# Patient Record
Sex: Female | Born: 1950
Health system: Southern US, Community
[De-identification: ages and names within clinical notes are randomized; demographics above are authoritative.]

## PROBLEM LIST (undated history)

## (undated) DIAGNOSIS — F419 Anxiety disorder, unspecified: Secondary | ICD-10-CM

## (undated) DIAGNOSIS — IMO0002 Reserved for concepts with insufficient information to code with codable children: Secondary | ICD-10-CM

## (undated) DIAGNOSIS — K259 Gastric ulcer, unspecified as acute or chronic, without hemorrhage or perforation: Secondary | ICD-10-CM

## (undated) DIAGNOSIS — E785 Hyperlipidemia, unspecified: Secondary | ICD-10-CM

## (undated) DIAGNOSIS — M810 Age-related osteoporosis without current pathological fracture: Secondary | ICD-10-CM

## (undated) DIAGNOSIS — K219 Gastro-esophageal reflux disease without esophagitis: Secondary | ICD-10-CM

## (undated) DIAGNOSIS — T884XXA Failed or difficult intubation, initial encounter: Secondary | ICD-10-CM

## (undated) HISTORY — PX: ABDOMINAL HYSTERECTOMY: SHX81

## (undated) HISTORY — DX: Reserved for concepts with insufficient information to code with codable children: IMO0002

## (undated) HISTORY — DX: Age-related osteoporosis without current pathological fracture: M81.0

## (undated) HISTORY — PX: PLEURAL SCARIFICATION: SHX748

## (undated) HISTORY — PX: TONSILECTOMY/ADENOIDECTOMY WITH MYRINGOTOMY: SHX6125

## (undated) HISTORY — PX: OTHER SURGICAL HISTORY: SHX169

## (undated) HISTORY — DX: Anxiety disorder, unspecified: F41.9

## (undated) HISTORY — DX: Hyperlipidemia, unspecified: E78.5

## (undated) HISTORY — DX: Gastric ulcer, unspecified as acute or chronic, without hemorrhage or perforation: K25.9

## (undated) HISTORY — DX: Gastro-esophageal reflux disease without esophagitis: K21.9

---

## 1998-04-25 ENCOUNTER — Other Ambulatory Visit: Admission: RE | Admit: 1998-04-25 | Discharge: 1998-04-25 | Payer: Self-pay | Admitting: *Deleted

## 1998-06-11 ENCOUNTER — Other Ambulatory Visit: Admission: RE | Admit: 1998-06-11 | Discharge: 1998-06-11 | Payer: Self-pay | Admitting: *Deleted

## 1998-07-23 ENCOUNTER — Other Ambulatory Visit: Admission: RE | Admit: 1998-07-23 | Discharge: 1998-07-23 | Payer: Self-pay | Admitting: Obstetrics and Gynecology

## 1998-10-29 ENCOUNTER — Other Ambulatory Visit: Admission: RE | Admit: 1998-10-29 | Discharge: 1998-10-29 | Payer: Self-pay | Admitting: Obstetrics and Gynecology

## 1998-12-06 ENCOUNTER — Emergency Department (HOSPITAL_COMMUNITY): Admission: EM | Admit: 1998-12-06 | Discharge: 1998-12-06 | Payer: Self-pay

## 1999-02-05 ENCOUNTER — Other Ambulatory Visit: Admission: RE | Admit: 1999-02-05 | Discharge: 1999-02-05 | Payer: Self-pay | Admitting: *Deleted

## 1999-05-03 ENCOUNTER — Other Ambulatory Visit: Admission: RE | Admit: 1999-05-03 | Discharge: 1999-05-03 | Payer: Self-pay | Admitting: *Deleted

## 1999-11-04 ENCOUNTER — Other Ambulatory Visit: Admission: RE | Admit: 1999-11-04 | Discharge: 1999-11-04 | Payer: Self-pay | Admitting: *Deleted

## 2000-05-04 ENCOUNTER — Other Ambulatory Visit: Admission: RE | Admit: 2000-05-04 | Discharge: 2000-05-04 | Payer: Self-pay | Admitting: Gynecology

## 2001-06-14 ENCOUNTER — Other Ambulatory Visit: Admission: RE | Admit: 2001-06-14 | Discharge: 2001-06-14 | Payer: Self-pay | Admitting: Obstetrics and Gynecology

## 2002-06-29 ENCOUNTER — Other Ambulatory Visit: Admission: RE | Admit: 2002-06-29 | Discharge: 2002-06-29 | Payer: Self-pay | Admitting: Obstetrics and Gynecology

## 2003-07-05 ENCOUNTER — Other Ambulatory Visit: Admission: RE | Admit: 2003-07-05 | Discharge: 2003-07-05 | Payer: Self-pay | Admitting: Obstetrics and Gynecology

## 2003-09-23 DIAGNOSIS — K52831 Collagenous colitis: Secondary | ICD-10-CM | POA: Insufficient documentation

## 2004-07-05 ENCOUNTER — Other Ambulatory Visit: Admission: RE | Admit: 2004-07-05 | Discharge: 2004-07-05 | Payer: Self-pay | Admitting: Obstetrics and Gynecology

## 2004-07-16 ENCOUNTER — Encounter: Admission: RE | Admit: 2004-07-16 | Discharge: 2004-07-16 | Payer: Self-pay | Admitting: Internal Medicine

## 2004-09-22 HISTORY — PX: APPENDECTOMY: SHX54

## 2004-09-27 ENCOUNTER — Encounter: Admission: RE | Admit: 2004-09-27 | Discharge: 2004-09-27 | Payer: Self-pay | Admitting: Gastroenterology

## 2005-03-29 ENCOUNTER — Observation Stay (HOSPITAL_COMMUNITY): Admission: EM | Admit: 2005-03-29 | Discharge: 2005-03-30 | Payer: Self-pay | Admitting: Emergency Medicine

## 2005-03-29 ENCOUNTER — Encounter (INDEPENDENT_AMBULATORY_CARE_PROVIDER_SITE_OTHER): Payer: Self-pay | Admitting: Specialist

## 2005-03-29 ENCOUNTER — Ambulatory Visit (HOSPITAL_COMMUNITY): Admission: RE | Admit: 2005-03-29 | Discharge: 2005-03-29 | Payer: Self-pay | Admitting: Family Medicine

## 2005-07-10 ENCOUNTER — Other Ambulatory Visit: Admission: RE | Admit: 2005-07-10 | Discharge: 2005-07-10 | Payer: Self-pay | Admitting: Obstetrics and Gynecology

## 2007-08-30 ENCOUNTER — Ambulatory Visit (HOSPITAL_COMMUNITY): Admission: RE | Admit: 2007-08-30 | Discharge: 2007-08-31 | Payer: Self-pay | Admitting: Orthopaedic Surgery

## 2008-12-31 ENCOUNTER — Emergency Department (HOSPITAL_BASED_OUTPATIENT_CLINIC_OR_DEPARTMENT_OTHER): Admission: EM | Admit: 2008-12-31 | Discharge: 2008-12-31 | Payer: Self-pay | Admitting: Emergency Medicine

## 2009-05-16 ENCOUNTER — Encounter: Admission: RE | Admit: 2009-05-16 | Discharge: 2009-05-16 | Payer: Self-pay | Admitting: Gastroenterology

## 2010-10-14 ENCOUNTER — Encounter: Payer: Self-pay | Admitting: Gastroenterology

## 2010-11-29 ENCOUNTER — Other Ambulatory Visit: Payer: Self-pay | Admitting: Obstetrics and Gynecology

## 2011-02-04 NOTE — Op Note (Signed)
NAME:  [name], [name]                   ACCOUNT NO.:  [id number]   MEDICAL RECORD NO.:  [id number]          PATIENT TYPE:  AMB   LOCATION:  SDS                          FACILITY:  [facility]   PHYSICIAN:  Mark C. Ophelia Charter, M.D.    DATE OF BIRTH:  1951-07-31   DATE OF PROCEDURE:  08/30/2007  DATE OF DISCHARGE:                               OPERATIVE REPORT   PREOPERATIVE DIAGNOSES:  1. Cervical spondylosis, C5-6.  2. Left wrist ganglion.   POSTOPERATIVE DIAGNOSES:  1. Cervical spondylosis, C5-6.  2. Left wrist ganglion.   PROCEDURES:  1. C5-6 anterior cervical diskectomy and fusion.  2. Left iliac crest autograft.  3. Left wrist ganglion excision.   SURGEON:  Mark C. Ophelia Charter, MD   ASSISTANT:  Maud Deed, PA-C   ANESTHESIA:  GOT plus Marcaine local.   PROCEDURE:  After the induction of general anesthesia, a 2-pound sandbag  behind the neck, gel bag behind the iliac crest, arms tucked to the  side, the neck and iliac crest were prepped with DuraPrep.  Head halter  traction had been applied without weight.  The area was squared with  towels and a sterile skin marker was used prior to Betadine Vi-Drape  application.  Iliac crest was squared with towels and covered with Vi-  Drape, a sterile Mayo stand at the head, thyroid sheet and drape.  Incision was made starting at the midline after a time-out procedure.  The patient received preoperative Ancef prophylaxis.  Incision started  in the midline extending to the left in a skin fold.  She did not have a  really prominent skin fold.  Platysma was divided line with the fibers.  Blunt dissection down just below the omohyoid with a needle placed in  the expected C5-6 level, which turned out to be C6-7.  Looking above, we  did identify the omohyoid at that time, slipped above it, took another x-  ray and confirmed the needle was at C5-6, and then proceed with the  microdiskectomy using the operating microscope, drilling with a Cloward  drill,  first with a 10, then a 12-mm drill, back to posterior cortex.  Trimming removed the bone with a small shelf to prevent posterior  migration.  The operating microscope was used for visualization.  Microdissection techniques were used to take down the posterior  longitudinal ligament.  There were bone spurs in both gutters, some  overhang of C5 over C6 posteriorly, which was causing some displacement  of the dura but no extruded fragments.  Once spurs were removed and the  dura was completely decompressed, uncovertebral joints were stripped.  A  small incision was made over the inferior aspect of the left iliac crest  and fascia was split in line with the fibers and a Cobb was used to  clean this up over the periosteum end.  A 14-mm plug was harvested,  bullet-nosed slightly, based on depth gauge measurements and then  inserted.  A small portion superior on the right had some cortical bone  slightly prominent.  This was trimmed back with a Frankey Poot  rongeur.  The  plug was stable.  After irrigation with saline solution, a Hemovac was  placed through a separate stab incision, in-and-out technique in line  with the skin incision, 3-0 Vicryl in the subcutaneous tissue, followed  by 4-0 Vicryl subcuticular.  Tincture of Benzoin, Steri-Strips, 4x4 and  tape were applied.  Iliac crest closed with 0 Vicryl, 2-0 Vicryl and 4-0  Vicryl in the skin.  Tincture of Benzoin, Steri-Strips, Marcaine  infiltration of both areas.  Arm was then placed on an arm board and the  left proximal arm tourniquet was applied.  Blood pressure cuff was  switched to the opposite right arm in exchange for the tourniquet.  Arm  was prepped with DuraPrep, extremity and sheet drape were applied, time-  out procedure was repeated again, and there was no reason to repeat the  antibiotics since it had just been 2 hours.  Arm was wrapped with an  Esmarch, tourniquet inflated.  Incision was made transversely over the  primary  ganglion, which was in the snuffbox.  EPL tendon was identified,  and the ganglion extended down from the dorsal capsule.  Initial skin  was spread in the midline and then blunt retractors placed to avoid  damaging any of the dorsal radial sensory branches.  A small portion of  the ganglion came out of the base of the wrist capsule, which was  removed.  Wound was irrigated, tourniquet deflated.  One small little  vein was coagulated.  Radial artery was not visualized in the field.  Closure was with 4-0 subcuticular Vicryl.  Tincture of Benzoin, Steri-  Strips, 4x4s, Webril and Ace wrap postoperatively.  Instrument count and  needle count was correct.  The patient was transferred to the recovery  room in stable condition.      Mark C. Ophelia Charter, M.D.  Electronically Signed     MCY/MEDQ  D:  08/30/2007  T:  08/31/2007  Job:  [id number]

## 2011-02-07 NOTE — H&P (Signed)
[name], [name]                   ACCOUNT NO.:  [id number]   MEDICAL RECORD NO.:  [id number]          PATIENT TYPE:  OBV   LOCATION:  0101                         FACILITY:  [facility]   PHYSICIAN:  Angelia Mould. Derrell Lolling, M.D.DATE OF BIRTH:  [birth year]   DATE OF ADMISSION:  03/29/2005  DATE OF DISCHARGE:                                HISTORY & PHYSICAL   CHIEF COMPLAINT:  Abdominal pain.   HISTORY OF PRESENT ILLNESS:  This is a 60 year old white female sent to me  by the courtesy of Dr. Tracey Harries for evaluation of probable appendicitis.   The patient states that she developed abdominal pain 48 hours ago. This was  burning and cramping in nature and not particularly localized. She had  minimal nausea. Twenty-four hours ago the nausea had resolved but she was  more tender and noticed the pain more on the right side in the right lower  quadrant. She has not had any fever or chills. She denies diarrhea or  alteration in her bowel habits. She was able to eat a little bit of  breakfast this morning. She states that her pain is a little bit better this  morning but she still has significant pain. She saw Dr. Tracey Harries  yesterday. He started her on antibiotics yesterday. He did a pelvic exam but  did not find anything. A CT scan was done at [facility] today which shows a  thickened, inflamed appendix consistent with a non ruptured appendicitis. I  was asked to see her at that point.   Notably her past history is that she had a screening colonoscopy about 8 or  12 months ago with Dr. Sharrell Ku and was told that she had colitis.  She was asymptomatic at that point. Five months ago she had some abdominal  pain and was placed on Asacol for 2 weeks and her symptoms resolved. She has  not required any further intervention. She does not have any urologic  problems. Has not had any hepatic problems.   She is admitted for management of her presumed appendicitis.   PAST MEDICAL HISTORY:  1.   Laparoscopy in 1988 by Dr. Teodora Medici when she was told she had      endometriosis.  2.  Colonoscopy 8 to 12 months ago.  3.  Tonsillectomy at age 41.  4.  Right pneumothorax in 1994. At that time she was abusing crack cocaine.      She has been drug free since that time. She does not know, but thinks      that she might have fallen.  5.  She had surgery on her right thumb for a trigger finger.  6.  She has had some reflux problems but nothing major.   CURRENT MEDICATIONS:  Birth control pills. Multivitamins. Some type of  antibiotics but just for 24 hours.   ALLERGIES:  No known drug allergies.   SOCIAL HISTORY:  She lives in [place], is married. They have no children.  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 Denies the use of alcohol or tobacco. She  used crack cocaine but has been drug free for 11 years.   FAMILY HISTORY:  Mother living, has history of breast cancer, is doing well.  Father deceased, had myelodysplasia and probable leukemia. On brother living  with hypertension.   REVIEW OF SYSTEMS:  All systems reviewed. They are noncontributory except as  described above. It is notable that she had her last menstrual period last  week.   PHYSICAL EXAMINATION:  GENERAL:  A thin, pleasant middle-aged woman in no  distress. She is a little bit anxious but very cooperative and appropriate.  VITAL SIGNS:  Temperature 97.5, pulse 78, respirations 16, blood pressure  156/86.  EYES:  Sclerae clear. Extraocular movements are intact.  EARS/NOSE/MOUTH/THROAT:  Nose, lips, tongue and oropharynx are without  lesions.  NECK:  Supple, nontender. No mass.  LUNGS:  Clear to auscultation. No chest wall tenderness.  BREASTS:  Not examined.  HEART:  Regular rate and rhythm, no murmur. Radial, femoral and posterior  tibial pulses are palpable. No peripheral edema.  ABDOMEN:  Soft. Hypoactive bowel sounds. Not distended. Liver and  spleen not  enlarged. No palpable mass. Tender with some guarding in the right lower  quadrant but no rebound tenderness.  GENITOURINARY:  No inguinal adenopathy or hernia.  PELVIC EXAM:  Not repeated.  EXTREMITIES:  She moves all four extremities well without pain or deformity.  NEUROLOGIC:  No gross motor or sensory deficits.   ADMISSION DATA:  White blood cell count is 11,900, hemoglobin 13.8.  Urinalysis unremarkable. Complete metabolic panel unremarkable.   ASSESSMENT:  1.  Acute appendicitis.  2.  Remote history of crack cocaine abuse.  3.  Nonspecific colitis by history.   PLAN:  1.  The patient will need to go to the operating room for diagnostic      laparoscopy and appendectomy.  2.  I have discussed the indications and details of surgery with the patient      and her husband. I have told them that the differential diagnosis      includes appendicitis, malignancy of the appendix, Crohn's disease, or      diverticulitis in general. They seem to understand these issues well. I      told them that the risk and complications of surgery are bleeding,      infection, conversion to open laparotomy, injury to adjacent organs with      major reconstructive      surgery, wound problems such as infection or hernia, cardiac or      pulmonary  and thromboembolic problems. They seem to understand these      issues well. At this time all their questions were answered. They are      understanding and willing to go ahead with this procedure.       HMI/MEDQ  D:  03/29/2005  T:  03/29/2005  Job:  [id number]   cc:   Griffith Citron, M.D.  [facility] [place]  [place] [address]  Fax: [phone]   Leatha Gilding. Mezer, M.D.  [address]  [place]  [place] [address]  Fax: [phone]   Tracey Harries, M.D.  [address]  [place]  [place] [address]  Fax: [phone]

## 2011-02-07 NOTE — Op Note (Signed)
NAMEJONELLE, [name]                   ACCOUNT NO.:  [id number]   MEDICAL RECORD NO.:  [id number]          PATIENT TYPE:  OBV   LOCATION:  0101                         FACILITY:  [facility]   PHYSICIAN:  Angelia Mould. Derrell Lolling, M.D.DATE OF BIRTH:  [birth year]   DATE OF PROCEDURE:  DATE OF DISCHARGE:                                 OPERATIVE REPORT   PREOPERATIVE DIAGNOSIS:  Acute appendicitis.   POSTOPERATIVE DIAGNOSIS:  Acute appendicitis.   OPERATION PERFORMED:  Laparoscopic appendectomy.   SURGEON:  Angelia Mould. Derrell Lolling, M.D.   OPERATIVE INDICATIONS:  This is a 60 year old white female who has a 48-hour  history of abdominal pain which has become more localized to the right lower  quadrant. This has not been very progressive and she actually states she  feels a little bit better today then yesterday. There has been minimal  nausea and no vomiting and no change in her bowel habits. She was started on  antibiotics last night empirically by Dr. Tracey Harries. A CT scan was done  this morning at [facility] and showed a thickened inflamed appendix but no  evidence of rupture. She is brought to operating room following the CT scan.   FINDINGS:  The appendix appeared to be acutely inflamed in its midportion  and distal portion. It was a little bit bumpy in appearance on the distal  tip but this did not look like a tumor. It was fairly stuck to the cecum but  I was able to tease this off with blunt dissection and carefully using the  harmonic scalpel. The right colon looked normal. The last three or four feet  of the terminal ileum looked normal. The liver looked normal. The uterus and  both ovaries looked normal although there was some evidence of endometriosis  on the posterior wall of the bladder.  I took a picture of this, a few small  spots of endometriosis consistent with a past history.   OPERATIVE TECHNIQUE:  Following induction of general endotracheal anesthesia  the patient was identified.  The abdomen was prepped and draped in sterile  fashion following insertion of a Foley catheter. Intravenous antibiotics  were given, 0.5% Marcaine with epinephrine was used as local infiltration  anesthetic. An 11-mm OptiVu port was inserted in the left paramedian area  just above the level of the umbilicus. This insertion was atraumatic.  Pneumoperitoneum was created.  The video camera was inserted the entire  abdomen and pelvis was surveyed with findings as described above. There was  no evidence of injury from the trocar. A 5 mm trocar was placed in the left  mid abdomen and 12 mm trocar placed in the left suprapubic area.   With the patient positioned we were able to identify the base of the  appendix, the body of the appendix and the tip of the appendix curving  laterally. We had to use the harmonic scalpel to carefully divide the  lateral peritoneal attachments to mobilize this medially. The tip of the  appendix was elevated and we had to very carefully dissect this away from  the cecum with blunt spreading dissection but that went well and the  appendix remained intact. We took down the appendiceal artery and  appendiceal mesentery using the harmonic scalpel until we had the appendix  completely skeletonized. We then inserted an Endo-GIA stapler, placed this  transversely across the base of the appendix at the level the cecum, closed  the stapler, held in place for 45 seconds, and fired it and removed it. The  appendix was placed in the specimen bag and removed from the abdominal  cavity.  Staple line was inspected and looked quite good. The operative field was  then irrigated a little bit but there was no bleeding and no significant  purulence or exudate. All the irrigation fluid was removed.   The trocars were removed under direct vision. There was no bleeding from  trocar sites. The pneumoperitoneum was released. The fascia and the  suprapubic port was closed with zero Vicryl  sutures. Incisions were  irrigated with saline and skin closed with subcuticular sutures of 4-0  Monocryl and Steri-Strips. Clean bandages were placed and the patient taken  recovery room in stable condition. Estimated blood loss was about 10 mL.   COMPLICATIONS:  None.   Sponge and instruments counts were correct.       HMI/MEDQ  D:  03/29/2005  T:  03/29/2005  Job:  [id number]   cc:   Griffith Citron, M.D.  [facility] [place]  [place] [address]  Fax: [phone]   Leatha Gilding. Mezer, M.D.  [address]  [place]  [place] [address]  Fax: [phone]   Tracey Harries, M.D.  [address]  [place]  [place] [address]  Fax: [phone]

## 2011-06-30 LAB — COMPREHENSIVE METABOLIC PANEL
ALT: 19
AST: 23
CO2: 25
Chloride: 105
GFR calc Af Amer: >60
GFR calc non Af Amer: >60
Sodium: 138
Total Bilirubin: 0.5

## 2011-06-30 LAB — DIFFERENTIAL
Basophils Absolute: 0.1
Eosinophils Absolute: 0.2
Eosinophils Relative: 2
Lymphs Abs: 3
Monocytes Absolute: 0.8

## 2011-06-30 LAB — URINALYSIS, ROUTINE W REFLEX MICROSCOPIC
Glucose, UA: NEGATIVE
Hgb urine dipstick: NEGATIVE
Protein, ur: NEGATIVE
pH: 7

## 2011-06-30 LAB — CBC
RBC: 4.56
WBC: 9.7

## 2011-06-30 LAB — PROTIME-INR: Prothrombin Time: 13

## 2011-08-29 ENCOUNTER — Ambulatory Visit (INDEPENDENT_AMBULATORY_CARE_PROVIDER_SITE_OTHER): Payer: No Typology Code available for payment source

## 2011-08-29 DIAGNOSIS — E7800 Pure hypercholesterolemia, unspecified: Secondary | ICD-10-CM

## 2011-09-02 ENCOUNTER — Ambulatory Visit: Payer: Self-pay | Admitting: Emergency Medicine

## 2011-10-10 ENCOUNTER — Ambulatory Visit (INDEPENDENT_AMBULATORY_CARE_PROVIDER_SITE_OTHER): Payer: No Typology Code available for payment source

## 2011-10-10 DIAGNOSIS — R5383 Other fatigue: Secondary | ICD-10-CM

## 2011-10-10 DIAGNOSIS — R110 Nausea: Secondary | ICD-10-CM

## 2012-06-07 ENCOUNTER — Ambulatory Visit (INDEPENDENT_AMBULATORY_CARE_PROVIDER_SITE_OTHER): Payer: No Typology Code available for payment source | Admitting: Family Medicine

## 2012-06-07 VITALS — BP 110/60 | HR 83 | Temp 97.9°F | Resp 16 | Ht 64.0 in | Wt 122.0 lb

## 2012-06-07 DIAGNOSIS — L0390 Cellulitis, unspecified: Secondary | ICD-10-CM

## 2012-06-07 DIAGNOSIS — Z0000 Encounter for general adult medical examination without abnormal findings: Secondary | ICD-10-CM

## 2012-06-07 DIAGNOSIS — Z23 Encounter for immunization: Secondary | ICD-10-CM

## 2012-06-07 DIAGNOSIS — L0291 Cutaneous abscess, unspecified: Secondary | ICD-10-CM

## 2012-06-07 MED ORDER — DOXYCYCLINE HYCLATE 100 MG PO TABS: 100.0000 mg | ORAL_TABLET | Freq: Two times a day (BID) | ORAL | Status: DC

## 2012-06-07 NOTE — Progress Notes (Signed)
61 yo woman with 24 hours of itchy red rash right dorsal and ulnar hand.  She has been using cortaid all day today to control the itching.  She suffered a painful bee sting 8 days ago.  Cannot  Bend pinky finger easily  O:  Red and slightly swollen MCP joint area with FROM.  Assessment:  Cellulitis right lateral hand

## 2013-09-20 ENCOUNTER — Encounter: Payer: No Typology Code available for payment source | Admitting: Emergency Medicine

## 2013-09-27 ENCOUNTER — Ambulatory Visit (INDEPENDENT_AMBULATORY_CARE_PROVIDER_SITE_OTHER): Payer: PRIVATE HEALTH INSURANCE | Admitting: Emergency Medicine

## 2013-09-27 ENCOUNTER — Encounter: Payer: Self-pay | Admitting: Emergency Medicine

## 2013-09-27 VITALS — BP 126/70 | HR 60 | Temp 98.5°F | Resp 16 | Ht 63.5 in | Wt 118.2 lb

## 2013-09-27 DIAGNOSIS — Z23 Encounter for immunization: Secondary | ICD-10-CM

## 2013-09-27 DIAGNOSIS — R9431 Abnormal electrocardiogram [ECG] [EKG]: Secondary | ICD-10-CM | POA: Insufficient documentation

## 2013-09-27 DIAGNOSIS — E785 Hyperlipidemia, unspecified: Secondary | ICD-10-CM

## 2013-09-27 DIAGNOSIS — F17200 Nicotine dependence, unspecified, uncomplicated: Secondary | ICD-10-CM

## 2013-09-27 DIAGNOSIS — Z0000 Encounter for general adult medical examination without abnormal findings: Secondary | ICD-10-CM

## 2013-09-27 LAB — CBC WITH DIFFERENTIAL/PLATELET
BASOS ABS: 0 10*3/uL (ref 0.0–0.1)
BASOS PCT: 0 % (ref 0–1)
EOS ABS: 0.1 10*3/uL (ref 0.0–0.7)
EOS PCT: 2 % (ref 0–5)
HCT: 40.2 % (ref 36.0–46.0)
Hemoglobin: 13.5 g/dL (ref 12.0–15.0)
Lymphocytes Relative: 30 % (ref 12–46)
Lymphs Abs: 2.1 10*3/uL (ref 0.7–4.0)
MCH: 28 pg (ref 26.0–34.0)
MCHC: 33.6 g/dL (ref 30.0–36.0)
MCV: 83.2 fL (ref 78.0–100.0)
Monocytes Absolute: 0.7 10*3/uL (ref 0.1–1.0)
Monocytes Relative: 10 % (ref 3–12)
Neutro Abs: 4.2 10*3/uL (ref 1.7–7.7)
Neutrophils Relative %: 58 % (ref 43–77)
PLATELETS: 346 10*3/uL (ref 150–400)
RBC: 4.83 MIL/uL (ref 3.87–5.11)
RDW: 14.3 % (ref 11.5–15.5)
WBC: 7.2 10*3/uL (ref 4.0–10.5)

## 2013-09-27 LAB — POCT URINALYSIS DIPSTICK
Bilirubin, UA: NEGATIVE
Glucose, UA: NEGATIVE
Ketones, UA: NEGATIVE
Nitrite, UA: NEGATIVE
PROTEIN UA: NEGATIVE
RBC UA: NEGATIVE
SPEC GRAV UA: 1.01
UROBILINOGEN UA: 0.2
pH, UA: 7

## 2013-09-27 MED ORDER — ZOSTER VACCINE LIVE 19400 UNT/0.65ML ~~LOC~~ SOLR: 0.6500 mL | Freq: Once | SUBCUTANEOUS | Status: DC

## 2013-09-27 NOTE — Progress Notes (Signed)
   Subjective:    Patient ID: [name], female    DOB: 08-05-1951, 63 y.o.   MRN: [id number]  HPI    Review of Systems  Constitutional: Negative.   HENT: Negative.   Eyes: Negative.   Respiratory: Negative.   Cardiovascular: Negative.   Gastrointestinal: Negative.   Endocrine: Negative.   Genitourinary: Positive for frequency and dyspareunia.  Musculoskeletal: Negative.   Skin: Negative.   Allergic/Immunologic: Negative.   Neurological: Negative.   Hematological: Negative.   Psychiatric/Behavioral: Positive for sleep disturbance.       Objective:   Physical Exam HEENT exam is unremarkable. Her neck is supple. Her chest is clear to auscultation and percussion. Heart is a regular rate without murmurs rubs or gallops. The abdomen is soft liver and spleen are not enlarged no areas of tenderness no masses are felt. Skin is normal without lesions seen. Extremities without cyanosis clubbing or edema. Neurological intact. Breast exam reveals no masses Results for orders placed in visit on 09/27/13  POCT URINALYSIS DIPSTICK      Result Value Range   Color, UA yellow     Clarity, UA clear     Glucose, UA neg     Bilirubin, UA neg     Ketones, UA neg     Spec Grav, UA 1.010     Blood, UA neg     pH, UA 7.0     Protein, UA neg     Urobilinogen, UA 0.2     Nitrite, UA neg     Leukocytes, UA Trace           Assessment & Plan:  Routine labs were done. She is due to see her GYN specialist in March. She has regular mammograms done. Will order a carotid Doppler since her brother just had a stroke secondary to carotid stenosis . She has a family history of breast cancer.

## 2013-09-28 LAB — COMPLETE METABOLIC PANEL WITH GFR
ALT: 22 U/L (ref 0–35)
AST: 23 U/L (ref 0–37)
Albumin: 4.5 g/dL (ref 3.5–5.2)
Alkaline Phosphatase: 78 U/L (ref 39–117)
BILIRUBIN TOTAL: 0.5 mg/dL (ref 0.3–1.2)
BUN: 14 mg/dL (ref 6–23)
CO2: 29 meq/L (ref 19–32)
CREATININE: 0.61 mg/dL (ref 0.50–1.10)
Calcium: 9.7 mg/dL (ref 8.4–10.5)
Chloride: 100 mEq/L (ref 96–112)
GFR, Est Non African American: >89 mL/min
Glucose, Bld: 89 mg/dL (ref 70–99)
Potassium: 4.8 mEq/L (ref 3.5–5.3)
Sodium: 137 mEq/L (ref 135–145)
Total Protein: 7.5 g/dL (ref 6.0–8.3)

## 2013-09-28 LAB — LIPID PANEL
CHOLESTEROL: 220 mg/dL — AB (ref 0–200)
HDL: 71 mg/dL (ref >39)
LDL Cholesterol: 132 mg/dL — ABNORMAL HIGH (ref 0–99)
TRIGLYCERIDES: 83 mg/dL (ref <150)
Total CHOL/HDL Ratio: 3.1 Ratio
VLDL: 17 mg/dL (ref 0–40)

## 2013-09-28 LAB — TSH: TSH: 2.066 u[IU]/mL (ref 0.350–4.500)

## 2013-12-29 ENCOUNTER — Ambulatory Visit
Admission: RE | Admit: 2013-12-29 | Discharge: 2013-12-29 | Disposition: A | Discharge status: Home / Self Care | Payer: PRIVATE HEALTH INSURANCE | Source: Ambulatory Visit | Attending: Emergency Medicine | Admitting: Emergency Medicine

## 2013-12-29 DIAGNOSIS — F17200 Nicotine dependence, unspecified, uncomplicated: Secondary | ICD-10-CM

## 2014-01-04 ENCOUNTER — Other Ambulatory Visit: Payer: Self-pay

## 2014-07-10 ENCOUNTER — Ambulatory Visit (INDEPENDENT_AMBULATORY_CARE_PROVIDER_SITE_OTHER): Payer: PRIVATE HEALTH INSURANCE | Admitting: Physician Assistant

## 2014-07-10 VITALS — BP 126/74 | HR 69 | Temp 98.2°F | Resp 16 | Ht 63.5 in | Wt 121.0 lb

## 2014-07-10 DIAGNOSIS — H6123 Impacted cerumen, bilateral: Secondary | ICD-10-CM

## 2014-07-10 DIAGNOSIS — R0982 Postnasal drip: Secondary | ICD-10-CM

## 2014-07-10 DIAGNOSIS — R05 Cough: Secondary | ICD-10-CM

## 2014-07-10 DIAGNOSIS — R059 Cough, unspecified: Secondary | ICD-10-CM

## 2014-07-10 MED ORDER — IPRATROPIUM BROMIDE 0.03 % NA SOLN: 2.0000 | Freq: Two times a day (BID) | NASAL | Status: DC

## 2014-07-10 NOTE — Progress Notes (Signed)
Subjective:    Patient ID: [name], female    DOB: 1950/09/28, 63 y.o.   MRN: [id number]  Cough Pertinent negatives include no chills, ear pain, fever, sore throat, shortness of breath or wheezing. There is no history of environmental allergies.    This is a 63 year old woman presenting with a cough x 3 weeks. She reports three weeks ago she had a "normal cold". She was experiencing sore throat, nasal congestion and cough. Her sore throat and nasal congestion resolved after 3 days, however her cough has lingered. The cough is dry. The cough is worse in the evening and in the mornings. In the mornings she feels there is mucus stuck in her throat. The cough is not keeping her awake at night. She reports her voice feels tired. She tried mucinex one day but otherwise has not tried anything. She denies fever, chills, wheezing or shortness of breath. Tried mucinex one day. She does not have asthma. She is a former smoker - quit 18 years ago. She does not have environmental allergies. She reports the globus sensation and tired voice are symptoms she has had in the past with acid reflux induced cough. This is somewhat different though - before her symptoms were aggravated with laying down and now her symptoms are relieved with laying down.   She reports her ears frequently get "stopped up". She uses a warm washcloth to clean her ears, no q-tips. She feels she has a hard time hearing sometimes.  Review of Systems  Constitutional: Negative for fever and chills.  HENT: Positive for hearing loss. Negative for congestion, ear pain and sore throat.   Eyes: Negative for discharge and itching.  Respiratory: Positive for cough. Negative for shortness of breath and wheezing.   Skin: Negative.   Allergic/Immunologic: Negative for environmental allergies.   Current Outpatient Prescriptions on File Prior to Visit  Medication Sig Dispense Refill  . aspirin 81 MG tablet Take 81 mg by mouth daily.      .  Multiple Vitamins-Minerals (MULTIVITAMIN WITH MINERALS) tablet Take 1 tablet by mouth daily.      . Omega-3 Fatty Acids (FISH OIL) 1000 MG CAPS Take by mouth.      Marland Kitchen omeprazole (PRILOSEC) 20 MG capsule Take 20 mg by mouth daily.      Marland Kitchen zoster vaccine live, PF, (ZOSTAVAX) 56387 UNT/0.65ML injection Inject 19,400 Units into the skin once.  1 each  0   No current facility-administered medications on file prior to visit.   Patient Active Problem List   Diagnosis Date Noted  . Nonspecific abnormal electrocardiogram (ECG) (EKG) 09/27/2013      Objective:   Physical Exam  Constitutional: She is oriented to person, place, and time. She appears well-developed and well-nourished. No distress.  HENT:  Head: Normocephalic and atraumatic.  Right Ear: Hearing normal.  Left Ear: Hearing normal.  Nose: Nose normal.  Mouth/Throat: Uvula is midline and mucous membranes are normal. Posterior oropharyngeal erythema present. No oropharyngeal exudate.  Bilateral TMs blocked by cerumen. Ear lavage with colace performed. After lavage, canals and TMs clear.  Eyes: Conjunctivae and lids are normal. Right eye exhibits no discharge. Left eye exhibits no discharge. No scleral icterus.  Cardiovascular: Normal rate, regular rhythm and normal heart sounds.   Pulmonary/Chest: Effort normal and breath sounds normal. No respiratory distress. She has no wheezes. She has no rhonchi. She has no rales.  Lymphadenopathy:    She has no cervical adenopathy.  Neurological: She is  alert and oriented to person, place, and time.  Skin: Skin is warm and dry. She is not diaphoretic.       Assessment & Plan:  1. Postnasal drip 2. Cough  Patient likely has postnasal drip, as supported by a recent URI and sense of mucus in throat worse in the mornings. Cannot rule out laryngopharyngeal reflux, especially since she has had this before. Will treat with atrovent nasal spray and mucinex. If she is not improved in 2 weeks she will  return for further evaluation.  3. Cerumen impaction, bilateral Resolved with ear irrigation. Softened cerumen with colace. TMs clear.   Benjaman Pott Drenda Freeze, M[facility]  07/10/2014

## 2014-07-10 NOTE — Progress Notes (Signed)
I have discussed this patient with Ms. Drenda Freeze and agree.

## 2014-07-10 NOTE — Patient Instructions (Signed)
Use mucinex two times daily for 1 week. Use atrovent for next 2 weeks. If no improvement, return for further evaluation.

## 2014-08-14 ENCOUNTER — Ambulatory Visit (INDEPENDENT_AMBULATORY_CARE_PROVIDER_SITE_OTHER): Payer: PRIVATE HEALTH INSURANCE

## 2014-08-14 DIAGNOSIS — Z23 Encounter for immunization: Secondary | ICD-10-CM

## 2014-11-06 ENCOUNTER — Ambulatory Visit (INDEPENDENT_AMBULATORY_CARE_PROVIDER_SITE_OTHER): Payer: PRIVATE HEALTH INSURANCE | Admitting: Internal Medicine

## 2014-11-06 VITALS — BP 112/68 | HR 61 | Temp 97.6°F | Resp 16 | Ht 63.5 in | Wt 122.0 lb

## 2014-11-06 DIAGNOSIS — H44002 Unspecified purulent endophthalmitis, left eye: Secondary | ICD-10-CM

## 2014-11-06 MED ORDER — DOXYCYCLINE HYCLATE 100 MG PO CAPS: 100.0000 mg | ORAL_CAPSULE | Freq: Two times a day (BID) | ORAL | Status: AC

## 2014-11-06 MED ORDER — ERYTHROMYCIN 5 MG/GM OP OINT: 1.0000 "application " | TOPICAL_OINTMENT | Freq: Every day | OPHTHALMIC | Status: DC

## 2014-11-06 MED ORDER — ERYTHROMYCIN 5 MG/GM OP OINT: 1.0000 "application " | TOPICAL_OINTMENT | Freq: Three times a day (TID) | OPHTHALMIC | Status: DC

## 2014-11-06 NOTE — Patient Instructions (Addendum)
You can use this three times per day.  Gently massage the bottom eyelid.  Use warm compresses 4 times per day,  For about 15 minutes.    If this continues to grow, and does not resolve, please return or contact us.

## 2014-11-06 NOTE — Progress Notes (Signed)
[facility] [address], [place] [address] [phone]  Date:  11/06/2014   Name:  [name]   DOB:  03-31-1951   MRN:  [id number]  PCP:  Jenny Reichmann, MD    Chief Complaint: Eye Problem   History of Present Illness:  [name] is a 64 y.o. very pleasant female patient who presents with the following: Yesterday, she felt tenderness beneath her left eye.  She also noticed itching.  She denies any trouble with eye movement, vision change, no photophobia, no matting of the eye, and without the sensation of foreign body scratching the eye.  He noticed some swelling of the lower eye lid.  She denies any fever. She has tried lubricating drops without much relief.  Last eye exam was 2 weeks ago.  She is a makeup, mascara user, but has refrained today.  She is strictly an eye glass wearer.   Patient Active Problem List   Diagnosis Date Noted  . Nonspecific abnormal electrocardiogram (ECG) (EKG) 09/27/2013    Past Medical History  Diagnosis Date  . GERD (gastroesophageal reflux disease)     Past Surgical History  Procedure Laterality Date  . Appendectomy    . Disc ectomy    . Tonsilectomy/adenoidectomy with myringotomy    . Pleural scarification      History  Substance Use Topics  . Smoking status: Former Smoker    Quit date: 05/23/1996  . Smokeless tobacco: Not on file  . Alcohol Use: No    Family History  Problem Relation Age of Onset  . Cancer Father   . Hyperlipidemia Brother   . Hypertension Brother   . Mental illness Brother   . Cancer Maternal Grandmother   . Cancer Paternal Grandmother   . Cancer Paternal Grandfather     No Known Allergies  Medication list has been reviewed and updated.  Current Outpatient Prescriptions on File Prior to Visit  Medication Sig Dispense Refill  . aspirin 81 MG tablet Take 81 mg by mouth daily.    . Multiple Vitamins-Minerals (MULTIVITAMIN WITH MINERALS) tablet Take 1 tablet by mouth daily.     . Omega-3 Fatty Acids (FISH OIL) 1000 MG CAPS Take by mouth.    Marland Kitchen omeprazole (PRILOSEC) 20 MG capsule Take 20 mg by mouth daily.    Marland Kitchen ipratropium (ATROVENT) 0.03 % nasal spray Place 2 sprays into both nostrils 2 (two) times daily. (Patient not taking: Reported on 11/06/2014) 30 mL 0  . zoster vaccine live, PF, (ZOSTAVAX) 19147 UNT/0.65ML injection Inject 19,400 Units into the skin once. (Patient not taking: Reported on 11/06/2014) 1 each 0   No current facility-administered medications on file prior to visit.    Review of Systems: ROS otherwise unremarkable unless listed above  Physical Examination: Filed Vitals:   11/06/14 1059  BP: 112/68  Pulse: 61  Temp: 97.6 F (36.4 C)  Resp: 16   Filed Vitals:   11/06/14 1059  Height: 5' 3.5" (1.613 m)  Weight: 122 lb (55.339 kg)   Body mass index is 21.27 kg/(m^2). Ideal Body Weight: Weight in (lb) to have BMI = 25: 143.1  Physical Exam  Nursing note and vitals reviewed. Constitutional: Vital signs are normal. She appears well-developed and well-nourished.  HENT:  Head: Normocephalic and atraumatic.  Eyes: EOM are normal. Pupils are equal, round, and reactive to light. Right eye exhibits no discharge. No foreign body present in the right eye. Left eye exhibits no discharge. No  foreign body present in the left eye. Right conjunctiva is not injected. Right conjunctiva has no hemorrhage. Left conjunctiva is not injected. Left conjunctiva has no hemorrhage. Right eye exhibits normal extraocular motion. Left eye exhibits normal extraocular motion.  At the center of left inner eyelid, is erythema with pustule.  Purulence not expressed at this time.  Normal ocular movement.  No abnormalities detected on fundoscopic exam.       Assessment and Plan: 63 year old female with PMH listed above is here today for chief complaint of eye pain.    Infection of left eye - Plan: erythromycin [facility]) ophthalmic ointment, doxycycline (VIBRAMYCIN) 100 MG  capsule -Doxycycline 100 BID for 7 days -Erythromycin ophthalmic ointment apply TID until lesion resolves -She will return if pustule continues to grow.  Ivar Drape, PA-C [facility] 2/15/201611:37 AM

## 2014-12-17 ENCOUNTER — Ambulatory Visit (INDEPENDENT_AMBULATORY_CARE_PROVIDER_SITE_OTHER): Payer: PRIVATE HEALTH INSURANCE | Admitting: Family Medicine

## 2014-12-17 VITALS — BP 130/72 | HR 63 | Temp 97.6°F | Resp 16 | Ht 63.5 in | Wt 122.0 lb

## 2014-12-17 DIAGNOSIS — H00013 Hordeolum externum right eye, unspecified eyelid: Secondary | ICD-10-CM

## 2014-12-17 MED ORDER — TOBRAMYCIN 0.3 % OP SOLN: 1.0000 [drp] | OPHTHALMIC | Status: DC

## 2014-12-17 MED ORDER — DOXYCYCLINE HYCLATE 100 MG PO TABS: 100.0000 mg | ORAL_TABLET | Freq: Two times a day (BID) | ORAL | Status: DC

## 2014-12-17 NOTE — Patient Instructions (Signed)

## 2014-12-17 NOTE — Progress Notes (Signed)
° °  Subjective:    Patient ID: [name], female    DOB: [birth year], 64 y.o.   MRN: [id number] This chart was scribed for Robyn Haber, MD by Marti Sleigh, Medical Scribe. This patient was seen in Room 3 and the patient's care was started a 8:11 AM.  Chief Complaint  Patient presents with   Eye Pain    right eye started on tuesday with drainage and swelling.  has used some stye medication that she was given 1 month ago for the left eye    HPI HPI Comments: [name] is a 64 y.o. female who presents to [facility] complaining of right eye pain with associated swelling for the last five days. Pt states she had some stye medication left over, and put some in her eye yesterday and put a hot compress on her eye last night and when she woke up this morning her eye was more swollen. Pt reported to the [facility] one month ago due to an eye issue in the left eye and was diagnosed with a stye.    Review of Systems  Constitutional: Negative for fever and chills.  Eyes: Positive for pain and redness.       Swelling in eye.  Neurological: Negative for facial asymmetry and headaches.       Objective:   Physical Exam  Constitutional: She is oriented to person, place, and time. She appears well-developed and well-nourished. No distress.  HENT:  Head: Normocephalic and atraumatic.  Eyes: Pupils are equal, round, and reactive to light.  Right upper lid is mildly swollen and mildly erythematous with a palpable nodule in the medial canthus. Lid eversion shows no foreign body but there is crusting along the lid margin medially. There is mild injection of the scleral vessels.  Neck: Neck supple.  Cardiovascular: Normal rate.   Pulmonary/Chest: Effort normal. No respiratory distress.  Musculoskeletal: Normal range of motion.  Neurological: She is alert and oriented to person, place, and time. Coordination normal.  Skin: Skin is warm and dry. She is not diaphoretic.  Psychiatric: She has a normal mood and  affect. Her behavior is normal.  Nursing note and vitals reviewed.      Assessment & Plan:   This chart was scribed in my presence and reviewed by me personally.    ICD-9-CM ICD-10-CM   1. Hordeolum externum, right 373.11 H00.013 doxycycline (VIBRA-TABS) 100 MG tablet     tobramycin (TOBREX) 0.3 % ophthalmic solution   We discussed the possibility of reintroducing staph bacteria into the eyes since she had the same thing on the left side last month. She has changed all of her cosmetics so it's unlikely that's the cause but she does work in [employer] and so is exposed to quite a variety of pathogens.  I explained that it's impossible say why she got this is second time but she needs to be careful about handwashing and pillowcase  Signed, Robyn Haber, MD

## 2015-02-26 ENCOUNTER — Encounter: Payer: Self-pay | Admitting: *Deleted

## 2015-03-05 ENCOUNTER — Telehealth: Payer: Self-pay | Admitting: *Deleted

## 2015-03-05 NOTE — Telephone Encounter (Signed)
Patient phoned & left message in response to my mammo ltr, stating she had just had her mammo completed at her ob/gyn's office and had already reached out to them for them to send the results.  Will update health maintenance once received.

## 2015-03-19 ENCOUNTER — Ambulatory Visit (INDEPENDENT_AMBULATORY_CARE_PROVIDER_SITE_OTHER): Payer: PRIVATE HEALTH INSURANCE

## 2015-03-19 ENCOUNTER — Ambulatory Visit (INDEPENDENT_AMBULATORY_CARE_PROVIDER_SITE_OTHER): Payer: PRIVATE HEALTH INSURANCE | Admitting: Emergency Medicine

## 2015-03-19 VITALS — BP 128/76 | HR 102 | Temp 98.5°F | Resp 20 | Ht 63.75 in | Wt 122.0 lb

## 2015-03-19 DIAGNOSIS — R05 Cough: Secondary | ICD-10-CM

## 2015-03-19 DIAGNOSIS — J0100 Acute maxillary sinusitis, unspecified: Secondary | ICD-10-CM | POA: Diagnosis not present

## 2015-03-19 DIAGNOSIS — R059 Cough, unspecified: Secondary | ICD-10-CM

## 2015-03-19 MED ORDER — FLUTICASONE PROPIONATE 50 MCG/ACT NA SUSP: 2.0000 | Freq: Every day | NASAL | Status: DC

## 2015-03-19 MED ORDER — AMOXICILLIN 875 MG PO TABS: 875.0000 mg | ORAL_TABLET | Freq: Two times a day (BID) | ORAL | Status: DC

## 2015-03-19 NOTE — Progress Notes (Signed)
Subjective:   This chart was scribed for Nena Jordan, MD by Thea Alken, ED Scribe. This patient was seen in room 10 and the patient's care was started at 8:06 AM   Patient ID: [name], female    DOB: [birth year], 64 y.o.   MRN: [id number]  HPI   Chief Complaint  Patient presents with  . Sore Throat    x 1 week   . Nasal Congestion  . Cough   HPI Comments: [name] is a 64 y.o. female who [facility] complaining of sore throat that began 1 week ago. Pt reports symptoms started with a sore throat 1 week ago that progressed with nasal congestion consisting of yellow drainage and cough. She has been able to blow her nose with drainage from right nare but has had difficulty producing drainage from left nare. Pt has tried 2 mucinex gel caps, afrin, ibuprofen and tea without relief to symptoms. Pt has hx of tonsillectomy. Pt has smoked in 20 years.   Past Medical History  Diagnosis Date  . GERD (gastroesophageal reflux disease)    Past Surgical History  Procedure Laterality Date  . Appendectomy    . Disc ectomy    . Tonsilectomy/adenoidectomy with myringotomy    . Pleural scarification     Prior to Admission medications   Medication Sig Start Date End Date Taking? Authorizing Provider  aspirin 81 MG tablet Take 81 mg by mouth daily.    Historical Provider, MD  doxycycline (VIBRA-TABS) 100 MG tablet Take 1 tablet (100 mg total) by mouth 2 (two) times daily. 12/17/14   Robyn Haber, MD  ipratropium (ATROVENT) 0.03 % nasal spray Place 2 sprays into both nostrils 2 (two) times daily. 07/10/14   Ezekiel Slocumb, PA-C  Multiple Vitamins-Minerals (MULTIVITAMIN WITH MINERALS) tablet Take 1 tablet by mouth daily.    Historical Provider, MD  Omega-3 Fatty Acids (FISH OIL) 1000 MG CAPS Take by mouth.    Historical Provider, MD  omeprazole (PRILOSEC) 20 MG capsule Take 20 mg by mouth daily.    Historical Provider, MD  tobramycin (TOBREX) 0.3 % ophthalmic  solution Place 1 drop into the right eye every 4 (four) hours. 12/17/14   Robyn Haber, MD  zoster vaccine live, PF, (ZOSTAVAX) 23557 UNT/0.65ML injection Inject 19,400 Units into the skin once. 09/27/13   Darlyne Russian, MD   Review of Systems  HENT: Positive for congestion and sore throat.   Respiratory: Positive for cough.     Objective:   Physical Exam CONSTITUTIONAL: Well developed/well nourished HEAD: Normocephalic/atraumatic EYES: EOMI/PERRL ENMT: Mucous membranes moist. bilateral nasal congestion. Tenderness over left maxillary sinuses.  NECK: supple no meningeal signs SPINE/BACK:entire spine nontender CV: S1/S2 noted, no murmurs/rubs/gallops noted LUNGS: Lungs are clear to auscultation bilaterally, no apparent distress ABDOMEN: soft, nontender, no rebound or guarding, bowel sounds noted throughout abdomen GU:no cva tenderness NEURO: Pt is awake/alert/appropriate, moves all extremitiesx4.  No facial droop.   EXTREMITIES: pulses normal/equal, full ROM SKIN: warm, color normal PSYCH: no abnormalities of mood noted, alert and oriented to situation  Filed Vitals:   03/19/15 0814  BP: 128/76  Pulse: 102  Temp: 98.5 F (36.9 C)  TempSrc: Oral  Resp: 20  Height: 5' 3.75" (1.619 m)  Weight: 122 lb (55.339 kg)  SpO2: 98%   [facility] reading (PRIMARY) by Dr. Everlene Farrier. There is an air-fluid level in the left maxillary sinus   Assessment & Plan:   Patient  has an acute left maxillary sinusitis. Will treat with amoxicillin and Flonase. Recheck if symptoms persist.I personally performed the services described in this documentation, which was scribed in my presence. The recorded information has been reviewed and is accurate.  Nena Jordan, MD

## 2015-03-21 ENCOUNTER — Encounter: Payer: Self-pay | Admitting: *Deleted

## 2015-03-30 NOTE — Telephone Encounter (Signed)
Upon chart review, mammo report was faxed to medical records and scanned into system on 03/21/15 - mammo completed 01/09/15 - no mammographic evidence of malignancy.  Health maintenance was already updated.  Updated care team to include gyn.

## 2015-06-26 ENCOUNTER — Encounter: Payer: Self-pay | Admitting: Emergency Medicine

## 2015-10-09 ENCOUNTER — Ambulatory Visit (INDEPENDENT_AMBULATORY_CARE_PROVIDER_SITE_OTHER): Payer: PRIVATE HEALTH INSURANCE

## 2015-10-09 ENCOUNTER — Encounter: Payer: Self-pay | Admitting: Emergency Medicine

## 2015-10-09 ENCOUNTER — Ambulatory Visit (INDEPENDENT_AMBULATORY_CARE_PROVIDER_SITE_OTHER): Payer: PRIVATE HEALTH INSURANCE | Admitting: Emergency Medicine

## 2015-10-09 VITALS — BP 117/74 | HR 64 | Temp 97.5°F | Resp 16 | Ht 63.75 in | Wt 121.0 lb

## 2015-10-09 DIAGNOSIS — Z0000 Encounter for general adult medical examination without abnormal findings: Secondary | ICD-10-CM

## 2015-10-09 DIAGNOSIS — M79645 Pain in left finger(s): Secondary | ICD-10-CM

## 2015-10-09 DIAGNOSIS — Z13220 Encounter for screening for lipoid disorders: Secondary | ICD-10-CM

## 2015-10-09 DIAGNOSIS — Z114 Encounter for screening for human immunodeficiency virus [HIV]: Secondary | ICD-10-CM | POA: Diagnosis not present

## 2015-10-09 DIAGNOSIS — Z1159 Encounter for screening for other viral diseases: Secondary | ICD-10-CM

## 2015-10-09 DIAGNOSIS — Z23 Encounter for immunization: Secondary | ICD-10-CM

## 2015-10-09 DIAGNOSIS — Z780 Asymptomatic menopausal state: Secondary | ICD-10-CM | POA: Diagnosis not present

## 2015-10-09 DIAGNOSIS — M25572 Pain in left ankle and joints of left foot: Secondary | ICD-10-CM

## 2015-10-09 LAB — HIV ANTIBODY (ROUTINE TESTING W REFLEX): HIV: NONREACTIVE

## 2015-10-09 LAB — CBC WITH DIFFERENTIAL/PLATELET
BASOS ABS: 0 10*3/uL (ref 0.0–0.1)
Basophils Relative: 0 % (ref 0–1)
EOS PCT: 2 % (ref 0–5)
Eosinophils Absolute: 0.1 10*3/uL (ref 0.0–0.7)
HEMATOCRIT: 39.5 % (ref 36.0–46.0)
Hemoglobin: 13.8 g/dL (ref 12.0–15.0)
LYMPHS ABS: 2.1 10*3/uL (ref 0.7–4.0)
LYMPHS PCT: 30 % (ref 12–46)
MCH: 29 pg (ref 26.0–34.0)
MCHC: 34.9 g/dL (ref 30.0–36.0)
MCV: 83 fL (ref 78.0–100.0)
MONOS PCT: 9 % (ref 3–12)
MPV: 9 fL (ref 8.6–12.4)
Monocytes Absolute: 0.6 10*3/uL (ref 0.1–1.0)
Neutro Abs: 4.1 10*3/uL (ref 1.7–7.7)
Neutrophils Relative %: 59 % (ref 43–77)
Platelets: 319 10*3/uL (ref 150–400)
RBC: 4.76 MIL/uL (ref 3.87–5.11)
RDW: 14.2 % (ref 11.5–15.5)
WBC: 6.9 10*3/uL (ref 4.0–10.5)

## 2015-10-09 LAB — POC MICROSCOPIC URINALYSIS (UMFC): Mucus: ABSENT

## 2015-10-09 LAB — POCT URINALYSIS DIP (MANUAL ENTRY)
BILIRUBIN UA: NEGATIVE
Bilirubin, UA: NEGATIVE
Blood, UA: NEGATIVE
Glucose, UA: NEGATIVE
LEUKOCYTES UA: NEGATIVE
NITRITE UA: NEGATIVE
PH UA: 7
PROTEIN UA: NEGATIVE
Spec Grav, UA: 1.01
UROBILINOGEN UA: 0.2

## 2015-10-09 LAB — LIPID PANEL
CHOL/HDL RATIO: 2.9 ratio (ref <=5.0)
Cholesterol: 227 mg/dL — ABNORMAL HIGH (ref 125–200)
HDL: 78 mg/dL (ref >=46)
LDL CALC: 132 mg/dL — AB (ref <130)
Triglycerides: 84 mg/dL (ref <150)
VLDL: 17 mg/dL (ref <30)

## 2015-10-09 LAB — HEPATITIS C ANTIBODY: HCV Ab: NEGATIVE

## 2015-10-09 LAB — TSH: TSH: 1.769 u[IU]/mL (ref 0.350–4.500)

## 2015-10-09 LAB — URIC ACID: Uric Acid, Serum: 3.5 mg/dL (ref 2.4–7.0)

## 2015-10-09 NOTE — Progress Notes (Signed)
Patient ID: [name], female   DOB: 1951/05/01, 65 y.o.   MRN: [id number]     By signing my name below, I, [name], attest that this documentation has been prepared under the direction and in the presence of Arlyss Queen, MD.  Electronically Signed: Zola Rodriguez, Medical Scribe. 10/09/2015. 9:15 AM.   Chief Complaint:  Chief Complaint  Patient presents with  . Annual Exam    HPI: [name] is a 65 y.o. female who reports to [facility] today for a complete physical exam. Patient believes she had a tetanus shot at her OB/GYN office [facility]) within 5 years, but she is not sure. She has regular mammograms done at her OB/GYN. Patient stopped having periods 12 years ago. She does not take any estrogen. Her FMHx includes breast cancer and uterine cancer. She quit smoking in 1995. She does see a dermatologist, Dr. Ubaldo Glassing at [facility]. She denies history of skin cancers. She does not see a cardiologist.  Patient reports having some arthralgias in her left index finger and left great toe. She thinks the pain could be due to her shoes or due to arthritis. She would like a bone density scan. Her FMHx includes osteoporosis in her mother.  Patient takes an Aleve PM at night to help her sleep only when she has work.  Patient states her work has been good. She plans to move her mother, who currently stays at [facility], to a smaller place. This has caused her some stress, but she has her husband and a group of women she meets weekly for support. She and her husband cancelled their trip to [place] due to her mother's hospitalizations.  Past Medical History  Diagnosis Date  . GERD (gastroesophageal reflux disease)    Past Surgical History  Procedure Laterality Date  . Appendectomy    . Disc ectomy    . Tonsilectomy/adenoidectomy with myringotomy    . Pleural scarification    . Pheumothorax      Social History   Social History  . Marital Status: Married    Spouse Name:  N/A  . Number of Children: N/A  . Years of Education: N/A   Social History Main Topics  . Smoking status: Former Smoker    Quit date: 05/23/1996  . Smokeless tobacco: None  . Alcohol Use: No  . Drug Use: No  . Sexual Activity: Yes   Other Topics Concern  . None   Social History Narrative   Research scientist (medical)   exercises   Family History  Problem Relation Age of Onset  . Cancer Father     myo dysplasia  . Hyperlipidemia Brother   . Hypertension Brother   . Mental illness Brother   . Cancer Maternal Grandmother   . Cancer Paternal Grandmother   . Cancer Paternal Grandfather   . Heart disease Mother   . Cancer Mother     breast cancer   No Known Allergies Prior to Admission medications   Medication Sig Start Date End Date Taking? Authorizing Provider  aspirin 81 MG tablet Take 81 mg by mouth daily.   Yes Historical Provider, MD  BIOTIN PO Take by mouth.   Yes Historical Provider, MD  Calcium Carbonate-Vitamin D (CALTRATE 600+D PO) Take by mouth 2 (two) times daily.   Yes Historical Provider, MD  Multiple Vitamins-Minerals (MULTIVITAMIN WITH MINERALS) tablet Take 1 tablet by mouth daily.   Yes Historical Provider, MD  Omega-3 Fatty Acids ([employer]  OIL) 1000 MG CAPS Take by mouth.   Yes Historical Provider, MD  zoster vaccine live, PF, (ZOSTAVAX) 09811 UNT/0.65ML injection Inject 19,400 Units into the skin once. Patient not taking: Reported on 03/19/2015 09/27/13   Darlyne Russian, MD     ROS: Positive for: arthralgias, seasonal allergies, sleep disturbance.  All other systems have been reviewed per patient health survey and were otherwise negative with the exception of those mentioned in the HPI and as above.    PHYSICAL EXAM: Filed Vitals:   10/09/15 0859  BP: 117/74  Pulse: 64  Temp: 97.5 F (36.4 C)  Resp: 16   Body mass index is 20.94 kg/(m^2).   General: Alert, no acute distress HEENT:  Normocephalic, atraumatic, oropharynx patent. Eye:  [facility] Cardiovascular:  Regular rate and rhythm, no rubs murmurs or gallops.  No Carotid bruits, radial pulse intact. No pedal edema.  Respiratory: Clear to auscultation bilaterally.  No wheezes, rales, or rhonchi.  No cyanosis, no use of accessory musculature Abdominal: No organomegaly, abdomen is soft and non-tender, positive bowel sounds.  No masses. Musculoskeletal: Gait intact. No edema, tenderness Skin: No rashes. Neurologic: Facial musculature symmetric. Psychiatric: Patient acts appropriately throughout our interaction. Lymphatic: No cervical or submandibular lymphadenopathy Genitourinary: Breast exam deferred. Gyn exam deferred.  LABS:    EKG/XRAY:   Primary read interpreted by Dr. Everlene Farrier at [facility]. Finger x-ray shows 21 mm calcific densities 1 over the tip of the finger and one at the DIP joint. Toe x-ray shows degenerative changes at the MTP joint no erosions seen. ASSESSMENT/PLAN: 1. Annual physical exam  - POCT urinalysis dipstick - POCT Microscopic Urinalysis ([facility]) - CBC with Differential/Platelet - TSH  2. Pain in joint, ankle and foot, left Toe film shows arthritic changes only.  3. Pain of finger of left hand  There are 21 mm foreign bodies present over the index finger - Uric acid - DG Toe Great Left; Future  5. Screening cholesterol level Patient will decide if she wants to take a statin or not. Decision to be made after cholesterol returns. - Lipid panel  6. Screening for HIV (human immunodeficiency virus)  - HIV antibody  7. Need for hepatitis C screening test  - Hepatitis C antibody  8. Need for Tdap vaccination  - Tdap vaccine greater than or equal to 7yo IM  9. Menopause She has regular GYN checkups. She is up-to-date on her mammograms. - DG Bone Density; Future   I personally performed the services described in this documentation, which was scribed in my presence. The recorded information has been reviewed and is  accurate.  Arlyss Queen, MD  [facility]  10/09/2015 10:12 AM    Gross sideeffects, risk and benefits, and alternatives of medications d/w patient. Patient is aware that all medications have potential sideeffects and we are unable to predict every sideeffect or drug-drug interaction that may occur.  Arlyss Queen MD 10/09/2015 9:15 AM

## 2015-10-10 MED ORDER — ATORVASTATIN CALCIUM 20 MG PO TABS: 20.0000 mg | ORAL_TABLET | ORAL | Status: DC

## 2015-10-10 NOTE — Addendum Note (Signed)
Addended by: Constance Goltz on: 10/10/2015 04:47 PM   Modules accepted: Orders, SmartSet

## 2015-10-17 ENCOUNTER — Other Ambulatory Visit: Payer: Self-pay

## 2015-10-17 DIAGNOSIS — E2839 Other primary ovarian failure: Secondary | ICD-10-CM

## 2015-11-13 ENCOUNTER — Ambulatory Visit
Admission: RE | Admit: 2015-11-13 | Discharge: 2015-11-13 | Disposition: A | Discharge status: Home / Self Care | Payer: PRIVATE HEALTH INSURANCE | Source: Ambulatory Visit | Attending: Emergency Medicine | Admitting: Emergency Medicine

## 2015-11-13 DIAGNOSIS — E2839 Other primary ovarian failure: Secondary | ICD-10-CM

## 2015-11-14 ENCOUNTER — Other Ambulatory Visit: Payer: Self-pay | Admitting: Emergency Medicine

## 2015-11-14 DIAGNOSIS — M8580 Other specified disorders of bone density and structure, unspecified site: Secondary | ICD-10-CM

## 2015-12-13 ENCOUNTER — Ambulatory Visit (INDEPENDENT_AMBULATORY_CARE_PROVIDER_SITE_OTHER): Payer: PRIVATE HEALTH INSURANCE | Admitting: Internal Medicine

## 2015-12-13 ENCOUNTER — Encounter: Payer: Self-pay | Admitting: Internal Medicine

## 2015-12-13 VITALS — BP 122/60 | HR 72 | Temp 98.2°F | Resp 12 | Ht 63.5 in | Wt 125.0 lb

## 2015-12-13 DIAGNOSIS — M85851 Other specified disorders of bone density and structure, right thigh: Secondary | ICD-10-CM | POA: Insufficient documentation

## 2015-12-13 DIAGNOSIS — M810 Age-related osteoporosis without current pathological fracture: Secondary | ICD-10-CM | POA: Insufficient documentation

## 2015-12-13 DIAGNOSIS — M8580 Other specified disorders of bone density and structure, unspecified site: Secondary | ICD-10-CM | POA: Diagnosis not present

## 2015-12-13 NOTE — Patient Instructions (Signed)
Please stop at the lab.  Please decrease the Caltrate-D dose to 1x a day.  Please give me a call ~ 10/2016 to order the new DEXA scan.  Please return in 1 year.  How Can I Prevent Falls? Men and women with osteoporosis need to take care not to fall down. Falls can break bones. Some reasons people fall are: Poor vision  Poor balance  Certain diseases that affect how you walk  Some types of medicine, such as sleeping pills.  Some tips to help prevent falls outdoors are: Use a cane or walker  Wear rubber-soled shoes so you don't slip  Walk on grass when sidewalks are slippery  In winter, put salt or kitty litter on icy sidewalks.  Some ways to help prevent falls indoors are: Keep rooms free of clutter, especially on floors  Use plastic or carpet runners on slippery floors  Wear low-heeled shoes that provide good support  Do not walk in socks, stockings, or slippers  Be sure carpets and area rugs have skid-proof backs or are tacked to the floor  Be sure stairs are well lit and have rails on both sides  Put grab bars on bathroom walls near tub, shower, and toilet  Use a rubber bath mat in the shower or tub  Keep a flashlight next to your bed  Use a sturdy step stool with a handrail and wide steps  Add more lights in rooms (and night lights) Buy a cordless phone to keep with you so that you don't have to rush to the phone       when it rings and so that you can call for help if you fall.   (adapted from http://www.niams.[employer].at)  Dietary sources of calcium and vitamin D:  Calcium content (mg) - http://www.niams.https://www.gonzalez.org/  Fortified oatmeal, 1 packet 350  Sardines, canned in oil, with edible bones, 3 oz. 324  Cheddar cheese, 1 oz. shredded 306  Milk, nonfat, 1 cup 302  Milkshake, 1 cup 300  Yogurt, plain, low-fat, 1 cup 300  Soybeans, cooked, 1 cup 261  Tofu, firm, with calcium,  cup 204  Orange  juice, fortified with calcium, 6 oz. 200-260 (varies)  Salmon, canned, with edible bones, 3 oz. 181  Pudding, instant, made with 2% milk,  cup 153  Baked beans, 1 cup 142  Cottage cheese, 1% milk fat, 1 cup 138  Spaghetti, lasagna, 1 cup 125  Frozen yogurt, vanilla, soft-serve,  cup 103  Ready-to-eat cereal, fortified with calcium, 1 cup 100-1,000 (varies)  Cheese pizza, 1 slice 100  Fortified waffles, 2 100  Turnip greens, boiled,  cup 99  Broccoli, raw, 1 cup 90  Ice cream, vanilla,  cup 85  Soy or rice milk, fortified with calcium, 1 cup 80-500 (varies)   Vitamin D content (International Units, IU) - https://www.ars.usda.gov Cod liver oil, 1 tablespoon 1,360  Swordfish, cooked, 3 oz 566  Salmon (sockeye), cooked, 3 oz 447  Tuna fish, canned in water, drained, 3 oz 154  Orange juice fortified with vitamin D, 1 cup (check product labels, as amount of added vitamin D varies) 137  Milk, nonfat, reduced fat, and whole, vitamin D-fortified, 1 cup 115-124  Yogurt, fortified with 20% of the daily value for vitamin D, 6 oz 80  Margarine, fortified, 1 tablespoon 60  Sardines, canned in oil, drained, 2 sardines 46  Liver, beef, cooked, 3 oz 42  Egg, 1 large (vitamin D is found in yolk) 41  Ready-to-eat cereal, fortified with 10%  of the daily value for vitamin D, 0.75-1 cup  40  Cheese, Swiss, 1 oz 6   Exercise for Strong Bones (from [place]) There are two types of exercises that are important for building and maintaining bone density:  weight-bearing and muscle-strengthening exercises. Weight-bearing Exercises These exercises include activities that make you move against gravity while staying upright. Weight-bearing exercises can be high-impact or low-impact. High-impact weight-bearing exercises help build bones and keep them strong. If you have broken a bone due to osteoporosis or are at risk of breaking a bone, you may need to avoid high-impact exercises. If  you're not sure, you should check with your healthcare provider. Examples of high-impact weight-bearing exercises are: . Dancing . Doing high-impact aerobics . Hiking . Jogging/running . Jumping Rope . Stair climbing . Tennis Low-impact weight-bearing exercises can also help keep bones strong and are a safe alternative if you cannot do high-impact exercises. Examples of low-impact weight-bearing exercises are: . Using elliptical training machines . Doing low-impact aerobics . Using stair-step machines . Fast walking on a treadmill or outside Muscle-Strengthening Exercises These exercises include activities where you move your body, a weight or some other resistance against gravity. They are also known as resistance exercises and include: . Lifting weights . Using elastic exercise bands . Using weight machines . Lifting your own body weight . Functional movements, such as standing and rising up on your toes Yoga and Pilates can also improve strength, balance and flexibility. However, certain positions may not be safe for people with osteoporosis or those at increased risk of broken bones. For example, exercises that have you bend forward may increase the chance of breaking a bone in the spine. A physical therapist should be able to help you learn which exercises are safe and appropriate for you. Non-Impact Exercises Non-impact exercises can help you to improve balance, posture and how well you move in everyday activities. These exercises can also help to increase muscle strength and decrease the risk of falls and broken bones. Some of these exercises include: . Balance exercises that strengthen your legs and test your balance, such as Tai Chi, can decrease your risk of falls. . Posture exercises that improve your posture and reduce rounded or "sloping" shoulders can help you decrease the chance of breaking a bone, especially in the spine. . Functional exercises that improve how well you move  can help you with everyday activities and decrease your chance of falling and breaking a bone. For example, if you have trouble getting up from a chair or climbing stairs, you should do these activities as exercises. A physical therapist can teach you balance, posture and functional exercises. Starting a New Exercise Program If you haven't exercised regularly for a while, check with your healthcare provider before beginning a new exercise program-particularly if you have health problems such as heart disease, diabetes or high blood pressure. If you're at high risk of breaking a bone, you should work with a physical therapist to develop a safe exercise program. Once you have your healthcare provider's approval, start slowly. If you've already broken bones in the spine because of osteoporosis, be very careful to avoid activities that require reaching down, bending forward, rapid twisting motions, heavy lifting and those that increase your chance of a fall. As you get started, your muscles may feel sore for a day or two after you exercise. If soreness lasts longer, you may be working too hard and need to ease up. Exercises should be done  in a pain-free range of motion. How Much Exercise Do You Need? Weight-bearing exercises 30 minutes on most days of the week. Do a 30-minutesession or multiple sessions spread out throughout the day. The benefits to your bones are the same.   Muscle-strengthening exercises Two to three days per week. If you don't have much time for strengthening/resistance training, do small amounts at a time. You can do just one body part each day. For example do arms one day, legs the next and trunk the next. You can also spread these exercises out during your normal day.  Balance, posture and functional exercises Every day or as often as needed. You may want to focus on one area more than the others. If you have fallen or lose your balance, spend time doing balance exercises. If you are  getting rounded shoulders, work more on posture exercises. If you have trouble climbing stairs or getting up from the couch, do more functional exercises. You can also perform these exercises at one time or spread them during your day. Work with a phyiscal therapist to learn the right exercises for you.

## 2015-12-13 NOTE — Progress Notes (Signed)
Patient ID: [name], female   DOB: 23-Apr-1951, 65 y.o.   MRN: [id number]   HPI  [name] is a 65 y.o.-year-old female, referred by her PCP, Dr. Everlene Farrier, for management of osteopenia (Op).  Pt was dx with Op in 2007.  I reviewed pt's DEXA scans: Date L1-L4 T score FN T score FRAX  11/13/2015 -1.1 RFN: -2.4 LFN: -2.4 MOF: 10.8% Hip fx risk: 2.1   She denies fractures or falls.  No dizziness/orthostasis/poor vision. She has vertigo related to allergies.  She has been on the following OP treatments: - started Fosamax in 2007 >> Fosamax x 6 mo >> stomach burning >> stopped   Started to have pain in R hip 2 years ago - after exerting herself. Pain better after she increased the amount of exercise.  She exercises 3x a week: walking, upper body exercises, stationary bike.  No h/o vitamin D deficiency. On MVI Centrum Silver & Caltrate + D 600-800 2x a day. She also eats dairy and green, leafy, vegetables.   + weight bearing exercises:   She does not take high vitamin A doses. She does take Biotin.  Menopause was at 65 y/o. She had severe hot flushes, now better.   Pt does have a FH of osteoporosis in her mother - has a h/o BrCa.  No h/o hyper/hypocalcemia or hyperparathyroidism. + h/o kidney stones. Lab Results  Component Value Date   CALCIUM 9.7 09/27/2013   CALCIUM 9.3 08/25/2007   No h/o thyrotoxicosis. Reviewed TSH recent levels:  Lab Results  Component Value Date   TSH 1.769 10/09/2015   TSH 2.066 09/27/2013   No h/o CKD. Last BUN/Cr: Lab Results  Component Value Date   BUN 14 09/27/2013   CREATININE 0.61 09/27/2013   She also has a history of hyperlipidemia and hypertension.   ROS: Constitutional: no weight gain/loss, + fatigue, no subjective hyperthermia/hypothermia, + poor sleep Eyes: no blurry vision, no xerophthalmia ENT: no sore throat, no nodules palpated in throat, + dysphagia/no odynophagia, no hoarseness, + decreased hearing Cardiovascular: no  CP/SOB/palpitations/leg swelling Respiratory: no cough/SOB Gastrointestinal: no N/V/D/C/+ Acid reflux Musculoskeletal: no muscle/+ joint aches Skin: no rashes Neurological: no tremors/numbness/tingling/dizziness Psychiatric: no depression/anxiety + Low libido  Past Medical History  Diagnosis Date  . GERD (gastroesophageal reflux disease)    Past Surgical History  Procedure Laterality Date  . Appendectomy    . Disc ectomy    . Tonsilectomy/adenoidectomy with myringotomy    . Pleural scarification    . Pheumothorax      Social History   Social History  . Marital Status: Married    Spouse Name: N/A  . Number of Children: 0   Occupational History  . Administrative assistant    Social History Main Topics  . Smoking status: Former Smoker    Quit date: 05/23/1996  . Smokeless tobacco: Not on file  . Alcohol Use: No  . Drug Use: No   Social History Narrative   College Degree   Current Outpatient Prescriptions on File Prior to Visit  Medication Sig Dispense Refill  . aspirin 81 MG tablet Take 81 mg by mouth daily.    Marland Kitchen BIOTIN PO Take by mouth.    . Calcium Carbonate-Vitamin D (CALTRATE 600+D PO) Take by mouth 2 (two) times daily.    . Multiple Vitamins-Minerals (MULTIVITAMIN WITH MINERALS) tablet Take 1 tablet by mouth daily.    . Omega-3 Fatty Acids (FISH OIL) 1000 MG CAPS Take by mouth.    Marland Kitchen  zoster vaccine live, PF, (ZOSTAVAX) 47627 UNT/0.65ML injection Inject 19,400 Units into the skin once. 1 each 0   No current facility-administered medications on file prior to visit.   No Known Allergies Family History  Problem Relation Age of Onset  . Cancer Father     myo dysplasia  . Hyperlipidemia Brother   . Hypertension Brother   . Mental illness Brother   . Cancer Maternal Grandmother   . Cancer Paternal Grandmother   . Cancer Paternal Grandfather   . Heart disease Mother   . Cancer Mother     breast cancer   PE: BP 122/60 mmHg  Pulse 72  Temp(Src) 98.2 F  (36.8 C) (Oral)  Resp 12  Ht 5' 3.5" (1.613 m)  Wt 125 lb (56.7 kg)  BMI 21.79 kg/m2  SpO2 96% Wt Readings from Last 3 Encounters:  12/13/15 125 lb (56.7 kg)  10/09/15 121 lb (54.885 kg)  03/19/15 122 lb (55.339 kg)   Constitutional: normal weight, in NAD.  Eyes: PERRLA, EOMI, no exophthalmos ENT: moist mucous membranes, no thyromegaly, no cervical lymphadenopathy Cardiovascular: RRR, No MRG Respiratory: CTA B Gastrointestinal: abdomen soft, NT, ND, BS+ Musculoskeletal: no deformities, strength intact in all 4. No kyphosis. No tenderness to percussion of the spine Skin: moist, warm, no rashes Neurological: no tremor with outstretched hands, DTR normal in all 4  Assessment: 1. Osteopenia  Plan: 1. Osteopenia - likely postmenopausal, she has FH of OP - Discussed about increased risk of fracture, depending on the T score, greatly increased when the T score is lower than -2.5, but it is actually a continuum and -2.5 should not be regarded as an absolute threshold. We reviewed her latest DEXA scan report together, and I explained that based on the T scores, she has an increased risk for fractures compare with somebody who has normal T-scores, but lower risk of fracture compared with somebody with osteoporosis. We also reviewed her FRAX scores together, which does not indicate a particularly increased risk for fracture. - we reviewed her dietary and supplemental calcium and vitamin D intake. I recommended to make sure she gets 1000-1200 mg of calcium daily and I will check vit D today to see if she needs increased supplementation - given her specific instructions about food sources for Calcium and Vitamin D - see pt instructions. She has a history of kidney stones, and she is taking more than 2000 mg of calcium from supplements >> advised her to reduce the dose of Caltrate to just 1 tablet a day. Continue the multivitamins. - discussed fall precautions   - given handout from [facility]  [facility]steopor[employer] Re: weight bearing exercises - advised to do this every day or at least 5/7 days - we discussed about maintaining a good amount of protein in her diet. The recommended daily protein intake is ~0.8 g per kilogram per day. I advised her to try to aim for this amount, since a diet low in proteins can exacerbate osteoporosis. Also, avoid smoking or >2 drinks of alcohol a day. - I suggested that we optimize her calcium and vitamin D intake, and also her dietary proteins and her daily exercise, but otherwise hold off starting treatment for now. She is active, she exercises consistently, and does not have history of fractures. In this case, I would like to repeat her DEXA scan in 1-2 years, depending on insurance preference, and decide for or against treatment at that time, depending on the T scores. I explained that lack of  fractures are the most desired outcome, while stability or even increasing T-scores on the DEXA scans are secondary. - We discussed about the different medication classes, benefits and side effects (including atypical fractures and ONJ). We may need to start either Reclast are probably a if the BMD decreases at next check. - Today we'll check the following tests to investigate for kidney disease, vitamin D deficiency, hyperparathyroidism, as possible causes for her osteopenia: Orders Placed This Encounter  Procedures  . COMPLETE METABOLIC PANEL WITH GFR  . VITAMIN D 25 Hydroxy (Vit-D Deficiency, Fractures)  . Parathyroid hormone, intact (no Ca)  - will see pt back in a year >> she will call me before this visit so that we can order her next DEXA scan to discuss at next visit  - time spent with the patient: 1 hour, of which >50% was spent in obtaining information about her symptoms, reviewing her previous labs, evaluations, and treatments, counseling her about her condition (please see the discussed topics above), and developing a plan to further investigate it; she  had a number of questions which I addressed.  Office Visit on 12/13/2015  Component Date Value Ref Range Status  . Sodium 12/13/2015 139  135 - 146 mmol/L Final  . Potassium 12/13/2015 4.7  3.5 - 5.3 mmol/L Final  . Chloride 12/13/2015 102  98 - 110 mmol/L Final  . CO2 12/13/2015 28  20 - 31 mmol/L Final  . Glucose, Bld 12/13/2015 81  65 - 99 mg/dL Final  . BUN 12/13/2015 12  7 - 25 mg/dL Final  . Creat 12/13/2015 0.67  0.50 - 0.99 mg/dL Final  . Total Bilirubin 12/13/2015 0.4  0.2 - 1.2 mg/dL Final  . Alkaline Phosphatase 12/13/2015 56  33 - 130 U/L Final  . AST 12/13/2015 19  10 - 35 U/L Final  . ALT 12/13/2015 16  6 - 29 U/L Final  . Total Protein 12/13/2015 6.8  6.1 - 8.1 g/dL Final  . Albumin 12/13/2015 4.1  3.6 - 5.1 g/dL Final  . Calcium 12/13/2015 9.3  8.6 - 10.4 mg/dL Final  . GFR, Est African American 12/13/2015 >89  >=60 mL/min Final  . GFR, Est Non African American 12/13/2015 >89  >=60 mL/min Final   Comment:   The estimated GFR is a calculation valid for adults (>=25 years old) that uses the CKD-EPI algorithm to adjust for age and sex. It is   not to be used for children, pregnant women, hospitalized patients,    patients on dialysis, or with rapidly changing kidney function. According to the NKDEP, eGFR >89 is normal, 60-89 shows mild impairment, 30-59 shows moderate impairment, 15-29 shows severe impairment and <15 is ESRD.     Marland Kitchen VITD 12/13/2015 65.11  30.00 - 100.00 ng/mL Final  . PTH 12/13/2015 22  15 - 65 pg/mL Final   Excellent labs.  Msg sent: Dear Ms. Ruble, Labs are excellent! You can decrease the dose of Caltrate just 1 tablet a day, but you do not need to add more vitamin D to your regimen, since your vitamin D level is great! Sincerely, Philemon Kingdom MD

## 2015-12-14 LAB — COMPLETE METABOLIC PANEL WITH GFR
ALT: 16 U/L (ref 6–29)
AST: 19 U/L (ref 10–35)
Albumin: 4.1 g/dL (ref 3.6–5.1)
Alkaline Phosphatase: 56 U/L (ref 33–130)
BILIRUBIN TOTAL: 0.4 mg/dL (ref 0.2–1.2)
BUN: 12 mg/dL (ref 7–25)
CALCIUM: 9.3 mg/dL (ref 8.6–10.4)
CHLORIDE: 102 mmol/L (ref 98–110)
CO2: 28 mmol/L (ref 20–31)
CREATININE: 0.67 mg/dL (ref 0.50–0.99)
Glucose, Bld: 81 mg/dL (ref 65–99)
Potassium: 4.7 mmol/L (ref 3.5–5.3)
Sodium: 139 mmol/L (ref 135–146)
TOTAL PROTEIN: 6.8 g/dL (ref 6.1–8.1)

## 2015-12-14 LAB — VITAMIN D 25 HYDROXY (VIT D DEFICIENCY, FRACTURES): VITD: 65.11 ng/mL (ref 30.00–100.00)

## 2015-12-14 LAB — PARATHYROID HORMONE, INTACT (NO CA): PTH: 22 pg/mL (ref 15–65)

## 2016-01-29 ENCOUNTER — Other Ambulatory Visit: Payer: Self-pay | Admitting: Obstetrics and Gynecology

## 2016-01-31 ENCOUNTER — Other Ambulatory Visit: Payer: Self-pay | Admitting: Obstetrics and Gynecology

## 2016-01-31 DIAGNOSIS — R928 Other abnormal and inconclusive findings on diagnostic imaging of breast: Secondary | ICD-10-CM

## 2016-01-31 LAB — CYTOLOGY - PAP

## 2016-02-01 ENCOUNTER — Ambulatory Visit
Admission: RE | Admit: 2016-02-01 | Discharge: 2016-02-01 | Disposition: A | Discharge status: Home / Self Care | Payer: PRIVATE HEALTH INSURANCE | Source: Ambulatory Visit | Attending: Obstetrics and Gynecology | Admitting: Obstetrics and Gynecology

## 2016-02-01 DIAGNOSIS — R928 Other abnormal and inconclusive findings on diagnostic imaging of breast: Secondary | ICD-10-CM

## 2016-02-05 DIAGNOSIS — R928 Other abnormal and inconclusive findings on diagnostic imaging of breast: Secondary | ICD-10-CM | POA: Insufficient documentation

## 2016-09-09 ENCOUNTER — Ambulatory Visit (HOSPITAL_COMMUNITY)
Admission: RE | Admit: 2016-09-09 | Discharge: 2016-09-09 | Disposition: A | Discharge status: Home / Self Care | Payer: PRIVATE HEALTH INSURANCE | Source: Ambulatory Visit | Attending: Physician Assistant | Admitting: Physician Assistant

## 2016-09-09 ENCOUNTER — Ambulatory Visit (INDEPENDENT_AMBULATORY_CARE_PROVIDER_SITE_OTHER): Payer: PRIVATE HEALTH INSURANCE | Admitting: Physician Assistant

## 2016-09-09 VITALS — BP 102/68 | HR 77 | Temp 98.8°F | Resp 18 | Ht 63.5 in | Wt 125.0 lb

## 2016-09-09 DIAGNOSIS — R1030 Lower abdominal pain, unspecified: Secondary | ICD-10-CM | POA: Insufficient documentation

## 2016-09-09 DIAGNOSIS — N858 Other specified noninflammatory disorders of uterus: Secondary | ICD-10-CM | POA: Diagnosis not present

## 2016-09-09 DIAGNOSIS — N939 Abnormal uterine and vaginal bleeding, unspecified: Secondary | ICD-10-CM

## 2016-09-09 DIAGNOSIS — R1900 Intra-abdominal and pelvic swelling, mass and lump, unspecified site: Secondary | ICD-10-CM | POA: Diagnosis not present

## 2016-09-09 LAB — POCT URINALYSIS DIP (MANUAL ENTRY)
Bilirubin, UA: NEGATIVE
Blood, UA: NEGATIVE
Glucose, UA: NEGATIVE
Ketones, POC UA: NEGATIVE
LEUKOCYTES UA: NEGATIVE
NITRITE UA: NEGATIVE
PH UA: 7.5
PROTEIN UA: NEGATIVE
Spec Grav, UA: 1.015
Urobilinogen, UA: 0.2

## 2016-09-09 LAB — POCT WET + KOH PREP
TRICH BY WET PREP: ABSENT
Yeast by KOH: ABSENT
Yeast by wet prep: ABSENT

## 2016-09-09 LAB — POCT CBC
Granulocyte percent: 59.7 %G (ref 37–80)
HCT, POC: 40.5 % (ref 37.7–47.9)
HEMOGLOBIN: 13.8 g/dL (ref 12.2–16.2)
LYMPH, POC: 2.8 (ref 0.6–3.4)
MCH, POC: 28.5 pg (ref 27–31.2)
MCHC: 34.2 g/dL (ref 31.8–35.4)
MCV: 83.3 fL (ref 80–97)
MID (cbc): 0.8 (ref 0–0.9)
MPV: 6.6 fL (ref 0–99.8)
POC Granulocyte: 5.4 (ref 2–6.9)
POC LYMPH PERCENT: 31.1 %L (ref 10–50)
POC MID %: 9.2 % (ref 0–12)
Platelet Count, POC: 359 10*3/uL (ref 142–424)
RBC: 4.87 M/uL (ref 4.04–5.48)
RDW, POC: 13.8 %
WBC: 9 10*3/uL (ref 4.6–10.2)

## 2016-09-09 LAB — POC MICROSCOPIC URINALYSIS (UMFC): Mucus: ABSENT

## 2016-09-09 NOTE — Progress Notes (Signed)
[name]  MRN: [id number] DOB: Nov 22, 1950  Subjective:  [name] is a 65 y.o. female seen in office today for a chief complaint of right sided abdominal cramping x 1 week. It has progressed to the right low back. Has no other associated symptoms. Her last bowel movement was today and notes it was normal consisentency. However, she does note she has had to strain more for the past week. She denies any change in her diet except for trying to drink more water. She has not taken anything for her abdominal cramping.   Pt is postmenopausal. Her last menstural cycle was when she was 65yo.  She is not sexaully active. She has never had children, does note that she was infertile and tried infertility drugs in the past but these did not work for her.   PSH of appendectomy in 2006. Last colonoscopy was normal in 2014. Las pap smear was 01/2016 and was normal. Dr. Rogue Bussing is her gynecologist.   FH of uterine cancer and cervical cancer in MGM and PGM. No family history of autoimmune disorder.    Review of Systems  Constitutional: Negative for activity change, appetite change, chills, diaphoresis, fatigue, fever and unexpected weight change.  Gastrointestinal: Positive for abdominal distention ( feels bloated). Negative for blood in stool, nausea, rectal pain and vomiting.  Genitourinary: Positive for frequency. Negative for difficulty urinating, dysuria, hematuria, urgency, vaginal bleeding and vaginal discharge.  Musculoskeletal: Negative for arthralgias and myalgias.  Skin: Negative for rash.    Patient Active Problem List   Diagnosis Date Noted  . Osteopenia 12/13/2015  . Nonspecific abnormal electrocardiogram (ECG) (EKG) 09/27/2013    Current Outpatient Prescriptions on File Prior to Visit  Medication Sig Dispense Refill  . aspirin 81 MG tablet Take 81 mg by mouth daily.    Marland Kitchen BIOTIN PO Take by mouth.    . Calcium Carbonate-Vitamin D (CALTRATE 600+D PO) Take by mouth daily.     .  Multiple Vitamins-Minerals (MULTIVITAMIN WITH MINERALS) tablet Take 1 tablet by mouth daily.    . Omega-3 Fatty Acids (FISH OIL) 1000 MG CAPS Take by mouth.    . zoster vaccine live, PF, (ZOSTAVAX) 35009 UNT/0.65ML injection Inject 19,400 Units into the skin once. 1 each 0   No current facility-administered medications on file prior to visit.     No Known Allergies    Social History   Social History  . Marital status: Married    Spouse name: N/A  . Number of children: N/A  . Years of education: N/A   Occupational History  . Not on file.   Social History Main Topics  . Smoking status: Former Smoker    Quit date: 05/23/1996  . Smokeless tobacco: Never Used  . Alcohol use No  . Drug use: No  . Sexual activity: Yes   Other Topics Concern  . Not on file   Social History Narrative   Research scientist (medical)   exercises    Objective:  BP 102/68 (BP Location: Right Arm, Patient Position: Sitting, Cuff Size: Small)   Pulse 77   Temp 98.8 F (37.1 C) (Oral)   Resp 18   Ht 5' 3.5" (1.613 m)   Wt 125 lb (56.7 kg)   SpO2 97%   BMI 21.80 kg/m   Physical Exam  Constitutional: She is oriented to person, place, and time and well-developed, well-nourished, and in no distress.  HENT:  Head: Normocephalic and atraumatic.  Eyes: Conjunctivae are  normal.  Neck: Normal range of motion.  Cardiovascular: Normal rate, regular rhythm and normal heart sounds.   Pulmonary/Chest: Effort normal and breath sounds normal.  Abdominal: She exhibits distension (mild distension in lower abdomen). She exhibits no ascites. Bowel sounds are hyperactive. There is tenderness. There is no rigidity and no guarding.    Genitourinary: Cervix normal and vulva normal. Enlarged:  palpated up to umbilicus. Right adnexum displays tenderness. Left adnexum displays no tenderness. Vaginal discharge ( scant bloody discharge noted) found.  Genitourinary Comments: Palpable mass felt during  bimanual exam up to umbilicus. Undetermined location.   Tenderness with pelvic exam.   Neurological: She is alert and oriented to person, place, and time. Gait normal.  Skin: Skin is warm and dry.  Psychiatric: Affect normal.  Vitals reviewed.  Results for orders placed or performed in visit on 09/09/16 (from the past 24 hour(s))  POCT CBC     Status: None   Collection Time: 09/09/16  1:31 PM  Result Value Ref Range   WBC 9.0 4.6 - 10.2 K/uL   Lymph, poc 2.8 0.6 - 3.4   POC LYMPH PERCENT 31.1 10 - 50 %L   MID (cbc) 0.8 0 - 0.9   POC MID % 9.2 0 - 12 %M   POC Granulocyte 5.4 2 - 6.9   Granulocyte percent 59.7 37 - 80 %G   RBC 4.87 4.04 - 5.48 M/uL   Hemoglobin 13.8 12.2 - 16.2 g/dL   HCT, POC 63.8 93.7 - 47.9 %   MCV 83.3 80 - 97 fL   MCH, POC 28.5 27 - 31.2 pg   MCHC 34.2 31.8 - 35.4 g/dL   RDW, POC 34.2 %   Platelet Count, POC 359 142 - 424 K/uL   MPV 6.6 0 - 99.8 fL  POCT urinalysis dipstick     Status: None   Collection Time: 09/09/16  1:32 PM  Result Value Ref Range   Color, UA yellow yellow   Clarity, UA clear clear   Glucose, UA negative negative   Bilirubin, UA negative negative   Ketones, POC UA negative negative   Spec Grav, UA 1.015    Blood, UA negative negative   pH, UA 7.5    Protein Ur, POC negative negative   Urobilinogen, UA 0.2    Nitrite, UA Negative Negative   Leukocytes, UA Negative Negative  POCT Microscopic Urinalysis ([facility])     Status: Abnormal   Collection Time: 09/09/16  1:33 PM  Result Value Ref Range   WBC,UR,HPF,POC None None WBC/hpf   RBC,UR,HPF,POC None None RBC/hpf   Bacteria None None, Too numerous to count   Mucus Absent Absent   Epithelial Cells, UR Per Microscopy Few (A) None, Too numerous to count cells/hpf  POCT Wet + KOH Prep     Status: Abnormal   Collection Time: 09/09/16  1:36 PM  Result Value Ref Range   Yeast by KOH Absent Absent   Yeast by wet prep Absent Absent   WBC by wet prep None (A) Few   Clue Cells Wet Prep  HPF POC None None   Trich by wet prep Absent Absent   Bacteria Wet Prep HPF POC None (A) Few   Epithelial Cells By [employer] Pref ([facility]) Few None, Few, Too numerous to count   RBC,UR,HPF,POC None None RBC/hpf   US Transvaginal Non-ob  Result Date: 09/09/2016 CLINICAL DATA:  Pelvic pain. EXAM: TRANSABDOMINAL AND TRANSVAGINAL ULTRASOUND OF PELVIS TECHNIQUE: Both transabdominal and transvaginal ultrasound examinations of  the pelvis were performed. Transabdominal technique was performed for global imaging of the pelvis including uterus, ovaries, adnexal regions, and pelvic cul-de-sac. It was necessary to proceed with endovaginal exam following the transabdominal exam to visualize the uterus and ovaries. COMPARISON:  CT 05/16/2009. FINDINGS: Uterus Measurements: 4.6 x 1.6 x 3.27. No fibroids or other mass visualized. Endometrium Thickness: 2.6 mm.  No focal abnormality visualized. Right ovary Measurements: 1.5 x 0.6 x 0.8 cm. Normal appearance/no adnexal mass. Left ovary Measurements: 0.7 x 0.4 x 0.7 cm. Normal appearance/no adnexal mass. Other findings A 14.7 x 7.5 x 10.9 cm complex cystic mass is noted above the uterus. This could represent a complex cystic tumor of a gynecologic origin. Complex cystic tumor or other lesion of non gynecologic origin could also present this fashion. Trace free pelvic fluid noted . Gynecologic consultation suggested for further evaluation. IMPRESSION: 14.7 x 7.5 x 10.9 cm complex cystic mass is noted above the uterus. This could represent a complex cystic tumor of gynecologic origin. Other etiologies complex cystic masses cannot be excluded. Trace free pelvic fluid . Gynecologic evaluation is suggested. Electronically Signed   By: Marcello Moores  Register   On: 09/09/2016 15:46   US Pelvis Complete  Result Date: 09/09/2016 CLINICAL DATA:  Abdominal pain. EXAM: TRANSABDOMINAL AND TRANSVAGINAL ULTRASOUND OF PELVIS TECHNIQUE: Both transabdominal and transvaginal ultrasound examinations of  the pelvis were performed. Transabdominal technique was performed for global imaging of the pelvis including uterus, ovaries, adnexal regions, and pelvic cul-de-sac. It was necessary to proceed with endovaginal exam following the transabdominal exam to visualize the uterus and ovaries. COMPARISON:  CT 03/16/2009 . FINDINGS: Uterus Measurements: 4.6 x 1.6 x 3.2 cm. No fibroids or other mass visualized. Endometrium Thickness: 2.6 mm.  No focal abnormality visualized. Right ovary Measurements: 1.5 x 0.6 x 0.8 cm. Normal appearance/no adnexal mass. Left ovary Measurements: 0.7 x 0.4 x 0.7 cm. Normal appearance/no adnexal mass. Other findings Trace free pelvic fluid. A 14.7 x 7.5 x 10.9 cm complex cystic mass is noted superior to the uterus. This could be a tumor of gynecologic origin. This could also be a cystic tumor of other origin. Other etiologies cystic lesions cannot be excluded. Gynecologic evaluation suggested . IMPRESSION: A complex septated cystic mass measuring 14.7 x 7.5 x 10.9 cm is noted superior to the uterus. This could be a cystic tumor of gynecologic origin. Other etiologies of cystic tumor and/or cystic lesions cannot be excluded. Associated trace ascites. Gynecologic evaluation suggested. Electronically Signed   By: Marcello Moores  Register   On: 09/09/2016 15:42    Assessment and Plan :  This case was precepted with Dr. Carlota Raspberry.   1. Lower abdominal pain Labs pending  - POCT CBC - POCT urinalysis dipstick - POCT Microscopic Urinalysis ([facility]) - POCT Wet + KOH Prep - CMP14+EGFR - US Pelvis Complete; Future - US Transvaginal Non-OB; Future  2. Vaginal bleeding - Ambulatory referral to Gynecology  3. Pelvic mass -Pt instructed to go to [facility] for STAT pelvic US. Notified with results and urgent referral placed for gynecology. Pt has appointment scheduled for tomorrow, 09/10/16, at [place] with [facility] for further evaluation of US findings.  - US Pelvis Complete; Future - US  Transvaginal Non-OB; Future - Ambulatory referral to Gynecology -Instructed to seek care at ER tonight if she develops any worsening of pain or new alarming symptoms.   Kristi Delaine PA-C  [facility] 09/09/2016 4:53 PM

## 2016-09-09 NOTE — Patient Instructions (Addendum)
GO TO [place] TODAY AT 3PM FULL BLADDER [address]   I will contact you with the results.   IF you received an x-ray today, you will receive an invoice from [facility]. Please contact [facility] at [phone] with questions or concerns regarding your invoice.   IF you received labwork today, you will receive an invoice from [facility]. Please contact [facility] at [phone] with questions or concerns regarding your invoice.   Our billing staff will not be able to assist you with questions regarding bills from these companies.  You will be contacted with the lab results as soon as they are available. The fastest way to get your results is to activate your My Chart account. Instructions are located on the last page of this paperwork. If you have not heard from [place] regarding the results in 2 weeks, please contact this office.

## 2016-09-10 ENCOUNTER — Telehealth: Payer: Self-pay | Admitting: Emergency Medicine

## 2016-09-10 LAB — CMP14+EGFR
ALBUMIN: 4.7 g/dL (ref 3.6–4.8)
ALK PHOS: 77 IU/L (ref 39–117)
ALT: 20 IU/L (ref 0–32)
AST: 22 IU/L (ref 0–40)
Albumin/Globulin Ratio: 1.6 (ref 1.2–2.2)
BUN / CREAT RATIO: 16 (ref 12–28)
BUN: 13 mg/dL (ref 8–27)
Bilirubin Total: 0.2 mg/dL (ref 0.0–1.2)
CALCIUM: 9.7 mg/dL (ref 8.7–10.3)
CO2: 27 mmol/L (ref 18–29)
CREATININE: 0.79 mg/dL (ref 0.57–1.00)
Chloride: 99 mmol/L (ref 96–106)
GFR calc Af Amer: 92 mL/min/{1.73_m2} (ref >59)
GFR, EST NON AFRICAN AMERICAN: 80 mL/min/{1.73_m2} (ref >59)
GLOBULIN, TOTAL: 2.9 g/dL (ref 1.5–4.5)
GLUCOSE: 90 mg/dL (ref 65–99)
Potassium: 4.2 mmol/L (ref 3.5–5.2)
Sodium: 142 mmol/L (ref 134–144)
Total Protein: 7.6 g/dL (ref 6.0–8.5)

## 2016-09-10 NOTE — Telephone Encounter (Signed)
Called patient and left VM to give [place] a call back to schedule appt. Thanks.

## 2016-09-10 NOTE — Telephone Encounter (Signed)
yes

## 2016-09-10 NOTE — Telephone Encounter (Signed)
Pt called and wants to know if you will be willing to except her as a new patient? Please advise thanks.

## 2016-09-11 ENCOUNTER — Other Ambulatory Visit: Payer: Self-pay | Admitting: Obstetrics and Gynecology

## 2016-09-11 DIAGNOSIS — R1900 Intra-abdominal and pelvic swelling, mass and lump, unspecified site: Secondary | ICD-10-CM

## 2016-09-12 ENCOUNTER — Ambulatory Visit
Admission: RE | Admit: 2016-09-12 | Discharge: 2016-09-12 | Disposition: A | Discharge status: Home / Self Care | Payer: PRIVATE HEALTH INSURANCE | Source: Ambulatory Visit | Attending: Obstetrics and Gynecology | Admitting: Obstetrics and Gynecology

## 2016-09-12 DIAGNOSIS — R1900 Intra-abdominal and pelvic swelling, mass and lump, unspecified site: Secondary | ICD-10-CM

## 2016-09-12 MED ORDER — GADOBENATE DIMEGLUMINE 529 MG/ML IV SOLN
11.0000 mL | Freq: Once | INTRAVENOUS | Status: AC | PRN
  Administered 2016-09-12: 11 mL via INTRAVENOUS

## 2016-09-16 ENCOUNTER — Telehealth: Payer: Self-pay

## 2016-09-16 NOTE — Telephone Encounter (Signed)
New pt referral from Dr Sigmund Hazel, spoke with pt to verify appointment date and to arrive 77min piror to appt. 10/03/16 at 10:15am

## 2016-09-17 ENCOUNTER — Telehealth: Payer: Self-pay

## 2016-09-17 NOTE — Telephone Encounter (Signed)
Called to offer pt an appointment on 09/26/16 verses 10/03/16. Pt stated that she has made an appointment with Dr Orson Eva in [place], [place]. She will contact us if she wishes to cancel.

## 2016-09-21 ENCOUNTER — Other Ambulatory Visit: Payer: PRIVATE HEALTH INSURANCE

## 2016-10-01 ENCOUNTER — Ambulatory Visit (INDEPENDENT_AMBULATORY_CARE_PROVIDER_SITE_OTHER): Payer: PRIVATE HEALTH INSURANCE | Admitting: Internal Medicine

## 2016-10-01 ENCOUNTER — Encounter: Payer: Self-pay | Admitting: Internal Medicine

## 2016-10-01 VITALS — BP 108/70 | HR 66 | Temp 98.0°F | Resp 16 | Ht 63.5 in | Wt 126.8 lb

## 2016-10-01 DIAGNOSIS — R1903 Right lower quadrant abdominal swelling, mass and lump: Secondary | ICD-10-CM | POA: Diagnosis not present

## 2016-10-01 NOTE — Progress Notes (Signed)
Pre visit review using our clinic review tool, if applicable. No additional management support is needed unless otherwise documented below in the visit note. 

## 2016-10-01 NOTE — Progress Notes (Signed)
Subjective:  Patient ID: [name], female    DOB: Feb 22, 1951  Age: 66 y.o. MRN: [id number]  CC: Abdominal Pain  NEW TO - NEEDS A NEW PCP  HPI [name] presents for establishing a relationship with a new primary care physician. She is having surgery later this week at [facility] for a 12 cm cystic lesion in her right pelvis that they believe is attached to her ovary. For about 2 months now she has had back, abdominal, and pelvic pain. Her symptoms are not currently problematic.  History [name] has a past medical history of GERD (gastroesophageal reflux disease).   She has a past surgical history that includes disc ectomy; Tonsilectomy/adenoidectomy with myringotomy; Pleural scarification; pheumothorax ; and Appendectomy (2006).   Her family history includes Cancer in her father, maternal grandmother, mother, paternal grandfather, and paternal grandmother; Heart disease in her mother; Hyperlipidemia in her brother; Hypertension in her brother; Mental illness in her brother.She reports that she quit smoking about 20 years ago. She has never used smokeless tobacco. She reports that she does not drink alcohol or use drugs.  Outpatient Medications Prior to Visit  Medication Sig Dispense Refill  . aspirin 81 MG tablet Take 81 mg by mouth daily.    Marland Kitchen BIOTIN PO Take by mouth.    . Calcium Carbonate-Vitamin D (CALTRATE 600+D PO) Take by mouth daily.     . Multiple Vitamins-Minerals (MULTIVITAMIN WITH MINERALS) tablet Take 1 tablet by mouth daily.    . Omega-3 Fatty Acids (FISH OIL) 1000 MG CAPS Take by mouth.    . zoster vaccine live, PF, (ZOSTAVAX) 16109 UNT/0.65ML injection Inject 19,400 Units into the skin once. 1 each 0   No facility-administered medications prior to visit.     ROS Review of Systems  Constitutional: Negative for activity change, appetite change, diaphoresis, fatigue and unexpected weight change.  HENT: Negative.   Respiratory: Negative for cough, chest tightness,  shortness of breath and wheezing.   Cardiovascular: Negative for chest pain, palpitations and leg swelling.  Gastrointestinal: Positive for abdominal pain. Negative for constipation, diarrhea, nausea and vomiting.  Endocrine: Negative.   Genitourinary: Negative.  Negative for difficulty urinating.  Musculoskeletal: Positive for back pain. Negative for arthralgias, joint swelling and myalgias.  Skin: Negative.   Allergic/Immunologic: Negative.   Neurological: Negative.   Hematological: Negative for adenopathy. Does not bruise/bleed easily.  Psychiatric/Behavioral: Negative.     Objective:  BP 108/70 (BP Location: Left Arm, Patient Position: Sitting, Cuff Size: Normal)   Pulse 66   Temp 98 F (36.7 C) (Oral)   Resp 16   Ht 5' 3.5" (1.613 m)   Wt 126 lb 12 oz (57.5 kg)   SpO2 97%   BMI 22.10 kg/m   Physical Exam  Constitutional: She is oriented to person, place, and time. No distress.  HENT:  Mouth/Throat: Oropharynx is clear and moist. No oropharyngeal exudate.  Eyes: Conjunctivae are normal. Right eye exhibits no discharge. Left eye exhibits no discharge. No scleral icterus.  Neck: Normal range of motion. Neck supple. No JVD present. No tracheal deviation present. No thyromegaly present.  Cardiovascular: Normal rate, regular rhythm, normal heart sounds and intact distal pulses.  Exam reveals no gallop and no friction rub.   No murmur heard. Pulmonary/Chest: Effort normal and breath sounds normal. No stridor. No respiratory distress. She has no wheezes. She has no rales. She exhibits no tenderness.  Abdominal: Soft. Bowel sounds are normal. She exhibits mass (RLQ). She exhibits  no distension. There is no hepatosplenomegaly, splenomegaly or hepatomegaly. There is tenderness in the right lower quadrant and suprapubic area. There is no rebound, no guarding and no CVA tenderness.  Musculoskeletal: Normal range of motion. She exhibits no edema or tenderness.  Lymphadenopathy:    She has  no cervical adenopathy.  Neurological: She is oriented to person, place, and time.  Skin: Skin is warm and dry. No rash noted. She is not diaphoretic. No erythema. No pallor.  Psychiatric: She has a normal mood and affect. Her behavior is normal. Judgment and thought content normal.  Vitals reviewed.   Lab Results  Component Value Date   WBC 9.0 09/09/2016   HGB 13.8 09/09/2016   HCT 40.5 09/09/2016   PLT 319 10/09/2015   GLUCOSE 90 09/09/2016   CHOL 227 (H) 10/09/2015   TRIG 84 10/09/2015   HDL 78 10/09/2015   LDLCALC 132 (H) 10/09/2015   ALT 20 09/09/2016   AST 22 09/09/2016   NA 142 09/09/2016   K 4.2 09/09/2016   CL 99 09/09/2016   CREATININE 0.79 09/09/2016   BUN 13 09/09/2016   CO2 27 09/09/2016   TSH 1.769 10/09/2015   INR 1.0 08/25/2007    Assessment & Plan:   [name] was seen today for abdominal pain.  Diagnoses and all orders for this visit:  Abdominal mass, RLQ (right lower quadrant)- she has sx later this week at [facility], will be available to her when she returns to [place]   I am having [name] maintain her multivitamin with minerals, Fish Oil, aspirin, zoster vaccine live (PF), Calcium Carbonate-Vitamin D (CALTRATE 600+D PO), and BIOTIN PO.  No orders of the defined types were placed in this encounter.    Follow-up: No Follow-up on file.  Kristi Calico, MD

## 2016-10-03 ENCOUNTER — Ambulatory Visit: Payer: PRIVATE HEALTH INSURANCE | Admitting: Gynecologic Oncology

## 2016-10-03 DIAGNOSIS — R1903 Right lower quadrant abdominal swelling, mass and lump: Secondary | ICD-10-CM | POA: Insufficient documentation

## 2016-10-03 DIAGNOSIS — Z90710 Acquired absence of both cervix and uterus: Secondary | ICD-10-CM | POA: Insufficient documentation

## 2016-10-03 NOTE — Patient Instructions (Signed)

## 2016-10-19 ENCOUNTER — Other Ambulatory Visit: Payer: Self-pay | Admitting: Internal Medicine

## 2016-11-03 DIAGNOSIS — R1900 Intra-abdominal and pelvic swelling, mass and lump, unspecified site: Secondary | ICD-10-CM | POA: Insufficient documentation

## 2017-02-02 ENCOUNTER — Ambulatory Visit: Payer: PRIVATE HEALTH INSURANCE | Admitting: Family

## 2017-02-02 LAB — HM MAMMOGRAPHY

## 2017-02-05 ENCOUNTER — Ambulatory Visit (INDEPENDENT_AMBULATORY_CARE_PROVIDER_SITE_OTHER): Payer: PRIVATE HEALTH INSURANCE | Admitting: Family

## 2017-02-05 ENCOUNTER — Ambulatory Visit (INDEPENDENT_AMBULATORY_CARE_PROVIDER_SITE_OTHER)
Admission: RE | Admit: 2017-02-05 | Discharge: 2017-02-05 | Disposition: A | Discharge status: Home / Self Care | Payer: PRIVATE HEALTH INSURANCE | Source: Ambulatory Visit | Attending: Family | Admitting: Family

## 2017-02-05 ENCOUNTER — Encounter: Payer: Self-pay | Admitting: Family

## 2017-02-05 DIAGNOSIS — R6889 Other general symptoms and signs: Secondary | ICD-10-CM | POA: Diagnosis not present

## 2017-02-05 NOTE — Patient Instructions (Signed)
Thank you for choosing [facility].  SUMMARY AND INSTRUCTIONS:  It appears your symptoms are benign and may nerve related or possibly related to the cyst.  It is reassuring your MRI was normal.  We will obtain an x-ray today.  Follow up for worsening or if becomes painful.   Medication:  Your prescription(s) have been submitted to your pharmacy or been printed and provided for you. Please take as directed and contact our office if you believe you are having problem(s) with the medication(s) or have any questions.  Imaging / Radiology:  Please stop by radiology on the basement level of the building for your x-rays. Your results will be released to [facility] (or called to you) after review, usually within 72 hours after test completion. If any treatments or changes are necessary, you will be notified at that same time.  Follow up:  If your symptoms worsen or fail to improve, please contact our office for further instruction, or in case of emergency go directly to the emergency room at the closest medical facility.

## 2017-02-05 NOTE — Assessment & Plan Note (Signed)
Abnormal sensation located in the right upper quadrant below the ribs with no significant findings on exam. Previous MRI with no significant findings. Obtain x-ray. Question nerve related pathology or hypersensitivity or relation to recent pelvic mass given size. Recommend continue to monitor and follow up if symptoms worsen or become painful.

## 2017-02-05 NOTE — Progress Notes (Signed)
Subjective:    Patient ID: [name], female    DOB: March 19, 1951, 66 y.o.   MRN: [id number]  Chief Complaint  Patient presents with  . Other    feels like there is something under her rib cage on right side, does not hurt, sensitive feeling, wants chest xray    HPI:  [name] is a 66 y.o. female who  has a past medical history of GERD (gastroesophageal reflux disease). and presents today for an office visit.   This is a new problem. Associated symptom of feeling like something is located under her ribs on the right side just above the right upper quadrant. This has been going on for several months. Did recently have a surgery for a pelvic mass removed. There is no pain. Endorses some hypersensitivity to touch. Denies any modifying factors or attempted treatments. Denies nausea, vomiting, diarrhea, constipation or blood in stool.   No Known Allergies    Outpatient Medications Prior to Visit  Medication Sig Dispense Refill  . aspirin 81 MG tablet Take 81 mg by mouth daily.    Marland Kitchen BIOTIN PO Take by mouth.    . Calcium Carbonate-Vitamin D (CALTRATE 600+D PO) Take by mouth daily.     . Multiple Vitamins-Minerals (MULTIVITAMIN WITH MINERALS) tablet Take 1 tablet by mouth daily.    . Omega-3 Fatty Acids (FISH OIL) 1000 MG CAPS Take by mouth.    . zoster vaccine live, PF, (ZOSTAVAX) 62952 UNT/0.65ML injection Inject 19,400 Units into the skin once. (Patient not taking: Reported on 02/05/2017) 1 each 0   No facility-administered medications prior to visit.      Review of Systems  Constitutional: Negative for chills and fever.  Respiratory: Negative for chest tightness and shortness of breath.   Cardiovascular: Negative for chest pain, palpitations and leg swelling.  Gastrointestinal: Negative for abdominal distention, abdominal pain, anal bleeding, blood in stool, constipation, diarrhea, nausea, rectal pain and vomiting.      Objective:    BP 136/74 (BP Location: Left Arm, Patient  Position: Sitting, Cuff Size: Normal)   Pulse 66   Temp 98.1 F (36.7 C) (Oral)   Resp 16   Ht 5' 3.5" (1.613 m)   Wt 124 lb (56.2 kg)   SpO2 97%   BMI 21.62 kg/m  Nursing note and vital signs reviewed.  Physical Exam  Constitutional: She is oriented to person, place, and time. She appears well-developed and well-nourished. No distress.  Cardiovascular: Normal rate, regular rhythm, normal heart sounds and intact distal pulses.   Pulmonary/Chest: Effort normal and breath sounds normal. No respiratory distress. She has no wheezes. She has no rales. She exhibits no tenderness.  Abdominal: Soft. Bowel sounds are normal. She exhibits no distension and no mass. There is no tenderness. There is no rebound and no guarding.  Neurological: She is alert and oriented to person, place, and time.  Skin: Skin is warm and dry.  Psychiatric: She has a normal mood and affect. Her behavior is normal. Judgment and thought content normal.       Assessment & Plan:   Problem List Items Addressed This Visit      Other   Sensation of pressure    Abnormal sensation located in the right upper quadrant below the ribs with no significant findings on exam. Previous MRI with no significant findings. Obtain x-ray. Question nerve related pathology or hypersensitivity or relation to recent pelvic mass given size. Recommend continue to monitor and follow up if  symptoms worsen or become painful.       Relevant Orders   DG Chest 2 View (Completed)       I am having [name] maintain her multivitamin with minerals, Fish Oil, aspirin, zoster vaccine live (PF), Calcium Carbonate-Vitamin D (CALTRATE 600+D PO), and BIOTIN PO.   Follow-up: Return if symptoms worsen or fail to improve.  Mauricio Po, FNP

## 2017-02-11 ENCOUNTER — Encounter: Payer: Self-pay | Admitting: Internal Medicine

## 2017-03-16 IMAGING — US US PELVIS COMPLETE
1 series · 13 of 25 positions shown · non-contrast
Comparison: CT 03/16/2009 .

CLINICAL DATA: Abdominal pain.

EXAM:
TRANSABDOMINAL AND TRANSVAGINAL ULTRASOUND OF PELVIS
TECHNIQUE: Both transabdominal and transvaginal ultrasound examinations of the
pelvis were performed. Transabdominal technique was performed for
global imaging of the pelvis including uterus, ovaries, adnexal
regions, and pelvic cul-de-sac. It was necessary to proceed with
endovaginal exam following the transabdominal exam to visualize the
uterus and ovaries.

[Series 1: us pelvis complete · 0.23mm/px · 114 acquisitions, 13 frames shown]
[im 1/114]
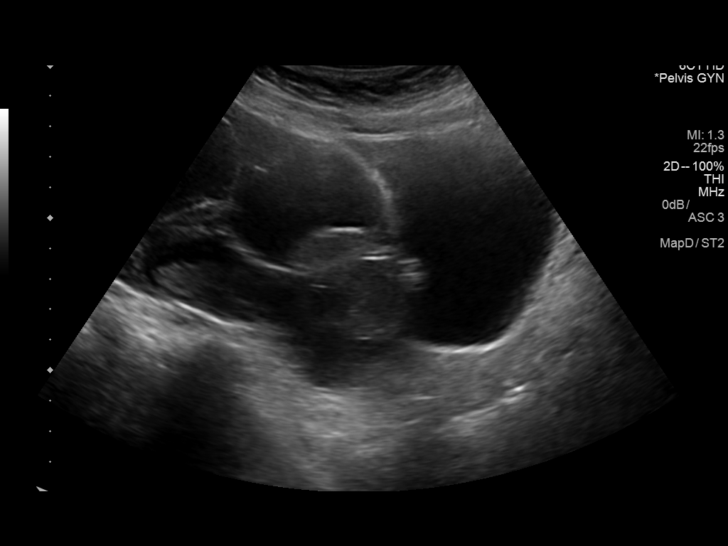
[im 10/114]
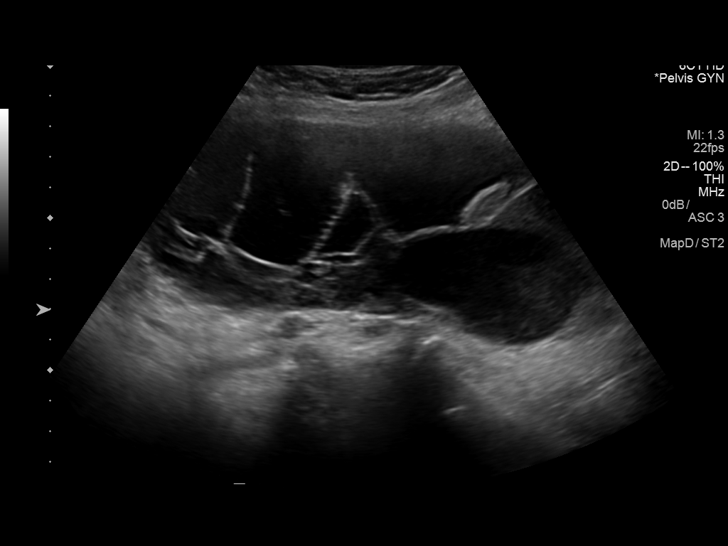
[im 19/114]
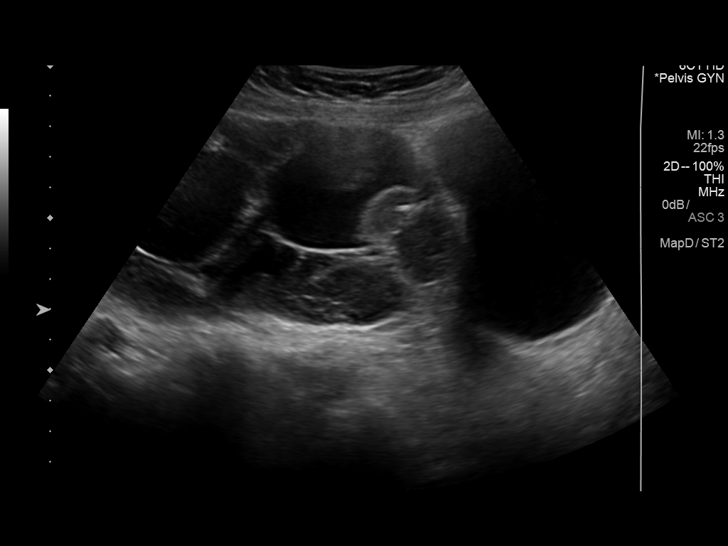
[im 29/114]
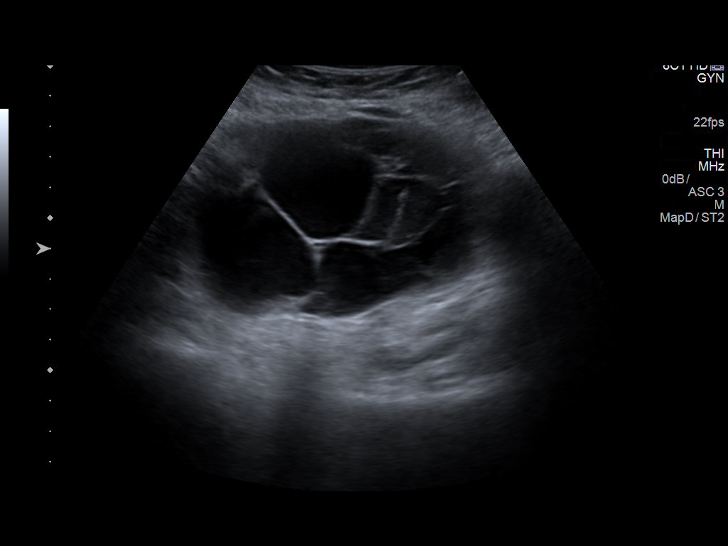
[im 38/114]
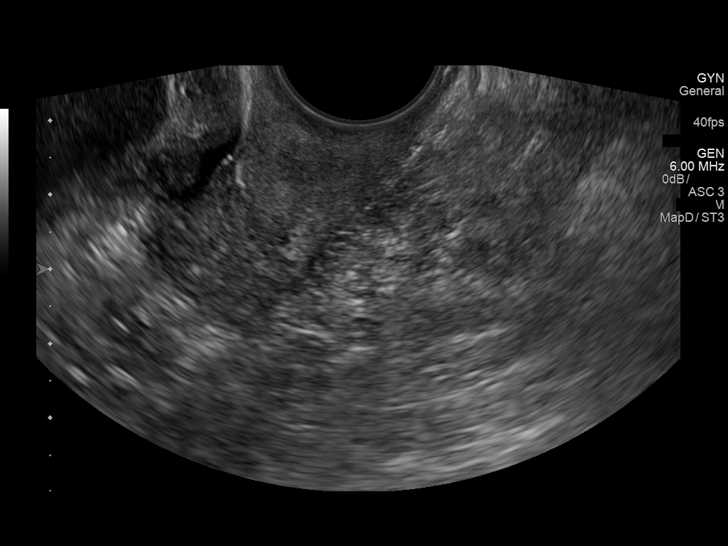
[im 48/114]
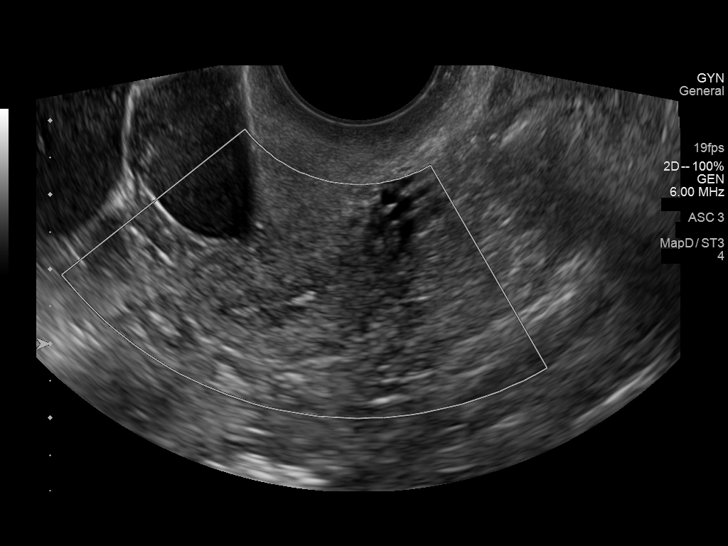
[im 57/114]
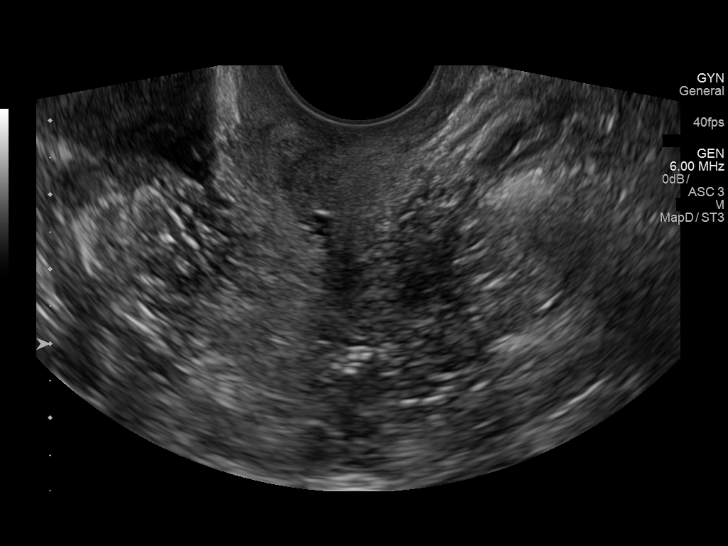
[im 66/114]
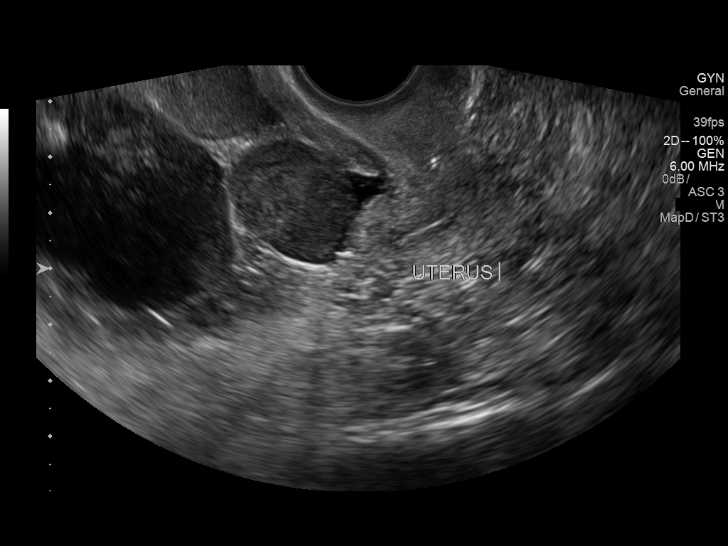
[im 76/114]
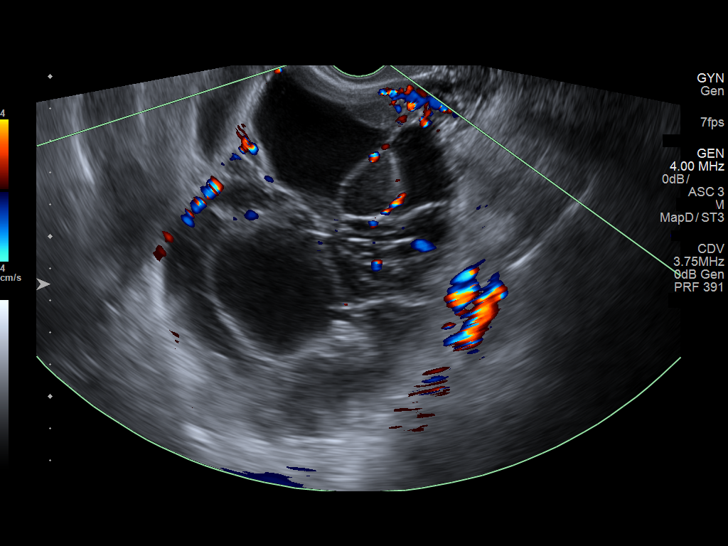
[im 85/114]
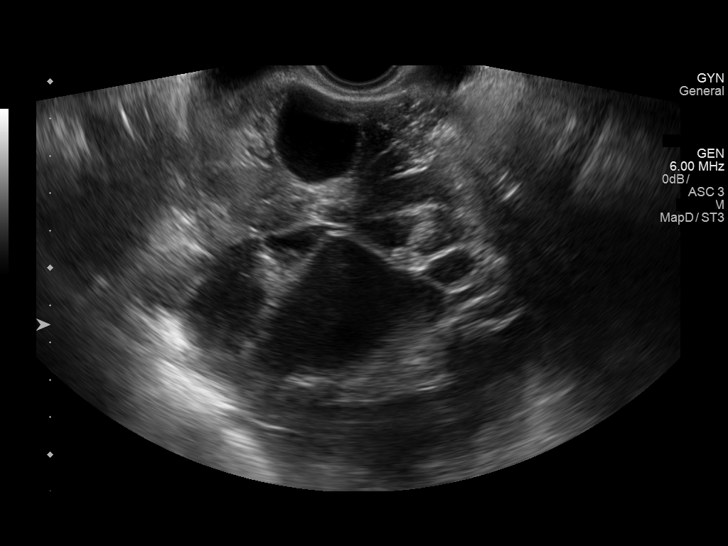
[im 95/114]
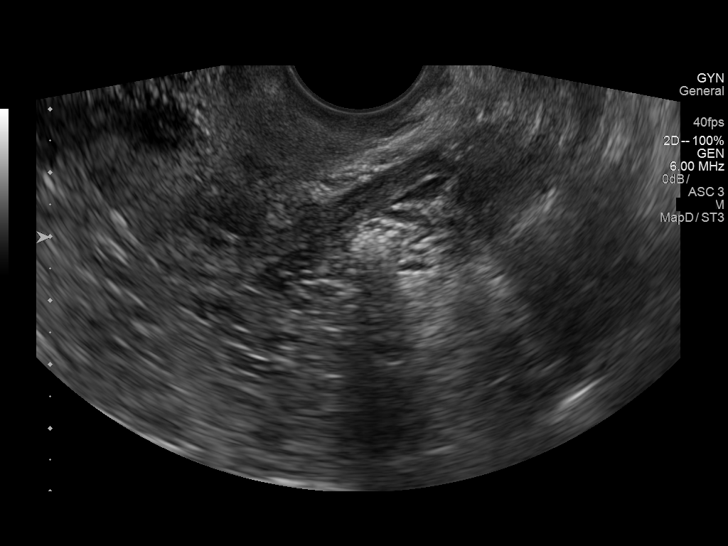
[im 104/114]
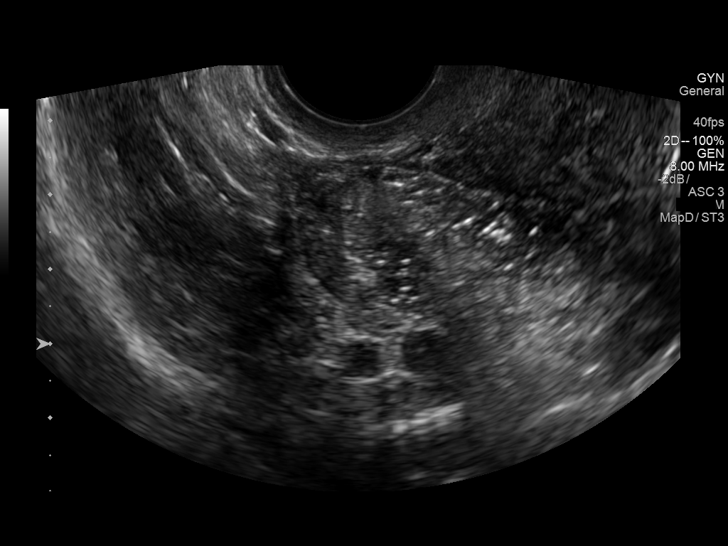
[im 114/114]
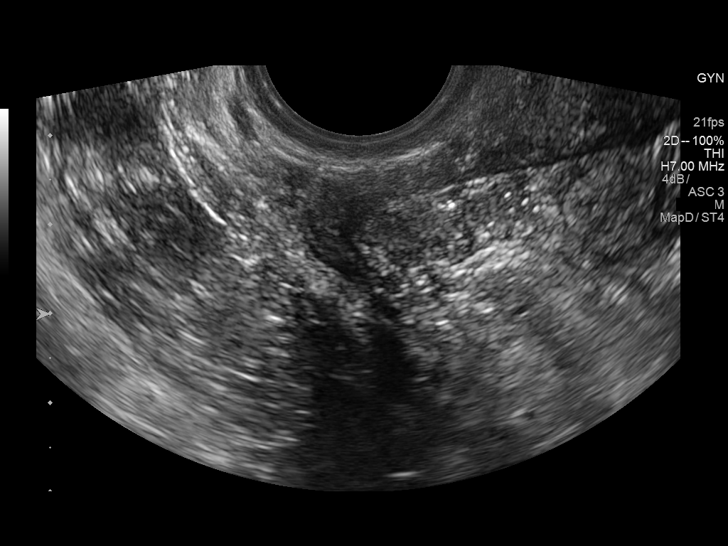

[13 of 25 positions shown; findings below may reference images not displayed]

FINDINGS: Uterus

Measurements: 4.6 x 1.6 x 3.2 cm. No fibroids or other mass
visualized.

Endometrium

Thickness: 2.6 mm.  No focal abnormality visualized.

Right ovary

Measurements: 1.5 x 0.6 x 0.8 cm. Normal appearance/no adnexal mass.

Left ovary

Measurements: 0.7 x 0.4 x 0.7 cm. Normal appearance/no adnexal mass.

Other findings

Trace free pelvic fluid.

A 14.7 x 7.5 x 10.9 cm complex cystic mass is noted superior to the
uterus. This could be a tumor of gynecologic origin. This could also
be a cystic tumor of other origin. Other etiologies cystic lesions
cannot be excluded. Gynecologic evaluation suggested .
IMPRESSION: A complex septated cystic mass measuring 14.7 x 7.5 x 10.9 cm is
noted superior to the uterus. This could be a cystic tumor of
gynecologic origin. Other etiologies of cystic tumor and/or cystic
lesions cannot be excluded. Associated trace ascites. Gynecologic
evaluation suggested.

## 2017-06-18 ENCOUNTER — Ambulatory Visit (INDEPENDENT_AMBULATORY_CARE_PROVIDER_SITE_OTHER)
Admission: RE | Admit: 2017-06-18 | Discharge: 2017-06-18 | Disposition: A | Discharge status: Home / Self Care | Payer: PRIVATE HEALTH INSURANCE | Source: Ambulatory Visit | Attending: Internal Medicine | Admitting: Internal Medicine

## 2017-06-18 ENCOUNTER — Encounter: Payer: Self-pay | Admitting: Internal Medicine

## 2017-06-18 ENCOUNTER — Other Ambulatory Visit (INDEPENDENT_AMBULATORY_CARE_PROVIDER_SITE_OTHER): Payer: PRIVATE HEALTH INSURANCE

## 2017-06-18 ENCOUNTER — Ambulatory Visit (INDEPENDENT_AMBULATORY_CARE_PROVIDER_SITE_OTHER): Payer: PRIVATE HEALTH INSURANCE | Admitting: Internal Medicine

## 2017-06-18 VITALS — BP 110/70 | HR 70 | Temp 97.7°F | Resp 16 | Ht 63.5 in | Wt 125.0 lb

## 2017-06-18 DIAGNOSIS — R1011 Right upper quadrant pain: Secondary | ICD-10-CM

## 2017-06-18 DIAGNOSIS — Z0000 Encounter for general adult medical examination without abnormal findings: Secondary | ICD-10-CM

## 2017-06-18 DIAGNOSIS — Z23 Encounter for immunization: Secondary | ICD-10-CM | POA: Diagnosis not present

## 2017-06-18 LAB — COMPREHENSIVE METABOLIC PANEL
ALK PHOS: 70 U/L (ref 39–117)
ALT: 16 U/L (ref 0–35)
AST: 19 U/L (ref 0–37)
Albumin: 4.5 g/dL (ref 3.5–5.2)
BUN: 9 mg/dL (ref 6–23)
CO2: 30 meq/L (ref 19–32)
Calcium: 9.9 mg/dL (ref 8.4–10.5)
Chloride: 101 mEq/L (ref 96–112)
Creatinine, Ser: 0.62 mg/dL (ref 0.40–1.20)
GFR: 102.92 mL/min (ref >60.00)
GLUCOSE: 99 mg/dL (ref 70–99)
POTASSIUM: 3.6 meq/L (ref 3.5–5.1)
SODIUM: 139 meq/L (ref 135–145)
TOTAL PROTEIN: 7.8 g/dL (ref 6.0–8.3)
Total Bilirubin: 0.5 mg/dL (ref 0.2–1.2)

## 2017-06-18 LAB — URINALYSIS, ROUTINE W REFLEX MICROSCOPIC
Bilirubin Urine: NEGATIVE
HGB URINE DIPSTICK: NEGATIVE
Ketones, ur: NEGATIVE
NITRITE: NEGATIVE
Total Protein, Urine: NEGATIVE
URINE GLUCOSE: NEGATIVE
Urobilinogen, UA: 0.2 (ref 0.0–1.0)
pH: 7 (ref 5.0–8.0)

## 2017-06-18 LAB — CBC WITH DIFFERENTIAL/PLATELET
BASOS PCT: 0.5 % (ref 0.0–3.0)
Basophils Absolute: 0 10*3/uL (ref 0.0–0.1)
EOS ABS: 0.1 10*3/uL (ref 0.0–0.7)
Eosinophils Relative: 1.8 % (ref 0.0–5.0)
HCT: 41 % (ref 36.0–46.0)
HEMOGLOBIN: 13.6 g/dL (ref 12.0–15.0)
LYMPHS ABS: 2.8 10*3/uL (ref 0.7–4.0)
Lymphocytes Relative: 36.4 % (ref 12.0–46.0)
MCHC: 33.2 g/dL (ref 30.0–36.0)
MCV: 84.2 fl (ref 78.0–100.0)
MONO ABS: 0.7 10*3/uL (ref 0.1–1.0)
Monocytes Relative: 9 % (ref 3.0–12.0)
NEUTROS PCT: 52.3 % (ref 43.0–77.0)
Neutro Abs: 4 10*3/uL (ref 1.4–7.7)
PLATELETS: 361 10*3/uL (ref 150.0–400.0)
RBC: 4.87 Mil/uL (ref 3.87–5.11)
RDW: 14.4 % (ref 11.5–15.5)
WBC: 7.6 10*3/uL (ref 4.0–10.5)

## 2017-06-18 LAB — LIPID PANEL
CHOL/HDL RATIO: 3
Cholesterol: 260 mg/dL — ABNORMAL HIGH (ref 0–200)
HDL: 76.2 mg/dL (ref >39.00)
LDL Cholesterol: 170 mg/dL — ABNORMAL HIGH (ref 0–99)
NONHDL: 184.15
Triglycerides: 71 mg/dL (ref 0.0–149.0)
VLDL: 14.2 mg/dL (ref 0.0–40.0)

## 2017-06-18 LAB — TSH: TSH: 1.63 u[IU]/mL (ref 0.35–4.50)

## 2017-06-18 LAB — LIPASE: Lipase: 18 U/L (ref 11.0–59.0)

## 2017-06-18 LAB — AMYLASE: Amylase: 25 U/L — ABNORMAL LOW (ref 27–131)

## 2017-06-18 NOTE — Patient Instructions (Signed)

## 2017-06-18 NOTE — Progress Notes (Signed)
Subjective:  Patient ID: [name], female    DOB: July 16, 1951  Age: 66 y.o. MRN: [id number]  CC: Annual Exam and Abdominal Pain   HPI [name] presents for a CPX.   She complains of an uncomfortable sensation in her right upper quadrant under her rib cage for several months. She describes it as a dull ache that increases with meals and when she leans forward. The skin also feels sensitive. The pain can also wrap around into the right flank. She denies rash, loss of appetite, nausea, vomiting, diarrhea, constipation, melena, blood in her stool, or hematuria. She had a benign ovarian tumor removed about 9 months ago. The MRI prior to that showed that she had some benign cystic lesions in her liver but the gallbladder appeared normal.  Outpatient Medications Prior to Visit  Medication Sig Dispense Refill  . aspirin 81 MG tablet Take 81 mg by mouth daily.    Marland Kitchen BIOTIN PO Take by mouth.    . Calcium Carbonate-Vitamin D (CALTRATE 600+D PO) Take by mouth daily.     . Omega-3 Fatty Acids (FISH OIL) 1000 MG CAPS Take by mouth.    . Multiple Vitamins-Minerals (MULTIVITAMIN WITH MINERALS) tablet Take 1 tablet by mouth daily.    Marland Kitchen zoster vaccine live, PF, (ZOSTAVAX) 54008 UNT/0.65ML injection Inject 19,400 Units into the skin once. (Patient not taking: Reported on 02/05/2017) 1 each 0   No facility-administered medications prior to visit.     ROS Review of Systems  Constitutional: Negative.  Negative for chills, diaphoresis, fatigue and fever.  HENT: Negative.  Negative for trouble swallowing.   Eyes: Negative.   Respiratory: Negative.  Negative for cough, chest tightness, shortness of breath and wheezing.   Cardiovascular: Negative for chest pain, palpitations and leg swelling.  Gastrointestinal: Positive for abdominal pain. Negative for abdominal distention, blood in stool, constipation, diarrhea, nausea and vomiting.  Endocrine: Negative.   Genitourinary: Positive for flank pain.  Negative for decreased urine volume, difficulty urinating, dysuria, frequency, hematuria, urgency, vaginal bleeding, vaginal discharge and vaginal pain.  Musculoskeletal: Negative for back pain and myalgias.  Skin: Negative.  Negative for color change and rash.  Allergic/Immunologic: Negative.   Neurological: Negative.  Negative for dizziness and weakness.  Hematological: Negative for adenopathy. Does not bruise/bleed easily.  Psychiatric/Behavioral: Negative.     Objective:  BP 110/70 (BP Location: Left Arm, Patient Position: Sitting, Cuff Size: Normal)   Pulse 70   Temp 97.7 F (36.5 C) (Oral)   Resp 16   Ht 5' 3.5" (1.613 m)   Wt 125 lb (56.7 kg)   SpO2 98%   BMI 21.80 kg/m   BP Readings from Last 3 Encounters:  06/18/17 110/70  02/05/17 136/74  10/01/16 108/70    Wt Readings from Last 3 Encounters:  06/18/17 125 lb (56.7 kg)  02/05/17 124 lb (56.2 kg)  10/01/16 126 lb 12 oz (57.5 kg)    Physical Exam  Constitutional: She is oriented to person, place, and time. No distress.  HENT:  Mouth/Throat: Oropharynx is clear and moist. No oropharyngeal exudate.  Eyes: Conjunctivae are normal. Right eye exhibits no discharge. Left eye exhibits no discharge. No scleral icterus.  Neck: Normal range of motion. Neck supple. No JVD present. No thyromegaly present.  Cardiovascular: Normal rate, regular rhythm and intact distal pulses.  Exam reveals no gallop.   No murmur heard. Pulmonary/Chest: Effort normal and breath sounds normal. No respiratory distress. She has no wheezes. She has no rales.  She exhibits no tenderness.  Abdominal: Soft. Bowel sounds are normal. She exhibits no distension and no mass. There is no hepatosplenomegaly or hepatomegaly. There is tenderness in the right upper quadrant. There is no rigidity, no rebound, no guarding, no CVA tenderness, no tenderness at McBurney's point and negative Murphy's sign. No hernia. Hernia confirmed negative in the ventral area,  confirmed negative in the right inguinal area and confirmed negative in the left inguinal area.  Musculoskeletal: Normal range of motion. She exhibits no edema, tenderness or deformity.  Lymphadenopathy:    She has no cervical adenopathy.  Neurological: She is alert and oriented to person, place, and time.  Skin: Skin is warm and dry. No rash noted. She is not diaphoretic. No erythema. No pallor.  Psychiatric: She has a normal mood and affect. Her behavior is normal. Judgment and thought content normal.  Vitals reviewed.   Lab Results  Component Value Date   WBC 7.6 06/18/2017   HGB 13.6 06/18/2017   HCT 41.0 06/18/2017   PLT 361.0 06/18/2017   GLUCOSE 99 06/18/2017   CHOL 260 (H) 06/18/2017   TRIG 71.0 06/18/2017   HDL 76.20 06/18/2017   LDLCALC 170 (H) 06/18/2017   ALT 16 06/18/2017   AST 19 06/18/2017   NA 139 06/18/2017   K 3.6 06/18/2017   CL 101 06/18/2017   CREATININE 0.62 06/18/2017   BUN 9 06/18/2017   CO2 30 06/18/2017   TSH 1.63 06/18/2017   INR 1.0 08/25/2007    Dg Chest 2 View  Result Date: 02/05/2017 CLINICAL DATA:  Former smoker, pressures sensation of the right ribcage. EXAM: CHEST  2 VIEW COMPARISON:  None in PACs FINDINGS: The lungs are mildly hyperinflated with hemidiaphragm flattening. The heart and pulmonary vascularity are normal. The mediastinum is normal in width. There is faint calcification in the wall of the aortic arch. There is no pleural effusion. There is gentle curvature convex toward the right centered at approximately T6. IMPRESSION: Mild chronic bronchitic-smoking related changes. No pneumonia, CHF, nor other acute cardiopulmonary abnormality. Thoracic aortic atherosclerosis. Electronically Signed   By: David  Martinique M.D.   On: 02/05/2017 11:19   Dg Abd Acute W/chest  Result Date: 06/18/2017 CLINICAL DATA:  Upper quadrant heaviness for the past 8 months. Former smoker. EXAM: DG ABDOMEN ACUTE W/ 1V CHEST COMPARISON:  Chest x-ray of Feb 05, 2017  FINDINGS: The lungs remain hyperinflated. There is no focal infiltrate. There is no pleural effusion. The heart and pulmonary vascularity are normal. The mediastinum is normal in width. Within the abdomen the bowel gas pattern is normal. There are no abnormal soft tissue calcifications. There is gentle S shaped thoracolumbar scoliosis which is stable. IMPRESSION: There is no active cardiopulmonary disease. Stable chronic bronchitic -smoking related changes. Electronically Signed   By: David  Martinique M.D.   On: 06/18/2017 11:31    Assessment & Plan:   [name] was seen today for annual exam and abdominal pain.  Diagnoses and all orders for this visit:  Routine general medical examination at a health care facility- exam completed, labs reviewed, vaccines reviewed and updated, colon cancer screening/Pap smear/mammogram are all up-to-date, patient education material was given. -     CBC with Differential/Platelet; Future -     Comprehensive metabolic panel; Future -     Lipid panel; Future -     TSH; Future  Abdominal pain, RUQ- her exam is not consistent with an acute abdominal process. Her labs are unremarkable as well as plain  films. I've ordered a CT of the abdomen and pelvis with contrast to see if there is a mass, renal lesion, recurrence of the ovarian cyst/metastatic disease, liver mass, etc. -     Lipase; Future -     Amylase; Future -     Urinalysis, Routine w reflex microscopic; Future -     DG Abd Acute W/Chest; Future  Need for influenza vaccination -     Flu Vaccine QUAD 36+ mos IM   I have discontinued Ms. Klugh's multivitamin with minerals and zoster vaccine live (PF). I am also having her maintain her Fish Oil, aspirin, Calcium Carbonate-Vitamin D (CALTRATE 600+D PO), BIOTIN PO, and YUVAFEM.  Meds ordered this encounter  Medications  . YUVAFEM 10 MCG TABS vaginal tablet    Sig: INSERT ONE TAB PER VAGINA NIGHTLY FOR 2 WEEKS,, THEN REDUCE TO 1-2X PER WEEK    Refill:  6      Follow-up: Return in about 4 weeks (around 07/16/2017).  Scarlette Calico, MD

## 2017-07-02 ENCOUNTER — Ambulatory Visit (INDEPENDENT_AMBULATORY_CARE_PROVIDER_SITE_OTHER)
Admission: RE | Admit: 2017-07-02 | Discharge: 2017-07-02 | Disposition: A | Discharge status: Home / Self Care | Payer: PRIVATE HEALTH INSURANCE | Source: Ambulatory Visit | Attending: Internal Medicine | Admitting: Internal Medicine

## 2017-07-02 DIAGNOSIS — R1011 Right upper quadrant pain: Secondary | ICD-10-CM | POA: Diagnosis not present

## 2017-07-02 MED ORDER — IOPAMIDOL (ISOVUE-300) INJECTION 61%: 100.0000 mL | Freq: Once | INTRAVENOUS | Status: DC | PRN

## 2017-07-03 ENCOUNTER — Encounter: Payer: Self-pay | Admitting: Internal Medicine

## 2017-07-20 ENCOUNTER — Encounter: Payer: Self-pay | Admitting: Internal Medicine

## 2017-07-20 ENCOUNTER — Ambulatory Visit (INDEPENDENT_AMBULATORY_CARE_PROVIDER_SITE_OTHER): Payer: PRIVATE HEALTH INSURANCE | Admitting: Internal Medicine

## 2017-07-20 DIAGNOSIS — R6884 Jaw pain: Secondary | ICD-10-CM | POA: Diagnosis not present

## 2017-07-20 DIAGNOSIS — M26629 Arthralgia of temporomandibular joint, unspecified side: Secondary | ICD-10-CM | POA: Insufficient documentation

## 2017-07-20 DIAGNOSIS — H9201 Otalgia, right ear: Secondary | ICD-10-CM | POA: Diagnosis not present

## 2017-07-20 NOTE — Patient Instructions (Signed)
Temporomandibular Joint Syndrome Temporomandibular joint (TMJ) syndrome is a condition that affects the joints between your jaw and your skull. The TMJs are located near your ears and allow your jaw to open and close. These joints and the nearby muscles are involved in all movements of the jaw. People with TMJ syndrome have pain in the area of these joints and muscles. Chewing, biting, or other movements of the jaw can be difficult or painful. TMJ syndrome can be caused by various things. In many cases, the condition is mild and goes away within a few weeks. For some people, the condition can become a long-term problem. What are the causes? Possible causes of TMJ syndrome include:  Grinding your teeth or clenching your jaw. Some people do this when they are under stress.  Arthritis.  Injury to the jaw.  Head or neck injury.  Teeth or dentures that are not aligned well.  In some cases, the cause of TMJ syndrome may not be known. What are the signs or symptoms? The most common symptom is an aching pain on the side of the head in the area of the TMJ. Other symptoms may include:  Pain when moving your jaw, such as when chewing or biting.  Being unable to open your jaw all the way.  Making a clicking sound when you open your mouth.  Headache.  Earache.  Neck or shoulder pain.  How is this diagnosed? Diagnosis can usually be made based on your symptoms, your medical history, and a physical exam. Your health care provider may check the range of motion of your jaw. Imaging tests, such as X-rays or an MRI, are sometimes done. You may need to see your dentist to determine if your teeth and jaw are lined up correctly. How is this treated? TMJ syndrome often goes away on its own. If treatment is needed, the options may include:  Eating soft foods and applying ice or heat.  Medicines to relieve pain or inflammation.  Medicines to relax the muscles.  A splint, bite plate, or mouthpiece  to prevent teeth grinding or jaw clenching.  Relaxation techniques or counseling to help reduce stress.  Transcutaneous electrical nerve stimulation (TENS). This helps to relieve pain by applying an electrical current through the skin.  Acupuncture. This is sometimes helpful to relieve pain.  Jaw surgery. This is rarely needed.  Follow these instructions at home:  Take medicines only as directed by your health care provider.  Eat a soft diet if you are having trouble chewing.  Apply ice to the painful area. ? Put ice in a plastic bag. ? Place a towel between your skin and the bag. ? Leave the ice on for 20 minutes, 2-3 times a day.  Apply a warm compress to the painful area as directed.  Massage your jaw area and perform any jaw stretching exercises as recommended by your health care provider.  If you were given a mouthpiece or bite plate, wear it as directed.  Avoid foods that require a lot of chewing. Do not chew gum.  Keep all follow-up visits as directed by your health care provider. This is important. Contact a health care provider if:  You are having trouble eating.  You have new or worsening symptoms. Get help right away if:  Your jaw locks open or closed. This information is not intended to replace advice given to you by your health care provider. Make sure you discuss any questions you have with your health care provider. Document   Released: 06/03/2001 Document Revised: 05/08/2016 Document Reviewed: 04/13/2014 Elsevier Interactive Patient Education  2018 [employer]  

## 2017-07-20 NOTE — Assessment & Plan Note (Addendum)
Advised to use ibuprofen or tylenol for pain. Ice as needed. Talked about prevention of recurrent symptoms. She will go to dentist for bite guard to use. If persistent follow up with dentist to check for problems.

## 2017-07-20 NOTE — Progress Notes (Addendum)
   Subjective:    Patient ID: [name], female    DOB: 02-Sep-1951, 66 y.o.   MRN: [id number]  HPI The patient is a 66 YO female coming in for right jaw pain. Started about 2 days ago. Denies fevers or chills. No cough or nose drainage. Some ear congestion on the right. She does have jaw grinding and sometimes gets pain. Has tried ibuprofen and tylenol in past flares which did not help. She has not used anything this time. Overall mildly improving. Denies dental problem or broken tooth. Just saw dentist in the last week or two and no problems.   Review of Systems  Constitutional: Negative for activity change, appetite change, fatigue, fever and unexpected weight change.  HENT: Positive for ear pain. Negative for congestion, dental problem, ear discharge, facial swelling, nosebleeds, postnasal drip, sneezing, sore throat and trouble swallowing.   Respiratory: Negative.   Cardiovascular: Negative.   Gastrointestinal: Negative.   Musculoskeletal: Negative.   Skin: Negative.   Neurological: Negative.       Objective:   Physical Exam  Constitutional: She is oriented to person, place, and time. She appears well-developed and well-nourished.  HENT:  Head: Normocephalic and atraumatic.  Right Ear: External ear normal.  Left Ear: External ear normal.  Right and left TM normal, some wax in the right canal not blocking the canal. Some pain over the right TMJ joint, no CAD  Eyes: Pupils are equal, round, and reactive to light. EOM are normal.  Neck: No JVD present. No thyromegaly present.  Cardiovascular: Normal rate and regular rhythm.   Pulmonary/Chest: Effort normal.  Abdominal: Soft. Bowel sounds are normal.  Lymphadenopathy:    She has no cervical adenopathy.  Neurological: She is alert and oriented to person, place, and time.  Skin: Skin is warm and dry.   Vitals:   07/20/17 1619  BP: 128/62  Pulse: 68  Temp: 97.8 F (36.6 C)  TempSrc: Oral  SpO2: 99%  Weight: 123 lb (55.8 kg)    Height: 5' 3.5" (1.613 m)      Assessment & Plan:

## 2017-08-03 LAB — HM COLONOSCOPY

## 2017-10-09 ENCOUNTER — Telehealth: Payer: Self-pay

## 2017-10-09 NOTE — Telephone Encounter (Signed)
Pt contacted today due to a letter sent by [facility] requesting CT scan results. Contacted pt and informed of same. Pt will pick up a copy of the letter and the CT results to submit.

## 2018-02-10 LAB — HM MAMMOGRAPHY

## 2018-02-19 ENCOUNTER — Encounter: Payer: Self-pay | Admitting: Internal Medicine

## 2018-03-15 ENCOUNTER — Ambulatory Visit (INDEPENDENT_AMBULATORY_CARE_PROVIDER_SITE_OTHER): Payer: Self-pay

## 2018-03-15 ENCOUNTER — Encounter (INDEPENDENT_AMBULATORY_CARE_PROVIDER_SITE_OTHER): Payer: Self-pay | Admitting: Orthopaedic Surgery

## 2018-03-15 ENCOUNTER — Ambulatory Visit (INDEPENDENT_AMBULATORY_CARE_PROVIDER_SITE_OTHER): Payer: PPO | Admitting: Orthopaedic Surgery

## 2018-03-15 VITALS — BP 136/86 | HR 77 | Ht 63.0 in | Wt 120.0 lb

## 2018-03-15 DIAGNOSIS — M542 Cervicalgia: Secondary | ICD-10-CM

## 2018-03-15 DIAGNOSIS — S92301A Fracture of unspecified metatarsal bone(s), right foot, initial encounter for closed fracture: Secondary | ICD-10-CM | POA: Insufficient documentation
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
## 2018-05-06 DIAGNOSIS — M779 Enthesopathy, unspecified: Secondary | ICD-10-CM | POA: Diagnosis not present

## 2018-05-06 DIAGNOSIS — M21962 Unspecified acquired deformity of left lower leg: Secondary | ICD-10-CM | POA: Diagnosis not present

## 2018-05-06 DIAGNOSIS — M79672 Pain in left foot: Secondary | ICD-10-CM | POA: Diagnosis not present

## 2018-05-17 DIAGNOSIS — S338XXA Sprain of other parts of lumbar spine and pelvis, initial encounter: Secondary | ICD-10-CM | POA: Diagnosis not present

## 2018-05-17 DIAGNOSIS — M9902 Segmental and somatic dysfunction of thoracic region: Secondary | ICD-10-CM | POA: Diagnosis not present

## 2018-05-17 DIAGNOSIS — M5137 Other intervertebral disc degeneration, lumbosacral region: Secondary | ICD-10-CM | POA: Diagnosis not present

## 2018-05-17 DIAGNOSIS — S29012A Strain of muscle and tendon of back wall of thorax, initial encounter: Secondary | ICD-10-CM | POA: Diagnosis not present

## 2018-05-17 DIAGNOSIS — M9904 Segmental and somatic dysfunction of sacral region: Secondary | ICD-10-CM | POA: Diagnosis not present

## 2018-05-17 DIAGNOSIS — M9905 Segmental and somatic dysfunction of pelvic region: Secondary | ICD-10-CM | POA: Diagnosis not present

## 2018-05-17 DIAGNOSIS — M9903 Segmental and somatic dysfunction of lumbar region: Secondary | ICD-10-CM | POA: Diagnosis not present

## 2018-05-17 DIAGNOSIS — S39012A Strain of muscle, fascia and tendon of lower back, initial encounter: Secondary | ICD-10-CM | POA: Diagnosis not present

## 2018-05-20 DIAGNOSIS — M9905 Segmental and somatic dysfunction of pelvic region: Secondary | ICD-10-CM | POA: Diagnosis not present

## 2018-05-20 DIAGNOSIS — M9904 Segmental and somatic dysfunction of sacral region: Secondary | ICD-10-CM | POA: Diagnosis not present

## 2018-05-20 DIAGNOSIS — M9902 Segmental and somatic dysfunction of thoracic region: Secondary | ICD-10-CM | POA: Diagnosis not present

## 2018-05-20 DIAGNOSIS — S39012A Strain of muscle, fascia and tendon of lower back, initial encounter: Secondary | ICD-10-CM | POA: Diagnosis not present

## 2018-05-20 DIAGNOSIS — M9903 Segmental and somatic dysfunction of lumbar region: Secondary | ICD-10-CM | POA: Diagnosis not present

## 2018-05-20 DIAGNOSIS — S29012A Strain of muscle and tendon of back wall of thorax, initial encounter: Secondary | ICD-10-CM | POA: Diagnosis not present

## 2018-05-20 DIAGNOSIS — S338XXA Sprain of other parts of lumbar spine and pelvis, initial encounter: Secondary | ICD-10-CM | POA: Diagnosis not present

## 2018-05-20 DIAGNOSIS — M5137 Other intervertebral disc degeneration, lumbosacral region: Secondary | ICD-10-CM | POA: Diagnosis not present

## 2018-05-25 DIAGNOSIS — M9905 Segmental and somatic dysfunction of pelvic region: Secondary | ICD-10-CM | POA: Diagnosis not present

## 2018-05-25 DIAGNOSIS — M9903 Segmental and somatic dysfunction of lumbar region: Secondary | ICD-10-CM | POA: Diagnosis not present

## 2018-05-25 DIAGNOSIS — M9904 Segmental and somatic dysfunction of sacral region: Secondary | ICD-10-CM | POA: Diagnosis not present

## 2018-05-25 DIAGNOSIS — S39012A Strain of muscle, fascia and tendon of lower back, initial encounter: Secondary | ICD-10-CM | POA: Diagnosis not present

## 2018-05-25 DIAGNOSIS — S29012A Strain of muscle and tendon of back wall of thorax, initial encounter: Secondary | ICD-10-CM | POA: Diagnosis not present

## 2018-05-25 DIAGNOSIS — M5137 Other intervertebral disc degeneration, lumbosacral region: Secondary | ICD-10-CM | POA: Diagnosis not present

## 2018-05-25 DIAGNOSIS — S338XXA Sprain of other parts of lumbar spine and pelvis, initial encounter: Secondary | ICD-10-CM | POA: Diagnosis not present

## 2018-05-25 DIAGNOSIS — M9902 Segmental and somatic dysfunction of thoracic region: Secondary | ICD-10-CM | POA: Diagnosis not present

## 2018-05-27 DIAGNOSIS — M5137 Other intervertebral disc degeneration, lumbosacral region: Secondary | ICD-10-CM | POA: Diagnosis not present

## 2018-05-27 DIAGNOSIS — S29012A Strain of muscle and tendon of back wall of thorax, initial encounter: Secondary | ICD-10-CM | POA: Diagnosis not present

## 2018-05-27 DIAGNOSIS — M9903 Segmental and somatic dysfunction of lumbar region: Secondary | ICD-10-CM | POA: Diagnosis not present

## 2018-05-27 DIAGNOSIS — M779 Enthesopathy, unspecified: Secondary | ICD-10-CM | POA: Diagnosis not present

## 2018-05-27 DIAGNOSIS — M9905 Segmental and somatic dysfunction of pelvic region: Secondary | ICD-10-CM | POA: Diagnosis not present

## 2018-05-27 DIAGNOSIS — M9902 Segmental and somatic dysfunction of thoracic region: Secondary | ICD-10-CM | POA: Diagnosis not present

## 2018-05-27 DIAGNOSIS — S39012A Strain of muscle, fascia and tendon of lower back, initial encounter: Secondary | ICD-10-CM | POA: Diagnosis not present

## 2018-05-27 DIAGNOSIS — M21962 Unspecified acquired deformity of left lower leg: Secondary | ICD-10-CM | POA: Diagnosis not present

## 2018-05-27 DIAGNOSIS — M9904 Segmental and somatic dysfunction of sacral region: Secondary | ICD-10-CM | POA: Diagnosis not present

## 2018-05-27 DIAGNOSIS — S338XXA Sprain of other parts of lumbar spine and pelvis, initial encounter: Secondary | ICD-10-CM | POA: Diagnosis not present

## 2018-06-07 ENCOUNTER — Telehealth: Payer: Self-pay | Admitting: Internal Medicine

## 2018-06-07 NOTE — Telephone Encounter (Signed)
Copied from [place] [id number]. Topic: General - Other >> Jun 07, 2018  4:10 PM Ivar Drape wrote: Reason for CRM:   Patient would like to change provider from Dr. Scarlette Calico to Caesar Chestnut because she will feel more comfortable with a female provider.

## 2018-06-07 NOTE — Telephone Encounter (Signed)
Okay with me 

## 2018-06-07 NOTE — Telephone Encounter (Signed)
Please advise 

## 2018-06-07 NOTE — Telephone Encounter (Signed)
Yes, ok with me 

## 2018-06-08 NOTE — Telephone Encounter (Signed)
Patient scheduled.

## 2018-06-10 DIAGNOSIS — M9905 Segmental and somatic dysfunction of pelvic region: Secondary | ICD-10-CM | POA: Diagnosis not present

## 2018-06-10 DIAGNOSIS — M9902 Segmental and somatic dysfunction of thoracic region: Secondary | ICD-10-CM | POA: Diagnosis not present

## 2018-06-10 DIAGNOSIS — M5137 Other intervertebral disc degeneration, lumbosacral region: Secondary | ICD-10-CM | POA: Diagnosis not present

## 2018-06-10 DIAGNOSIS — S338XXA Sprain of other parts of lumbar spine and pelvis, initial encounter: Secondary | ICD-10-CM | POA: Diagnosis not present

## 2018-06-10 DIAGNOSIS — M9903 Segmental and somatic dysfunction of lumbar region: Secondary | ICD-10-CM | POA: Diagnosis not present

## 2018-06-10 DIAGNOSIS — M9904 Segmental and somatic dysfunction of sacral region: Secondary | ICD-10-CM | POA: Diagnosis not present

## 2018-06-10 DIAGNOSIS — S29012A Strain of muscle and tendon of back wall of thorax, initial encounter: Secondary | ICD-10-CM | POA: Diagnosis not present

## 2018-06-10 DIAGNOSIS — S39012A Strain of muscle, fascia and tendon of lower back, initial encounter: Secondary | ICD-10-CM | POA: Diagnosis not present

## 2018-06-28 DIAGNOSIS — S338XXA Sprain of other parts of lumbar spine and pelvis, initial encounter: Secondary | ICD-10-CM | POA: Diagnosis not present

## 2018-06-28 DIAGNOSIS — M9904 Segmental and somatic dysfunction of sacral region: Secondary | ICD-10-CM | POA: Diagnosis not present

## 2018-06-28 DIAGNOSIS — M9902 Segmental and somatic dysfunction of thoracic region: Secondary | ICD-10-CM | POA: Diagnosis not present

## 2018-06-28 DIAGNOSIS — M5137 Other intervertebral disc degeneration, lumbosacral region: Secondary | ICD-10-CM | POA: Diagnosis not present

## 2018-06-28 DIAGNOSIS — S29012A Strain of muscle and tendon of back wall of thorax, initial encounter: Secondary | ICD-10-CM | POA: Diagnosis not present

## 2018-06-28 DIAGNOSIS — S39012A Strain of muscle, fascia and tendon of lower back, initial encounter: Secondary | ICD-10-CM | POA: Diagnosis not present

## 2018-06-28 DIAGNOSIS — M9903 Segmental and somatic dysfunction of lumbar region: Secondary | ICD-10-CM | POA: Diagnosis not present

## 2018-06-28 DIAGNOSIS — M9905 Segmental and somatic dysfunction of pelvic region: Secondary | ICD-10-CM | POA: Diagnosis not present

## 2018-06-30 ENCOUNTER — Encounter: Payer: Self-pay | Admitting: Nurse Practitioner

## 2018-06-30 ENCOUNTER — Ambulatory Visit (INDEPENDENT_AMBULATORY_CARE_PROVIDER_SITE_OTHER): Payer: PPO | Admitting: Nurse Practitioner

## 2018-06-30 VITALS — BP 110/66 | HR 75 | Ht 63.0 in | Wt 124.0 lb

## 2018-06-30 DIAGNOSIS — N393 Stress incontinence (female) (male): Secondary | ICD-10-CM | POA: Diagnosis not present

## 2018-06-30 DIAGNOSIS — M8580 Other specified disorders of bone density and structure, unspecified site: Secondary | ICD-10-CM | POA: Diagnosis not present

## 2018-06-30 DIAGNOSIS — Z0000 Encounter for general adult medical examination without abnormal findings: Secondary | ICD-10-CM | POA: Diagnosis not present

## 2018-06-30 DIAGNOSIS — Z13220 Encounter for screening for lipoid disorders: Secondary | ICD-10-CM | POA: Diagnosis not present

## 2018-06-30 DIAGNOSIS — Z23 Encounter for immunization: Secondary | ICD-10-CM

## 2018-06-30 NOTE — Assessment & Plan Note (Signed)
Vitamin D today, Plan to update DEXA as well - VITAMIN D 25 Hydroxy (Vit-D Deficiency, Fractures); Future

## 2018-06-30 NOTE — Progress Notes (Signed)
[name] is a 67 y.o. female with the following history as recorded in EpicCare:  Patient Active Problem List   Diagnosis Date Noted  . Closed fracture of metatarsal shaft, right, initial encounter 03/15/2018  . TMJ pain dysfunction syndrome 07/20/2017  . Routine general medical examination at a health care facility 06/18/2017  . Abdominal pain, RUQ 06/18/2017  . Osteopenia 12/13/2015  . Nonspecific abnormal electrocardiogram (ECG) (EKG) 09/27/2013    Current Outpatient Medications  Medication Sig Dispense Refill  . aspirin 81 MG tablet Take 81 mg by mouth daily.    Marland Kitchen BIOTIN PO Take by mouth.    . Calcium Carbonate-Vitamin D (CALTRATE 600+D PO) Take by mouth daily.     . Estradiol (YUVAFEM) 10 MCG TABS vaginal tablet Take 1 tablet by mouth once a week.    . Misc Natural Products (GLUCOSAMINE CHOND COMPLEX/MSM) TABS Take 1 each by mouth 2 (two) times daily.    . Multiple Vitamin (MULTIVITAMIN) capsule Take 1 capsule by mouth daily.    . Omega-3 Fatty Acids (FISH OIL) 1000 MG CAPS Take 2 each by mouth daily.      No current facility-administered medications for this visit.     Allergies: Patient has no known allergies.  Past Medical History:  Diagnosis Date  . GERD (gastroesophageal reflux disease)     Past Surgical History:  Procedure Laterality Date  . ABDOMINAL HYSTERECTOMY    . APPENDECTOMY  2006  . disc ectomy    . pheumothorax     . PLEURAL SCARIFICATION    . TONSILECTOMY/ADENOIDECTOMY WITH MYRINGOTOMY      Family History  Problem Relation Age of Onset  . Cancer Father        myo dysplasia  . Hyperlipidemia Brother   . Hypertension Brother   . Mental illness Brother   . Cancer Maternal Grandmother   . Cancer Paternal Grandmother   . Cancer Paternal Grandfather   . Heart disease Mother   . Cancer Mother        breast cancer    Social History   Tobacco Use  . Smoking status: Former Smoker    Last attempt to quit: 05/23/1996    Years since quitting:  22.1  . Smokeless tobacco: Never Used  Substance Use Topics  . Alcohol use: No    Alcohol/week: 0.0 standard drinks     Subjective:  [name] is here today to establish care as a transfer from another provider in our practice due to wanting a female provider. Aside from primary care, she is routinely followed by GYN for womens care, s/p hysterectomy, maintained on yuvafem which is her only daily medication.  No LMP recorded. Patient is postmenopausal. Last dental exam: biannual dental exam Last vision exam: annual eye exam PAP: s/p hysterectomy, followed by GYN lung ca screening: n/a, quit smoking 24 years ago Colonoscopy: up to date Mammogram: up to date, by GYN Lipids: lipid panel today Vaccinations: flu vacc today, pna vacc today Diet and exercise: working on increasing exercise, cycling, walking; does eat a healthy diet   Review of Systems  Constitutional: Negative for chills, fever and weight loss.  HENT: Negative for hearing loss and sore throat.   Eyes: Negative for blurred vision and double vision.  Respiratory: Negative for cough and shortness of breath.   Cardiovascular: Negative for chest pain.  Gastrointestinal: Negative for constipation, diarrhea and heartburn.  Genitourinary: Negative for dysuria and hematuria.  Musculoskeletal: Negative for falls.  Skin: Negative for rash.  Neurological: Negative for speech change and headaches.  Endo/Heme/Allergies: Does not bruise/bleed easily.  Psychiatric/Behavioral: Negative for depression. The patient is not nervous/anxious.     Positive for Urinary incontinence- notices when laughinng, coughing, sneezing, small amount of urine leaking as well as having to urinate more at night than she once did Was told to do kegels for this in the past but did not really try them  Objective:  Vitals:   06/30/18 1354  BP: 110/66  Pulse: 75  SpO2: 97%  Weight: 124 lb (56.2 kg)  Height: 5\' 3"  (1.6 m)    Body mass index is 21.97  kg/m.  General: Well developed, well nourished, in no acute distress  Skin : Warm and dry.  Head: Normocephalic and atraumatic  Eyes: Sclera and conjunctiva clear; pupils round and reactive to light; extraocular movements intact  Ears: External normal; canals clear; tympanic membranes normal  Oropharynx: Pink, supple. No suspicious lesions  Neck: Supple without thyromegaly, adenopathy  Lungs: Respirations unlabored; clear to auscultation bilaterally without wheeze, rales, rhonchi  CVS exam: normal rate, regular rhythm, normal S1, S2, no murmurs, rubs, clicks or gallops.  Abdomen: Soft; nontender; nondistended; normoactive bowel sounds; no masses or hepatosplenomegaly  Musculoskeletal: No deformities; no active joint inflammation  Extremities: No edema, cyanosis Vessels: Symmetric bilaterally  Neurologic: Alert and oriented; speech intact; face symmetrical; moves all extremities well; CNII-XII intact without focal deficit    Assessment:  1. Routine general medical examination at a health care facility   2. Need for influenza vaccination   3. Screening for cholesterol level   4. Stress incontinence   5. Osteopenia, unspecified location   6. Need for pneumococcal vaccination     Plan:   Return in about 1 year (around 07/01/2019) for CPE.  Orders Placed This Encounter  Procedures  . Flu vaccine HIGH DOSE PF  . Pneumococcal conjugate vaccine 13-valent IM  . CBC    Standing Status:   Future    Standing Expiration Date:   07/01/2019  . Comprehensive metabolic panel    Standing Status:   Future    Standing Expiration Date:   07/01/2019  . Lipid panel    Standing Status:   Future    Standing Expiration Date:   07/01/2019  . VITAMIN D 25 Hydroxy (Vit-D Deficiency, Fractures)    Standing Status:   Future    Standing Expiration Date:   07/01/2019    Requested Prescriptions    No prescriptions requested or ordered in this encounter

## 2018-06-30 NOTE — Assessment & Plan Note (Signed)
Reviewed annual screening exams, healthy lifestyle, additional information provided on AVS Need for influenza vaccination- Flu vaccine HIGH DOSE PF - CBC; Future - Comprehensive metabolic panel; Future - Lipid panel; Future-Screening for cholesterol level She is not fasting, will RTC when fasting

## 2018-06-30 NOTE — Patient Instructions (Addendum)
Returns for labs downstairs when fasting   Kegel Exercises Kegel exercises help strengthen the muscles that support the rectum, vagina, small intestine, bladder, and uterus. Doing Kegel exercises can help:  Improve bladder and bowel control.  Improve sexual response.  Reduce problems and discomfort during pregnancy.  Kegel exercises involve squeezing your pelvic floor muscles, which are the same muscles you squeeze when you try to stop the flow of urine. The exercises can be done while sitting, standing, or lying down, but it is best to vary your position. Phase 1 exercises 1. Squeeze your pelvic floor muscles tight. You should feel a tight lift in your rectal area. If you are a female, you should also feel a tightness in your vaginal area. Keep your stomach, buttocks, and legs relaxed. 2. Hold the muscles tight for up to 10 seconds. 3. Relax your muscles. Repeat this exercise 50 times a day or as many times as told by your health care provider. Continue to do this exercise for at least 4-6 weeks or for as long as told by your health care provider. This information is not intended to replace advice given to you by your health care provider. Make sure you discuss any questions you have with your health care provider. Document Released: 08/25/2012 Document Revised: 05/03/2016 Document Reviewed: 07/29/2015 Elsevier Interactive Patient Education  [employer].

## 2018-06-30 NOTE — Assessment & Plan Note (Signed)
Discussed options to treat including kegels, medications She does not want to start medication at this time  She will follow back up with GYN as well for additional treatment optoins kegels education provided on AVS F/U for new, worsening symptoms

## 2018-07-01 ENCOUNTER — Other Ambulatory Visit (INDEPENDENT_AMBULATORY_CARE_PROVIDER_SITE_OTHER): Payer: PPO

## 2018-07-01 ENCOUNTER — Telehealth: Payer: Self-pay | Admitting: Nurse Practitioner

## 2018-07-01 ENCOUNTER — Other Ambulatory Visit: Payer: Self-pay | Admitting: Nurse Practitioner

## 2018-07-01 DIAGNOSIS — Z13220 Encounter for screening for lipoid disorders: Secondary | ICD-10-CM

## 2018-07-01 DIAGNOSIS — Z0000 Encounter for general adult medical examination without abnormal findings: Secondary | ICD-10-CM

## 2018-07-01 DIAGNOSIS — M8580 Other specified disorders of bone density and structure, unspecified site: Secondary | ICD-10-CM | POA: Diagnosis not present

## 2018-07-01 DIAGNOSIS — Z13820 Encounter for screening for osteoporosis: Secondary | ICD-10-CM

## 2018-07-01 DIAGNOSIS — M85851 Other specified disorders of bone density and structure, right thigh: Secondary | ICD-10-CM

## 2018-07-01 LAB — COMPREHENSIVE METABOLIC PANEL
ALK PHOS: 67 U/L (ref 39–117)
ALT: 15 U/L (ref 0–35)
AST: 17 U/L (ref 0–37)
Albumin: 4.2 g/dL (ref 3.5–5.2)
BILIRUBIN TOTAL: 0.6 mg/dL (ref 0.2–1.2)
BUN: 17 mg/dL (ref 6–23)
CO2: 28 mEq/L (ref 19–32)
CREATININE: 0.73 mg/dL (ref 0.40–1.20)
Calcium: 9.8 mg/dL (ref 8.4–10.5)
Chloride: 102 mEq/L (ref 96–112)
GFR: 84.96 mL/min (ref >60.00)
Glucose, Bld: 87 mg/dL (ref 70–99)
Potassium: 4 mEq/L (ref 3.5–5.1)
SODIUM: 138 meq/L (ref 135–145)
TOTAL PROTEIN: 7.4 g/dL (ref 6.0–8.3)

## 2018-07-01 LAB — CBC
HCT: 38.3 % (ref 36.0–46.0)
Hemoglobin: 12.8 g/dL (ref 12.0–15.0)
MCHC: 33.4 g/dL (ref 30.0–36.0)
MCV: 82.9 fl (ref 78.0–100.0)
Platelets: 305 10*3/uL (ref 150.0–400.0)
RBC: 4.62 Mil/uL (ref 3.87–5.11)
RDW: 13.8 % (ref 11.5–15.5)
WBC: 7.2 10*3/uL (ref 4.0–10.5)

## 2018-07-01 LAB — LIPID PANEL
Cholesterol: 208 mg/dL — ABNORMAL HIGH (ref 0–200)
HDL: 59.5 mg/dL (ref >39.00)
LDL Cholesterol: 129 mg/dL — ABNORMAL HIGH (ref 0–99)
NONHDL: 148.29
Total CHOL/HDL Ratio: 3
Triglycerides: 96 mg/dL (ref 0.0–149.0)
VLDL: 19.2 mg/dL (ref 0.0–40.0)

## 2018-07-01 LAB — VITAMIN D 25 HYDROXY (VIT D DEFICIENCY, FRACTURES): VITD: 48.52 ng/mL (ref 30.00–100.00)

## 2018-07-01 NOTE — Telephone Encounter (Signed)
Pt given lab results per notes of a shambley on 07/01/18. Pt verbalized understanding.  Pt will call back for appointment for bone scan Unable to document in result note. Results not routed to result note

## 2018-07-07 DIAGNOSIS — D2262 Melanocytic nevi of left upper limb, including shoulder: Secondary | ICD-10-CM | POA: Diagnosis not present

## 2018-07-07 DIAGNOSIS — D220 Melanocytic nevi of lip: Secondary | ICD-10-CM | POA: Diagnosis not present

## 2018-07-07 DIAGNOSIS — L819 Disorder of pigmentation, unspecified: Secondary | ICD-10-CM | POA: Diagnosis not present

## 2018-07-07 DIAGNOSIS — D2261 Melanocytic nevi of right upper limb, including shoulder: Secondary | ICD-10-CM | POA: Diagnosis not present

## 2018-07-07 DIAGNOSIS — L821 Other seborrheic keratosis: Secondary | ICD-10-CM | POA: Diagnosis not present

## 2018-07-07 DIAGNOSIS — L812 Freckles: Secondary | ICD-10-CM | POA: Diagnosis not present

## 2018-07-07 DIAGNOSIS — L918 Other hypertrophic disorders of the skin: Secondary | ICD-10-CM | POA: Diagnosis not present

## 2018-07-07 DIAGNOSIS — D22121 Melanocytic nevi of left upper eyelid, including canthus: Secondary | ICD-10-CM | POA: Diagnosis not present

## 2018-07-07 DIAGNOSIS — D225 Melanocytic nevi of trunk: Secondary | ICD-10-CM | POA: Diagnosis not present

## 2018-07-07 DIAGNOSIS — D2272 Melanocytic nevi of left lower limb, including hip: Secondary | ICD-10-CM | POA: Diagnosis not present

## 2018-07-07 DIAGNOSIS — L814 Other melanin hyperpigmentation: Secondary | ICD-10-CM | POA: Diagnosis not present

## 2018-07-07 DIAGNOSIS — D2271 Melanocytic nevi of right lower limb, including hip: Secondary | ICD-10-CM | POA: Diagnosis not present

## 2018-08-03 DIAGNOSIS — M9905 Segmental and somatic dysfunction of pelvic region: Secondary | ICD-10-CM | POA: Diagnosis not present

## 2018-08-03 DIAGNOSIS — M9902 Segmental and somatic dysfunction of thoracic region: Secondary | ICD-10-CM | POA: Diagnosis not present

## 2018-08-03 DIAGNOSIS — M9903 Segmental and somatic dysfunction of lumbar region: Secondary | ICD-10-CM | POA: Diagnosis not present

## 2018-08-03 DIAGNOSIS — M9904 Segmental and somatic dysfunction of sacral region: Secondary | ICD-10-CM | POA: Diagnosis not present

## 2018-08-03 DIAGNOSIS — S29012A Strain of muscle and tendon of back wall of thorax, initial encounter: Secondary | ICD-10-CM | POA: Diagnosis not present

## 2018-08-03 DIAGNOSIS — S338XXA Sprain of other parts of lumbar spine and pelvis, initial encounter: Secondary | ICD-10-CM | POA: Diagnosis not present

## 2018-08-03 DIAGNOSIS — S39012A Strain of muscle, fascia and tendon of lower back, initial encounter: Secondary | ICD-10-CM | POA: Diagnosis not present

## 2018-08-03 DIAGNOSIS — M5137 Other intervertebral disc degeneration, lumbosacral region: Secondary | ICD-10-CM | POA: Diagnosis not present

## 2018-09-12 IMAGING — DX DG ABDOMEN ACUTE W/ 1V CHEST
3 series · 3 of 3 positions shown · non-contrast
Comparison: Chest x-ray February 05, 2017

CLINICAL DATA: Upper quadrant heaviness for the past 8 months.
Former smoker.

EXAM:
DG ABDOMEN ACUTE W/ 1V CHEST

[chest pa]
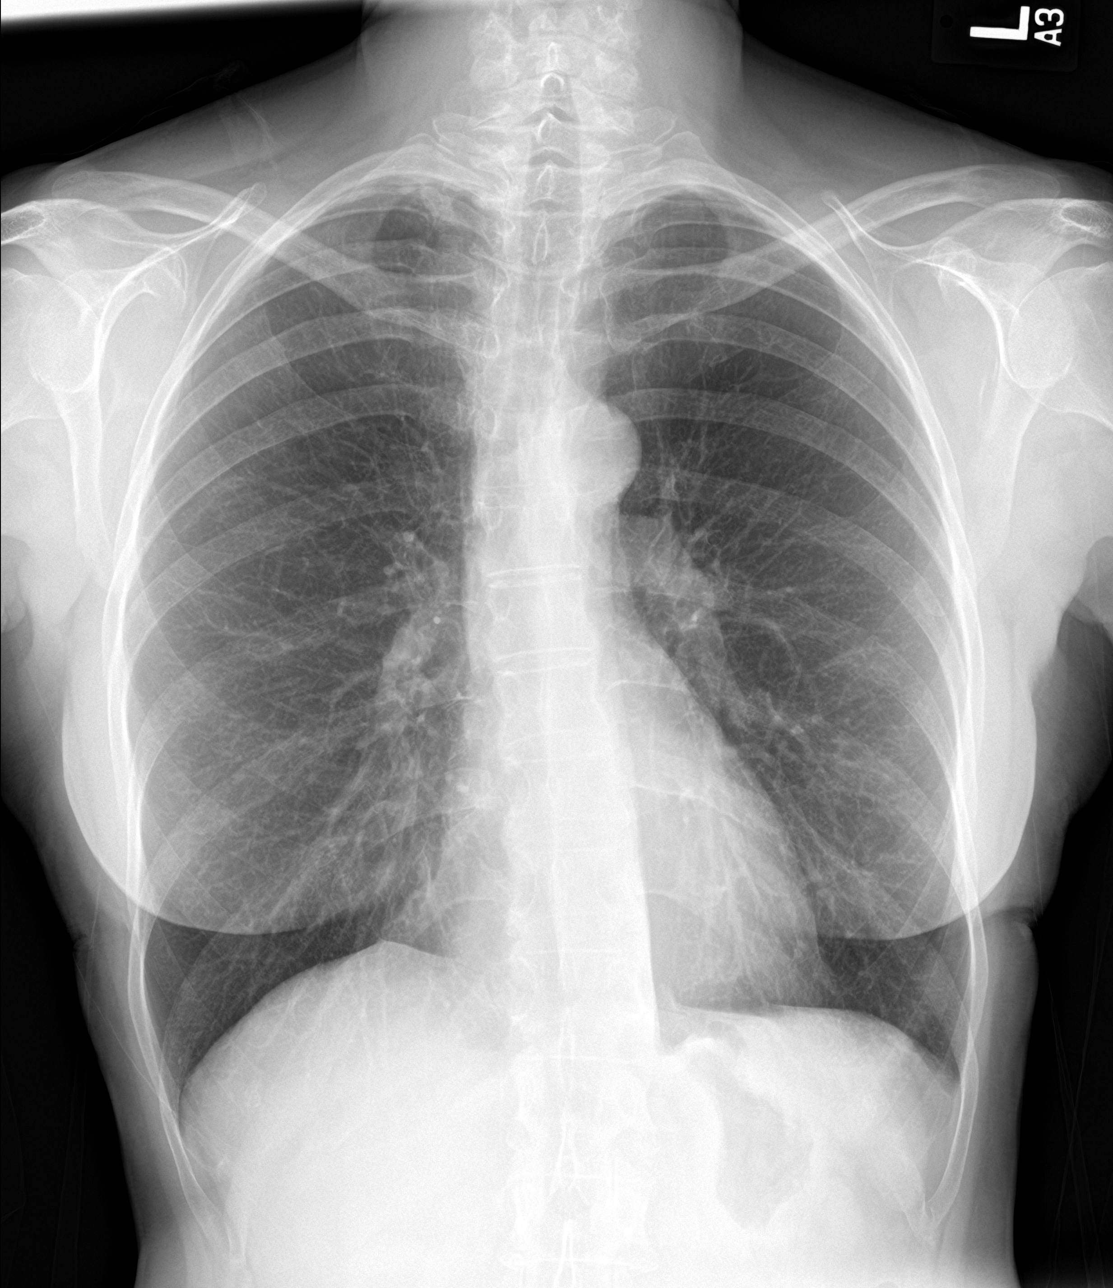

[abdomen erect]
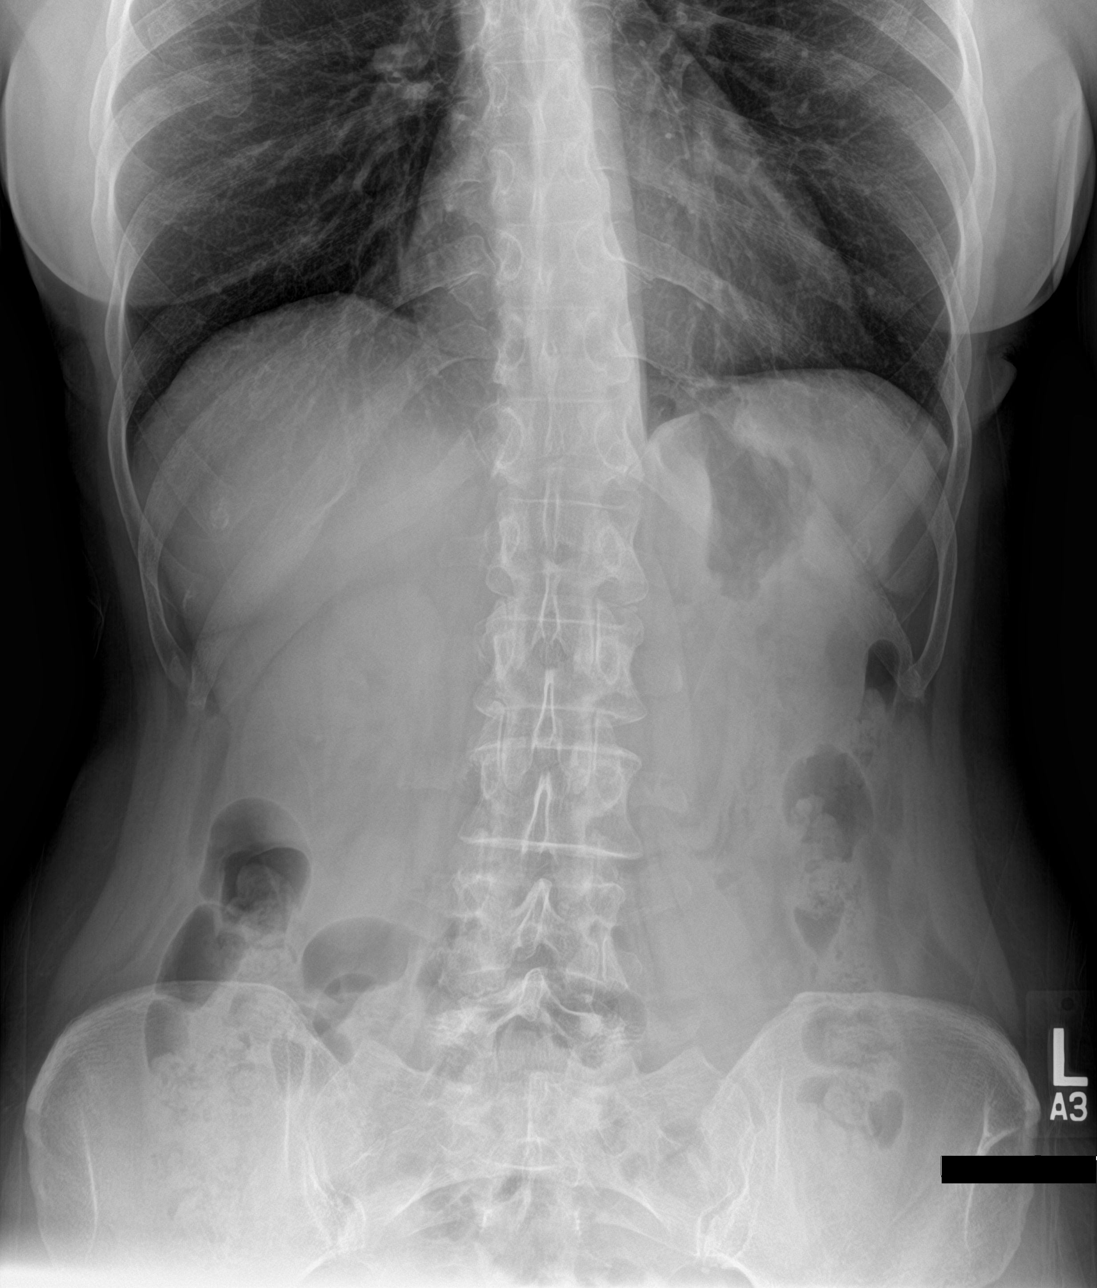

[abdomen supine]
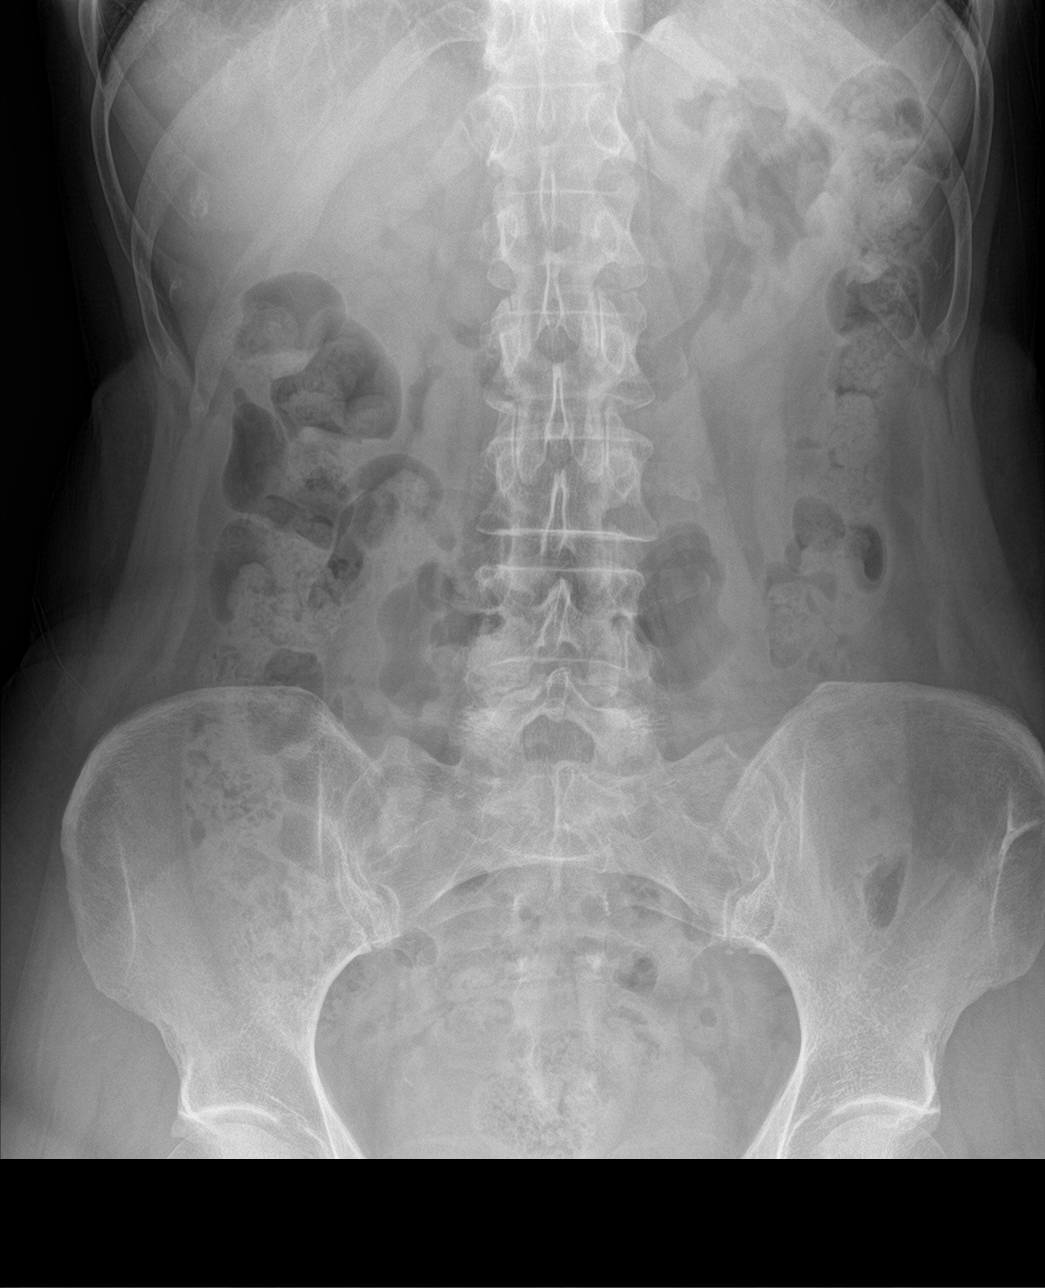

[3 of 3 positions shown; findings below may reference images not displayed]

FINDINGS: The lungs remain hyperinflated. There is no focal infiltrate. There
is no pleural effusion. The heart and pulmonary vascularity are
normal. The mediastinum is normal in width. Within the abdomen the
bowel gas pattern is normal. There are no abnormal soft tissue
calcifications. There is gentle S shaped thoracolumbar scoliosis
which is stable.
IMPRESSION: There is no active cardiopulmonary disease. Stable chronic
bronchitic -smoking related changes.

## 2018-09-14 ENCOUNTER — Other Ambulatory Visit: Payer: Self-pay

## 2018-09-14 ENCOUNTER — Emergency Department
Admission: EM | Admit: 2018-09-14 | Discharge: 2018-09-14 | Disposition: A | Discharge status: Home / Self Care | Payer: PPO | Source: Home / Self Care | Attending: Family Medicine | Admitting: Family Medicine

## 2018-09-14 DIAGNOSIS — R6889 Other general symptoms and signs: Secondary | ICD-10-CM | POA: Diagnosis not present

## 2018-09-14 LAB — POCT INFLUENZA A/B
Influenza A, POC: NEGATIVE
Influenza B, POC: NEGATIVE

## 2018-09-14 MED ORDER — ONDANSETRON 4 MG PO TBDP: 4.0000 mg | ORAL_TABLET | Freq: Three times a day (TID) | ORAL | 0 refills | Status: DC | PRN

## 2018-09-14 NOTE — ED Triage Notes (Signed)
Started around 1 am today with generalized body aches, nausea, vomited x1, dull headache

## 2018-09-14 NOTE — Discharge Instructions (Signed)
  You may take 500mg acetaminophen every 4-6 hours or in combination with ibuprofen 400-600mg every 6-8 hours as needed for pain, inflammation, and fever.  Be sure to well hydrated with clear liquids and get at least 8 hours of sleep at night, preferably more while sick.   Please follow up with family medicine in 1 week if needed.   

## 2018-09-14 NOTE — ED Provider Notes (Signed)
[facility]    CSN: [id number] Arrival date & time: 09/14/18  0816     History   Chief Complaint Chief Complaint  Patient presents with  . Generalized Body Aches  . Emesis    HPI KRISTON MCKINNIE is a 67 y.o. female.   HPI  TOY SAMARIN is a 67 y.o. female presenting to UC with c/o sudden onset body aches, nausea, and 1 episode of vomiting since 1AM this morning. Mild dull headache. No medication taken PTA. She did get the flu vaccine. Denies known exposure to the flu.    Past Medical History:  Diagnosis Date  . GERD (gastroesophageal reflux disease)     Patient Active Problem List   Diagnosis Date Noted  . Stress incontinence 06/30/2018  . Closed fracture of metatarsal shaft, right, initial encounter 03/15/2018  . TMJ pain dysfunction syndrome 07/20/2017  . Routine general medical examination at a health care facility 06/18/2017  . Abdominal pain, RUQ 06/18/2017  . Osteopenia 12/13/2015  . Nonspecific abnormal electrocardiogram (ECG) (EKG) 09/27/2013    Past Surgical History:  Procedure Laterality Date  . ABDOMINAL HYSTERECTOMY    . APPENDECTOMY  2006  . disc ectomy    . pheumothorax     . PLEURAL SCARIFICATION    . TONSILECTOMY/ADENOIDECTOMY WITH MYRINGOTOMY      OB History   No obstetric history on file.      Home Medications    Prior to Admission medications   Medication Sig Start Date End Date Taking? Authorizing Provider  aspirin 81 MG tablet Take 81 mg by mouth daily.    [provider]  BIOTIN PO Take by mouth.    [provider]  Calcium Carbonate-Vitamin D (CALTRATE 600+D PO) Take by mouth daily.     [provider]  Estradiol (YUVAFEM) 10 MCG TABS vaginal tablet Take 1 tablet by mouth once a week.    [provider]  Misc Natural Products (GLUCOSAMINE CHOND COMPLEX/MSM) TABS Take 1 each by mouth 2 (two) times daily. 03/22/18   [provider]  Multiple Vitamin (MULTIVITAMIN) capsule  Take 1 capsule by mouth daily.    [provider]  Omega-3 Fatty Acids (FISH OIL) 1000 MG CAPS Take 2 each by mouth daily.     [provider]  ondansetron (ZOFRAN ODT) 4 MG disintegrating tablet Take 1 tablet (4 mg total) by mouth every 8 (eight) hours as needed. 09/14/18   Noe Gens, PA-C    Family History Family History  Problem Relation Age of Onset  . Cancer Father        myo dysplasia  . Hyperlipidemia Brother   . Hypertension Brother   . Mental illness Brother   . Cancer Maternal Grandmother   . Cancer Paternal Grandmother   . Cancer Paternal Grandfather   . Heart disease Mother   . Cancer Mother        breast cancer    Social History Social History   Tobacco Use  . Smoking status: Former Smoker    Last attempt to quit: 05/23/1996    Years since quitting: 22.3  . Smokeless tobacco: Never Used  Substance Use Topics  . Alcohol use: No    Alcohol/week: 0.0 standard drinks  . Drug use: No     Allergies   Patient has no known allergies.   Review of Systems Review of Systems  Constitutional: Positive for fever. Negative for chills.  HENT: Negative for congestion, ear pain, sore throat,  trouble swallowing and voice change.   Respiratory: Negative for cough and shortness of breath.   Cardiovascular: Negative for chest pain and palpitations.  Gastrointestinal: Positive for nausea and vomiting. Negative for abdominal pain and diarrhea.  Musculoskeletal: Positive for arthralgias, back pain and myalgias.  Skin: Negative for rash.  Neurological: Positive for headaches. Negative for dizziness and light-headedness.     Physical Exam Triage Vital Signs ED Triage Vitals  Enc Vitals Group     BP 09/14/18 0840 124/72     Pulse Rate 09/14/18 0840 96     Resp 09/14/18 0840 18     Temp 09/14/18 0840 98.3 F (36.8 C)     Temp Source 09/14/18 0840 Oral     SpO2 09/14/18 0840 99 %     Weight 09/14/18 0844 125 lb (56.7 kg)     Height 09/14/18 0844 5'  3" (1.6 m)     Head Circumference --      Peak Flow --      Pain Score 09/14/18 0841 0     Pain Loc --      Pain Edu? --      Excl. in [place]? --    No data found.  Updated Vital Signs BP 124/72 (BP Location: Right Arm)   Pulse 96   Temp 98.3 F (36.8 C) (Oral)   Resp 18   Ht 5\' 3"  (1.6 m)   Wt 125 lb (56.7 kg)   SpO2 99%   BMI 22.14 kg/m   Visual Acuity Right Eye Distance:   Left Eye Distance:   Bilateral Distance:    Right Eye Near:   Left Eye Near:    Bilateral Near:     Physical Exam Vitals signs and nursing note reviewed.  Constitutional:      Appearance: Normal appearance. She is well-developed.  HENT:     Head: Normocephalic and atraumatic.     Right Ear: Tympanic membrane normal.     Left Ear: Tympanic membrane normal.     Nose: Nose normal.     Mouth/Throat:     Lips: Pink.     Mouth: Mucous membranes are moist.     Pharynx: Oropharynx is clear. Uvula midline. No posterior oropharyngeal erythema.  Neck:     Musculoskeletal: Normal range of motion.  Cardiovascular:     Rate and Rhythm: Normal rate and regular rhythm.  Pulmonary:     Effort: Pulmonary effort is normal. No respiratory distress.     Breath sounds: Normal breath sounds. No stridor. No wheezing, rhonchi or rales.  Abdominal:     General: There is no distension.     Palpations: Abdomen is soft.     Tenderness: There is no abdominal tenderness.  Musculoskeletal: Normal range of motion.  Skin:    General: Skin is warm and dry.  Neurological:     Mental Status: She is alert and oriented to person, place, and time.  Psychiatric:        Behavior: Behavior normal.      UC Treatments / Results  Labs (all labs ordered are listed, but only abnormal results are displayed) Labs Reviewed  POCT INFLUENZA A/B    EKG None  Radiology No results found.  Procedures Procedures (including critical care time)  Medications Ordered in UC Medications - No data to display  Initial Impression  / Assessment and Plan / UC Course  I have reviewed the triage vital signs and the nursing notes.  Pertinent labs & imaging results  that were available during my care of the patient were reviewed by me and considered in my medical decision making (see chart for details).     Rapid flu: NEGATIVE Hx and exam c/w viral illness encouraged symptomatic tx  Final Clinical Impressions(s) / UC Diagnoses   Final diagnoses:  Flu-like symptoms     Discharge Instructions      You may take 500mg  acetaminophen every 4-6 hours or in combination with ibuprofen 400-600mg  every 6-8 hours as needed for pain, inflammation, and fever.  Be sure to well hydrated with clear liquids and get at least 8 hours of sleep at night, preferably more while sick.   Please follow up with family medicine in 1 week if needed.     ED Prescriptions    Medication Sig Dispense Auth. Provider   ondansetron (ZOFRAN ODT) 4 MG disintegrating tablet Take 1 tablet (4 mg total) by mouth every 8 (eight) hours as needed. 8 tablet Noe Gens, PA-C     Controlled Substance Prescriptions [place] Controlled Substance Registry consulted? Not Applicable   Tyrell Antonio 09/14/18 1102

## 2018-09-16 ENCOUNTER — Ambulatory Visit (INDEPENDENT_AMBULATORY_CARE_PROVIDER_SITE_OTHER)
Admission: RE | Admit: 2018-09-16 | Discharge: 2018-09-16 | Disposition: A | Discharge status: Home / Self Care | Payer: PPO | Source: Ambulatory Visit | Attending: Nurse Practitioner | Admitting: Nurse Practitioner

## 2018-09-16 DIAGNOSIS — Z13820 Encounter for screening for osteoporosis: Secondary | ICD-10-CM | POA: Diagnosis not present

## 2018-09-16 DIAGNOSIS — M8580 Other specified disorders of bone density and structure, unspecified site: Secondary | ICD-10-CM

## 2018-09-27 ENCOUNTER — Telehealth: Payer: Self-pay | Admitting: Nurse Practitioner

## 2018-09-27 NOTE — Telephone Encounter (Signed)
error 

## 2018-10-06 ENCOUNTER — Telehealth: Payer: Self-pay | Admitting: Nurse Practitioner

## 2018-10-06 NOTE — Telephone Encounter (Signed)
Sending to medical records.

## 2018-10-06 NOTE — Telephone Encounter (Signed)
Copied from [place] [id number]. Topic: Quick Communication - See Telephone Encounter >> Oct 06, 2018  8:47 AM Ivar Drape wrote: CRM for notification. See Telephone encounter for: 10/06/18. Patient would like a copy on disk of her recent Bone Density. She needs to take it with her to her specialist appt.  Please call when ready to pick up.

## 2018-10-12 DIAGNOSIS — M810 Age-related osteoporosis without current pathological fracture: Secondary | ICD-10-CM | POA: Diagnosis not present

## 2018-10-15 ENCOUNTER — Telehealth: Payer: Self-pay | Admitting: Nurse Practitioner

## 2018-10-15 NOTE — Telephone Encounter (Deleted)
Pt wants it noted that she does not want to do the prolia injection for her recent dx of osteoporosis. Pt states she has decided to go to a specialist, Dr Elgie Congo.

## 2018-10-15 NOTE — Telephone Encounter (Signed)
Copied from [place] [id number]. Topic: General - Inquiry >> Oct 15, 2018  8:48 AM Scherrie Gerlach wrote: Reason for CRM:  Pt wants it noted that she does not want to do the prolia injection for her recent dx of osteoporosis. Pt states she has decided to go to a specialist, Dr Elgie Congo.

## 2018-10-15 NOTE — Telephone Encounter (Signed)
Noted  

## 2018-12-03 DIAGNOSIS — M810 Age-related osteoporosis without current pathological fracture: Secondary | ICD-10-CM | POA: Diagnosis not present

## 2018-12-03 DIAGNOSIS — R5383 Other fatigue: Secondary | ICD-10-CM | POA: Diagnosis not present

## 2018-12-17 ENCOUNTER — Encounter (HOSPITAL_COMMUNITY): Payer: PPO

## 2019-02-11 DIAGNOSIS — S29012A Strain of muscle and tendon of back wall of thorax, initial encounter: Secondary | ICD-10-CM | POA: Diagnosis not present

## 2019-02-11 DIAGNOSIS — M47812 Spondylosis without myelopathy or radiculopathy, cervical region: Secondary | ICD-10-CM | POA: Diagnosis not present

## 2019-02-11 DIAGNOSIS — M9902 Segmental and somatic dysfunction of thoracic region: Secondary | ICD-10-CM | POA: Diagnosis not present

## 2019-02-11 DIAGNOSIS — M9901 Segmental and somatic dysfunction of cervical region: Secondary | ICD-10-CM | POA: Diagnosis not present

## 2019-02-28 DIAGNOSIS — M47812 Spondylosis without myelopathy or radiculopathy, cervical region: Secondary | ICD-10-CM | POA: Diagnosis not present

## 2019-02-28 DIAGNOSIS — M9901 Segmental and somatic dysfunction of cervical region: Secondary | ICD-10-CM | POA: Diagnosis not present

## 2019-02-28 DIAGNOSIS — S29012A Strain of muscle and tendon of back wall of thorax, initial encounter: Secondary | ICD-10-CM | POA: Diagnosis not present

## 2019-02-28 DIAGNOSIS — M9902 Segmental and somatic dysfunction of thoracic region: Secondary | ICD-10-CM | POA: Diagnosis not present

## 2019-04-15 DIAGNOSIS — Z01419 Encounter for gynecological examination (general) (routine) without abnormal findings: Secondary | ICD-10-CM | POA: Diagnosis not present

## 2019-04-15 DIAGNOSIS — Z1231 Encounter for screening mammogram for malignant neoplasm of breast: Secondary | ICD-10-CM | POA: Diagnosis not present

## 2019-04-28 DIAGNOSIS — H524 Presbyopia: Secondary | ICD-10-CM | POA: Diagnosis not present

## 2019-04-28 DIAGNOSIS — H0015 Chalazion left lower eyelid: Secondary | ICD-10-CM | POA: Diagnosis not present

## 2019-04-28 DIAGNOSIS — H52223 Regular astigmatism, bilateral: Secondary | ICD-10-CM | POA: Diagnosis not present

## 2019-04-28 DIAGNOSIS — H5203 Hypermetropia, bilateral: Secondary | ICD-10-CM | POA: Diagnosis not present

## 2019-05-03 ENCOUNTER — Other Ambulatory Visit: Payer: PPO

## 2019-05-03 ENCOUNTER — Ambulatory Visit (INDEPENDENT_AMBULATORY_CARE_PROVIDER_SITE_OTHER): Payer: PPO | Admitting: Family

## 2019-05-03 ENCOUNTER — Other Ambulatory Visit: Payer: Self-pay

## 2019-05-03 ENCOUNTER — Encounter: Payer: Self-pay | Admitting: Family

## 2019-05-03 VITALS — BP 116/78 | HR 80 | Temp 97.5°F | Ht 63.0 in | Wt 121.0 lb

## 2019-05-03 DIAGNOSIS — R102 Pelvic and perineal pain: Secondary | ICD-10-CM | POA: Diagnosis not present

## 2019-05-03 DIAGNOSIS — R3915 Urgency of urination: Secondary | ICD-10-CM

## 2019-05-03 LAB — POC URINALSYSI DIPSTICK (AUTOMATED)
Bilirubin, UA: NEGATIVE
Blood, UA: NEGATIVE
Glucose, UA: NEGATIVE
Ketones, UA: NEGATIVE
Leukocytes, UA: NEGATIVE
Nitrite, UA: NEGATIVE
Protein, UA: NEGATIVE
Spec Grav, UA: <=1.005 — AB (ref 1.010–1.025)
Urobilinogen, UA: 0.2 E.U./dL
pH, UA: 7 (ref 5.0–8.0)

## 2019-05-03 NOTE — Progress Notes (Signed)
[name] is a 68 y.o. female with the following history as recorded in EpicCare:  Patient Active Problem List   Diagnosis Date Noted  . Stress incontinence 06/30/2018  . Closed fracture of metatarsal shaft, right, initial encounter 03/15/2018  . TMJ pain dysfunction syndrome 07/20/2017  . Routine general medical examination at a health care facility 06/18/2017  . Abdominal pain, RUQ 06/18/2017  . Other specified disorders of bone density and structure, right thigh 12/13/2015  . Nonspecific abnormal electrocardiogram (ECG) (EKG) 09/27/2013    Current Outpatient Medications  Medication Sig Dispense Refill  . aspirin 81 MG tablet Take 81 mg by mouth daily.    Marland Kitchen BIOTIN PO Take by mouth.    . Calcium Carbonate-Vitamin D (CALTRATE 600+D PO) Take by mouth daily.     . Misc Natural Products (GLUCOSAMINE CHOND COMPLEX/MSM) TABS Take 1 each by mouth 2 (two) times daily.    . Multiple Vitamin (MULTIVITAMIN) capsule Take 1 capsule by mouth daily.    . Omega-3 Fatty Acids (FISH OIL) 1000 MG CAPS Take 2 each by mouth daily.     . Estradiol (YUVAFEM) 10 MCG TABS vaginal tablet Take 1 tablet by mouth once a week.    . ondansetron (ZOFRAN ODT) 4 MG disintegrating tablet Take 1 tablet (4 mg total) by mouth every 8 (eight) hours as needed. (Patient not taking: Reported on 05/03/2019) 8 tablet 0   No current facility-administered medications for this visit.     Allergies: Patient has no known allergies.  Past Medical History:  Diagnosis Date  . GERD (gastroesophageal reflux disease)     Past Surgical History:  Procedure Laterality Date  . ABDOMINAL HYSTERECTOMY    . APPENDECTOMY  2006  . disc ectomy    . pheumothorax     . PLEURAL SCARIFICATION    . TONSILECTOMY/ADENOIDECTOMY WITH MYRINGOTOMY      Family History  Problem Relation Age of Onset  . Cancer Father        myo dysplasia  . Hyperlipidemia Brother   . Hypertension Brother   . Mental illness Brother   . Cancer Maternal  Grandmother   . Cancer Paternal Grandmother   . Cancer Paternal Grandfather   . Heart disease Mother   . Cancer Mother        breast cancer    Social History   Tobacco Use  . Smoking status: Former Smoker    Quit date: 05/23/1996    Years since quitting: 22.9  . Smokeless tobacco: Never Used  Substance Use Topics  . Alcohol use: No    Alcohol/week: 0.0 standard drinks    Subjective:  Patient presents with concerns for bladder pressure/ pelvic pain x 1 week; + occasional pelvic pain; no burning with urination; did have pelvic exam 2 weeks ago with GYN and was normal; describes as "constant need to urinate." Worse with sitting; no blood in the urine; history of kidney stones " years ago."  Hysterectomy was done in 2018- was originally taking vaginal estrogen; has stopped within the past year;    Objective:  Vitals:   05/03/19 1013  BP: 116/78  Pulse: 80  Temp: (!) 97.5 F (36.4 C)  TempSrc: Oral  SpO2: 99%  Weight: 121 lb (54.9 kg)  Height: 5\' 3"  (1.6 m)    General: Well developed, well nourished, in no acute distress  Skin : Warm and dry.  Head: Normocephalic and atraumatic  Lungs: Respirations unlabored; clear to auscultation bilaterally without wheeze, rales, rhonchi  Abdomen: Soft; nontender; nondistended; normoactive bowel sounds; no masses or hepatosplenomegaly  Musculoskeletal: No deformities; no active joint inflammation; negative CVA tenderness Neurologic: Alert and oriented; speech intact; face symmetrical; moves all extremities well; CNII-XII intact without focal deficit   Assessment:  1. Urinary urgency   2. Pelvic pressure in female     Plan:  ? Estrogen deficiency; U/A is not remarkable- update urine culture; recommend to re-start her topical estrogen; follow-up to be determined-  May need to consider imaging.   No follow-ups on file.  Orders Placed This Encounter  Procedures  . Urine Culture    Standing Status:   Future    Standing Expiration Date:    05/02/2020    Requested Prescriptions    No prescriptions requested or ordered in this encounter

## 2019-05-03 NOTE — Addendum Note (Signed)
Addended by: Marcina Millard on: 05/03/2019 12:57 PM   Modules accepted: Orders

## 2019-05-05 LAB — URINE CULTURE
MICRO NUMBER:: 758670
Result:: NO GROWTH
SPECIMEN QUALITY:: ADEQUATE

## 2019-05-17 ENCOUNTER — Encounter: Payer: Self-pay | Admitting: Family

## 2019-05-17 ENCOUNTER — Other Ambulatory Visit: Payer: Self-pay | Admitting: Family

## 2019-05-17 DIAGNOSIS — R3915 Urgency of urination: Secondary | ICD-10-CM

## 2019-06-17 DIAGNOSIS — M205X2 Other deformities of toe(s) (acquired), left foot: Secondary | ICD-10-CM | POA: Diagnosis not present

## 2019-06-17 DIAGNOSIS — M21962 Unspecified acquired deformity of left lower leg: Secondary | ICD-10-CM | POA: Diagnosis not present

## 2019-06-17 DIAGNOSIS — M79672 Pain in left foot: Secondary | ICD-10-CM | POA: Diagnosis not present

## 2019-06-17 DIAGNOSIS — M779 Enthesopathy, unspecified: Secondary | ICD-10-CM | POA: Diagnosis not present

## 2019-08-12 DIAGNOSIS — R350 Frequency of micturition: Secondary | ICD-10-CM | POA: Diagnosis not present

## 2019-08-12 DIAGNOSIS — R3915 Urgency of urination: Secondary | ICD-10-CM | POA: Diagnosis not present

## 2019-08-22 DIAGNOSIS — M205X2 Other deformities of toe(s) (acquired), left foot: Secondary | ICD-10-CM | POA: Diagnosis not present

## 2019-08-24 NOTE — Progress Notes (Addendum)
Per patient she is not having surgery at [facility]. Patient stated she will be having surgery this Friday Dec. 3rd at surgery center on [address] Called Dr. Deitra Mayo surgery scheduler Caryl Pina and left message for clarification. Awaiting return call.

## 2019-08-26 DIAGNOSIS — M25572 Pain in left ankle and joints of left foot: Secondary | ICD-10-CM | POA: Diagnosis not present

## 2019-08-26 DIAGNOSIS — M2012 Hallux valgus (acquired), left foot: Secondary | ICD-10-CM | POA: Diagnosis not present

## 2019-08-26 DIAGNOSIS — M205X2 Other deformities of toe(s) (acquired), left foot: Secondary | ICD-10-CM | POA: Diagnosis not present

## 2019-08-26 DIAGNOSIS — M898X7 Other specified disorders of bone, ankle and foot: Secondary | ICD-10-CM | POA: Diagnosis not present

## 2019-08-29 ENCOUNTER — Ambulatory Visit (HOSPITAL_BASED_OUTPATIENT_CLINIC_OR_DEPARTMENT_OTHER): Admission: RE | Admit: 2019-08-29 | Payer: PPO | Source: Home / Self Care | Admitting: Podiatry

## 2019-08-29 ENCOUNTER — Encounter (HOSPITAL_BASED_OUTPATIENT_CLINIC_OR_DEPARTMENT_OTHER): Admission: RE | Payer: Self-pay | Source: Home / Self Care

## 2019-08-29 SURGERY — CHILECTOMY
Anesthesia: General | Laterality: Left

## 2019-08-30 DIAGNOSIS — M205X2 Other deformities of toe(s) (acquired), left foot: Secondary | ICD-10-CM | POA: Diagnosis not present

## 2019-09-19 DIAGNOSIS — M205X2 Other deformities of toe(s) (acquired), left foot: Secondary | ICD-10-CM | POA: Diagnosis not present

## 2019-09-27 DIAGNOSIS — L570 Actinic keratosis: Secondary | ICD-10-CM | POA: Diagnosis not present

## 2019-09-27 DIAGNOSIS — L814 Other melanin hyperpigmentation: Secondary | ICD-10-CM | POA: Diagnosis not present

## 2019-09-27 DIAGNOSIS — L821 Other seborrheic keratosis: Secondary | ICD-10-CM | POA: Diagnosis not present

## 2019-11-09 ENCOUNTER — Other Ambulatory Visit: Payer: Self-pay

## 2019-11-09 ENCOUNTER — Ambulatory Visit: Payer: PPO | Admitting: Orthopaedic Surgery

## 2019-11-09 ENCOUNTER — Ambulatory Visit (INDEPENDENT_AMBULATORY_CARE_PROVIDER_SITE_OTHER): Payer: PPO

## 2019-11-09 ENCOUNTER — Encounter: Payer: Self-pay | Admitting: Orthopaedic Surgery

## 2019-11-09 VITALS — BP 116/78 | HR 71 | Ht 63.0 in | Wt 122.0 lb

## 2019-11-09 DIAGNOSIS — M65312 Trigger thumb, left thumb: Secondary | ICD-10-CM | POA: Diagnosis not present

## 2019-11-09 DIAGNOSIS — M79645 Pain in left finger(s): Secondary | ICD-10-CM

## 2019-11-09 MED ORDER — METHYLPREDNISOLONE ACETATE 40 MG/ML IJ SUSP
13.3300 mg | INTRAMUSCULAR | Status: AC | PRN
  Administered 2019-11-09: 10:00:00 13.33 mg

## 2019-11-09 MED ORDER — LIDOCAINE HCL 1 % IJ SOLN
0.3000 mL | INTRAMUSCULAR | Status: AC | PRN
  Administered 2019-11-09: 10:00:00 .3 mL

## 2019-11-09 MED ORDER — BUPIVACAINE HCL 0.25 % IJ SOLN
0.3300 mL | INTRAMUSCULAR | Status: AC | PRN
  Administered 2019-11-09: 10:00:00 .33 mL

## 2019-11-09 NOTE — Progress Notes (Signed)
Office Visit Note   Patient: [name]           Date of Birth: [birth year]           MRN: [id number] Visit Date: 11/09/2019              Requested by: Marrian Salvage, [place],  [place] [id number] PCP: Marrian Salvage, FNP   Assessment & Plan: Visit Diagnoses:  1. Pain of left thumb   2. Trigger thumb, left thumb     Plan: Trigger thumb injection performed.  Dorsal splint distal joint placed in usage discussed she can use it off and on.  If she has persistent triggering she will let us know.  Follow-Up Instructions: No follow-ups on file.   Orders:  Orders Placed This Encounter  Procedures  . Hand/UE Inj: L thumb A1  . XR Finger Thumb Left   No orders of the defined types were placed in this encounter.     Procedures: Hand/UE Inj: L thumb A1 for trigger finger on 11/09/2019 10:03 AM Medications: 0.3 mL lidocaine 1 %; 0.33 mL bupivacaine 0.25 %; 13.33 mg methylPREDNISolone acetate 40 MG/ML      Clinical Data: No additional findings.   Subjective: Chief Complaint  Patient presents with  . Left Thumb - Pain    HPI [name] seen with left thumb pain and catching.  She is already had previous right trigger thumb release and another digit was released in the distant past.  Left thumb is been stuck when she wakes up in the morning she has to manually reduce it which is painful .  She been working out using some hand weights and thinks this may have aggravated her symptoms with her left thumb triggering.  Review of Systems previous trigger finger and right trigger thumb surgery in the past.  Dorsal ganglion of the wrist left wrist surgery.  History of metatarsal shaft fractures otherwise negative as pertains HPI.   Objective: Vital Signs: BP 116/78   Pulse 71   Ht 5\' 3"  (1.6 m)   Wt 122 lb (55.3 kg)   BMI 21.61 kg/m   Physical Exam Constitutional:      Appearance: She is well-developed.  HENT:     Head: Normocephalic.   Right Ear: External ear normal.     Left Ear: External ear normal.  Eyes:     Pupils: Pupils are equal, round, and reactive to light.  Neck:     Thyroid: No thyromegaly.     Trachea: No tracheal deviation.  Cardiovascular:     Rate and Rhythm: Normal rate.  Pulmonary:     Effort: Pulmonary effort is normal.  Abdominal:     Palpations: Abdomen is soft.  Skin:    General: Skin is warm and dry.  Neurological:     Mental Status: She is alert and oriented to person, place, and time.  Psychiatric:        Behavior: Behavior normal.     Ortho Exam patient has pain tenderness over left thumb A1 pulley.  Negative Finkelstein test.  Good capillary refill.  No thenar atrophy.  Specialty Comments:  No specialty comments available.  Imaging: XR Finger Thumb Left  Result Date: 11/09/2019 Three-view x-rays left thumb obtained and reviewed.  This shows normal bony architecture no degenerative changes normal sesamoid. Impression: Normal left thumb x-rays.    PMFS History: Patient Active Problem List   Diagnosis Date Noted  . Trigger thumb, left  thumb 11/09/2019  . Stress incontinence 06/30/2018  . Closed fracture of metatarsal shaft, right, initial encounter 03/15/2018  . TMJ pain dysfunction syndrome 07/20/2017  . Routine general medical examination at a health care facility 06/18/2017  . Abdominal pain, RUQ 06/18/2017  . Other specified disorders of bone density and structure, right thigh 12/13/2015  . Nonspecific abnormal electrocardiogram (ECG) (EKG) 09/27/2013   Past Medical History:  Diagnosis Date  . GERD (gastroesophageal reflux disease)     Family History  Problem Relation Age of Onset  . Cancer Father        myo dysplasia  . Hyperlipidemia Brother   . Hypertension Brother   . Mental illness Brother   . Cancer Maternal Grandmother   . Cancer Paternal Grandmother   . Cancer Paternal Grandfather   . Heart disease Mother   . Cancer Mother        breast cancer      Past Surgical History:  Procedure Laterality Date  . ABDOMINAL HYSTERECTOMY    . APPENDECTOMY  2006  . disc ectomy    . pheumothorax     . PLEURAL SCARIFICATION    . TONSILECTOMY/ADENOIDECTOMY WITH MYRINGOTOMY     Social History   Occupational History  . Not on file  Tobacco Use  . Smoking status: Former Smoker    Quit date: 05/23/1996    Years since quitting: 23.4  . Smokeless tobacco: Never Used  Substance and Sexual Activity  . Alcohol use: No    Alcohol/week: 0.0 standard drinks  . Drug use: No  . Sexual activity: Yes

## 2019-11-22 DIAGNOSIS — L249 Irritant contact dermatitis, unspecified cause: Secondary | ICD-10-CM | POA: Diagnosis not present

## 2019-12-13 DIAGNOSIS — M6281 Muscle weakness (generalized): Secondary | ICD-10-CM | POA: Diagnosis not present

## 2019-12-13 DIAGNOSIS — R3915 Urgency of urination: Secondary | ICD-10-CM | POA: Diagnosis not present

## 2019-12-13 DIAGNOSIS — M62838 Other muscle spasm: Secondary | ICD-10-CM | POA: Diagnosis not present

## 2019-12-13 DIAGNOSIS — M6289 Other specified disorders of muscle: Secondary | ICD-10-CM | POA: Diagnosis not present

## 2019-12-13 DIAGNOSIS — R350 Frequency of micturition: Secondary | ICD-10-CM | POA: Diagnosis not present

## 2019-12-20 DIAGNOSIS — L718 Other rosacea: Secondary | ICD-10-CM | POA: Diagnosis not present

## 2020-01-12 ENCOUNTER — Telehealth: Payer: Self-pay | Admitting: Orthopaedic Surgery

## 2020-01-12 NOTE — Telephone Encounter (Signed)
Please advise. Left trigger thumb injection 10/2019.

## 2020-01-12 NOTE — Telephone Encounter (Signed)
Did blue sheet, will be faxed to you after lunch for left trigger thumb release. thanks

## 2020-01-12 NOTE — Telephone Encounter (Signed)
Patient called.   The area in which she received her last injection is still not doing any better. She is requesting a call back to discuss her next course of action.   Call back: [phone]

## 2020-01-17 ENCOUNTER — Encounter: Payer: Self-pay | Admitting: Orthopaedic Surgery

## 2020-01-18 DIAGNOSIS — D2261 Melanocytic nevi of right upper limb, including shoulder: Secondary | ICD-10-CM | POA: Diagnosis not present

## 2020-01-18 DIAGNOSIS — L814 Other melanin hyperpigmentation: Secondary | ICD-10-CM | POA: Diagnosis not present

## 2020-01-18 DIAGNOSIS — L821 Other seborrheic keratosis: Secondary | ICD-10-CM | POA: Diagnosis not present

## 2020-01-18 DIAGNOSIS — D1801 Hemangioma of skin and subcutaneous tissue: Secondary | ICD-10-CM | POA: Diagnosis not present

## 2020-01-18 DIAGNOSIS — D2272 Melanocytic nevi of left lower limb, including hip: Secondary | ICD-10-CM | POA: Diagnosis not present

## 2020-01-18 DIAGNOSIS — L718 Other rosacea: Secondary | ICD-10-CM | POA: Diagnosis not present

## 2020-01-18 DIAGNOSIS — D2271 Melanocytic nevi of right lower limb, including hip: Secondary | ICD-10-CM | POA: Diagnosis not present

## 2020-01-18 DIAGNOSIS — L918 Other hypertrophic disorders of the skin: Secondary | ICD-10-CM | POA: Diagnosis not present

## 2020-01-18 DIAGNOSIS — D225 Melanocytic nevi of trunk: Secondary | ICD-10-CM | POA: Diagnosis not present

## 2020-01-18 DIAGNOSIS — D224 Melanocytic nevi of scalp and neck: Secondary | ICD-10-CM | POA: Diagnosis not present

## 2020-01-18 DIAGNOSIS — D2262 Melanocytic nevi of left upper limb, including shoulder: Secondary | ICD-10-CM | POA: Diagnosis not present

## 2020-01-23 DIAGNOSIS — M65312 Trigger thumb, left thumb: Secondary | ICD-10-CM | POA: Diagnosis not present

## 2020-01-31 ENCOUNTER — Ambulatory Visit (INDEPENDENT_AMBULATORY_CARE_PROVIDER_SITE_OTHER): Payer: PPO | Admitting: Orthopaedic Surgery

## 2020-01-31 ENCOUNTER — Other Ambulatory Visit: Payer: Self-pay

## 2020-01-31 ENCOUNTER — Encounter: Payer: Self-pay | Admitting: Orthopaedic Surgery

## 2020-01-31 VITALS — Ht 63.0 in | Wt 122.0 lb

## 2020-01-31 DIAGNOSIS — M65312 Trigger thumb, left thumb: Secondary | ICD-10-CM

## 2020-01-31 NOTE — Progress Notes (Signed)
Post trigger thumb release.  No further triggering sutures are harvested today Steri-Strips applied return as needed she is happy with the surgical result.

## 2020-03-21 ENCOUNTER — Other Ambulatory Visit: Payer: Self-pay

## 2020-03-21 ENCOUNTER — Ambulatory Visit (INDEPENDENT_AMBULATORY_CARE_PROVIDER_SITE_OTHER): Payer: PPO | Admitting: Family

## 2020-03-21 ENCOUNTER — Encounter: Payer: Self-pay | Admitting: Family

## 2020-03-21 VITALS — BP 102/68 | HR 70 | Temp 98.7°F | Wt 124.6 lb

## 2020-03-21 DIAGNOSIS — Z1231 Encounter for screening mammogram for malignant neoplasm of breast: Secondary | ICD-10-CM | POA: Diagnosis not present

## 2020-03-21 DIAGNOSIS — Z0000 Encounter for general adult medical examination without abnormal findings: Secondary | ICD-10-CM | POA: Diagnosis not present

## 2020-03-21 DIAGNOSIS — M810 Age-related osteoporosis without current pathological fracture: Secondary | ICD-10-CM | POA: Diagnosis not present

## 2020-03-21 DIAGNOSIS — Z13220 Encounter for screening for lipoid disorders: Secondary | ICD-10-CM

## 2020-03-21 DIAGNOSIS — Z23 Encounter for immunization: Secondary | ICD-10-CM

## 2020-03-21 NOTE — Progress Notes (Signed)
[name] is a 70 y.o. female with the following history as recorded in Epic[employer]:  Patient Active Problem List   Diagnosis Date Noted  . Trigger thumb, left thumb 11/09/2019  . Stress incontinence 06/30/2018  . Closed fracture of metatarsal shaft, right, initial encounter 03/15/2018  . TMJ pain dysfunction syndrome 07/20/2017  . Routine general medical examination at a health care facility 06/18/2017  . Abdominal pain, RUQ 06/18/2017  . Other specified disorders of bone density and structure, right thigh 12/13/2015  . Nonspecific abnormal electrocardiogram (ECG) (EKG) 09/27/2013    Current Outpatient Medications  Medication Sig Dispense Refill  . aspirin 81 MG tablet Take 81 mg by mouth daily.    Marland Kitchen BIOTIN PO Take by mouth.    . Calcium Carbonate-Vitamin D (CALTRATE 600+D PO) Take by mouth daily.     Marland Kitchen doxycycline (ADOXA) 50 MG tablet Take 50 mg by mouth 2 (two) times daily.    . metroNIDAZOLE (METROCREAM) 0.75 % cream Apply 1 application topically 2 (two) times daily.    . Misc Natural Products (GLUCOSAMINE CHOND COMPLEX/MSM) TABS Take 1 each by mouth 2 (two) times daily.    . Multiple Vitamin (MULTIVITAMIN) capsule Take 1 capsule by mouth daily.    . Omega-3 Fatty Acids (FISH OIL) 1000 MG CAPS Take 2 each by mouth daily.     . ondansetron (ZOFRAN ODT) 4 MG disintegrating tablet Take 1 tablet (4 mg total) by mouth every 8 (eight) hours as needed. (Patient not taking: Reported on 05/03/2019) 8 tablet 0   No current facility-administered medications for this visit.    Allergies: Patient has no known allergies.  Past Medical History:  Diagnosis Date  . GERD (gastroesophageal reflux disease)     Past Surgical History:  Procedure Laterality Date  . ABDOMINAL HYSTERECTOMY    . APPENDECTOMY  2006  . disc ectomy    . pheumothorax     . PLEURAL SCARIFICATION    . TONSILECTOMY/ADENOIDECTOMY WITH MYRINGOTOMY      Family History  Problem Relation Age of Onset  . Cancer Father         myo dysplasia  . Hyperlipidemia Brother   . Hypertension Brother   . Mental illness Brother   . Cancer Maternal Grandmother   . Cancer Paternal Grandmother   . Cancer Paternal Grandfather   . Heart disease Mother   . Cancer Mother        breast cancer    Social History   Tobacco Use  . Smoking status: Former Smoker    Quit date: 05/23/1996    Years since quitting: 23.8  . Smokeless tobacco: Never Used  Substance Use Topics  . Alcohol use: No    Alcohol/week: 0.0 standard drinks    Subjective:  Presents for yearly CPE; has been seeing GYN regularly; up to date on dental and vision exams;  Would like to update preventive healthcare questions;  Review of Systems  Constitutional: Negative.   HENT: Negative.   Eyes: Negative.   Respiratory: Negative.   Cardiovascular: Negative.   Gastrointestinal: Negative.   Genitourinary: Negative.   Musculoskeletal: Negative.   Skin: Negative.   Neurological: Negative.   Endo/Heme/Allergies: Negative.   Psychiatric/Behavioral: Negative.      Objective:  Vitals:   03/21/20 1235  BP: 102/68  Pulse: 70  Temp: 98.7 F (37.1 C)  TempSrc: Oral  SpO2: 96%  Weight: 124 lb 9.6 oz (56.5 kg)    General: Well developed, well nourished, in no acute  distress  Skin : Warm and dry.  Head: Normocephalic and atraumatic  Eyes: Sclera and conjunctiva clear; pupils round and reactive to light; extraocular movements intact  Ears: External normal; canals clear; tympanic membranes normal  Oropharynx: Pink, supple. No suspicious lesions  Neck: Supple without thyromegaly, adenopathy  Lungs: Respirations unlabored; clear to auscultation bilaterally without wheeze, rales, rhonchi  CVS exam: normal rate and regular rhythm.  Abdomen: Soft; nontender; nondistended; normoactive bowel sounds; no masses or hepatosplenomegaly  Musculoskeletal: No deformities; no active joint inflammation  Extremities: No edema, cyanosis, clubbing  Vessels: Symmetric  bilaterally  Neurologic: Alert and oriented; speech intact; face symmetrical; moves all extremities well; CNII-XII intact without focal deficit   Assessment:  1. PE (physical exam), annual   2. Osteoporosis, unspecified osteoporosis type, unspecified pathological fracture presence   3. Screening mammogram, encounter for   4. Lipid screening     Plan:  Age appropriate preventive healthcare needs addressed; encouraged regular eye doctor and dental exams; encouraged regular exercise; will update labs and refills as needed today; follow-up to be determined; Will update DEXA and then discuss treatment options; patients treatment hesitancy is understandable and will re-evaluate based on updated imaging. She will get fasting labs done at [facility] at later date; Pneumovax 23 given today; she will get her Shingrix at her pharmacy at her convenience.   No follow-ups on file.  Orders Placed This Encounter  Procedures  . DG Bone Density    Standing Status:   Future    Standing Expiration Date:   03/21/2021    Order Specific Question:   Reason for Exam (SYMPTOM  OR DIAGNOSIS REQUIRED)    Answer:   osteoporosis    Order Specific Question:   Preferred imaging location?    Answer:   [facility]  . MM Digital Screening    Standing Status:   Future    Standing Expiration Date:   03/21/2021    Order Specific Question:   Reason for Exam (SYMPTOM  OR DIAGNOSIS REQUIRED)    Answer:   screening mammogram    Order Specific Question:   Preferred imaging location?    Answer:   [facility]  . Pneumococcal polysaccharide vaccine 23-valent greater than or equal to 2yo subcutaneous/IM  . CBC with Differential/Platelet    Standing Status:   Future    Standing Expiration Date:   03/21/2021  . Comp Met (CMET)    Standing Status:   Future    Standing Expiration Date:   03/21/2021  . Lipid panel    Standing Status:   Future    Standing Expiration Date:   03/21/2021  . TSH    Standing Status:   Future     Standing Expiration Date:   03/21/2021  . Vitamin D (25 hydroxy)    Standing Status:   Future    Standing Expiration Date:   03/21/2021    Requested Prescriptions    No prescriptions requested or ordered in this encounter

## 2020-03-28 ENCOUNTER — Telehealth: Payer: Self-pay

## 2020-03-28 NOTE — Telephone Encounter (Signed)
LVM for patient to call back and schedule bone density scan.

## 2020-03-29 ENCOUNTER — Other Ambulatory Visit (INDEPENDENT_AMBULATORY_CARE_PROVIDER_SITE_OTHER): Payer: PPO

## 2020-03-29 DIAGNOSIS — Z0000 Encounter for general adult medical examination without abnormal findings: Secondary | ICD-10-CM

## 2020-03-29 DIAGNOSIS — Z13220 Encounter for screening for lipoid disorders: Secondary | ICD-10-CM

## 2020-03-29 DIAGNOSIS — M810 Age-related osteoporosis without current pathological fracture: Secondary | ICD-10-CM

## 2020-03-29 LAB — COMPREHENSIVE METABOLIC PANEL
ALT: 16 U/L (ref 0–35)
AST: 21 U/L (ref 0–37)
Albumin: 4.4 g/dL (ref 3.5–5.2)
Alkaline Phosphatase: 85 U/L (ref 39–117)
BUN: 14 mg/dL (ref 6–23)
CO2: 29 mEq/L (ref 19–32)
Calcium: 9.4 mg/dL (ref 8.4–10.5)
Chloride: 102 mEq/L (ref 96–112)
Creatinine, Ser: 0.68 mg/dL (ref 0.40–1.20)
GFR: 86.3 mL/min (ref >60.00)
Glucose, Bld: 96 mg/dL (ref 70–99)
Potassium: 3.8 mEq/L (ref 3.5–5.1)
Sodium: 138 mEq/L (ref 135–145)
Total Bilirubin: 0.5 mg/dL (ref 0.2–1.2)
Total Protein: 7.3 g/dL (ref 6.0–8.3)

## 2020-03-29 LAB — CBC WITH DIFFERENTIAL/PLATELET
Basophils Absolute: 0 10*3/uL (ref 0.0–0.1)
Basophils Relative: 0.6 % (ref 0.0–3.0)
Eosinophils Absolute: 0.2 10*3/uL (ref 0.0–0.7)
Eosinophils Relative: 2.7 % (ref 0.0–5.0)
HCT: 38 % (ref 36.0–46.0)
Hemoglobin: 12.9 g/dL (ref 12.0–15.0)
Lymphocytes Relative: 29.3 % (ref 12.0–46.0)
Lymphs Abs: 2.2 10*3/uL (ref 0.7–4.0)
MCHC: 34 g/dL (ref 30.0–36.0)
MCV: 82.2 fl (ref 78.0–100.0)
Monocytes Absolute: 0.7 10*3/uL (ref 0.1–1.0)
Monocytes Relative: 9.1 % (ref 3.0–12.0)
Neutro Abs: 4.4 10*3/uL (ref 1.4–7.7)
Neutrophils Relative %: 58.3 % (ref 43.0–77.0)
Platelets: 347 10*3/uL (ref 150.0–400.0)
RBC: 4.63 Mil/uL (ref 3.87–5.11)
RDW: 14 % (ref 11.5–15.5)
WBC: 7.5 10*3/uL (ref 4.0–10.5)

## 2020-03-29 LAB — VITAMIN D 25 HYDROXY (VIT D DEFICIENCY, FRACTURES): VITD: 44.11 ng/mL (ref 30.00–100.00)

## 2020-03-29 LAB — LIPID PANEL
Cholesterol: 213 mg/dL — ABNORMAL HIGH (ref 0–200)
HDL: 60.6 mg/dL (ref >39.00)
LDL Cholesterol: 133 mg/dL — ABNORMAL HIGH (ref 0–99)
NonHDL: 152.67
Total CHOL/HDL Ratio: 4
Triglycerides: 99 mg/dL (ref 0.0–149.0)
VLDL: 19.8 mg/dL (ref 0.0–40.0)

## 2020-03-29 LAB — TSH: TSH: 2.49 u[IU]/mL (ref 0.35–4.50)

## 2020-03-30 ENCOUNTER — Ambulatory Visit (INDEPENDENT_AMBULATORY_CARE_PROVIDER_SITE_OTHER)
Admission: RE | Admit: 2020-03-30 | Discharge: 2020-03-30 | Disposition: A | Discharge status: Home / Self Care | Payer: PPO | Source: Ambulatory Visit | Attending: Family | Admitting: Family

## 2020-03-30 DIAGNOSIS — M810 Age-related osteoporosis without current pathological fracture: Secondary | ICD-10-CM

## 2020-04-17 ENCOUNTER — Ambulatory Visit
Admission: RE | Admit: 2020-04-17 | Discharge: 2020-04-17 | Disposition: A | Discharge status: Home / Self Care | Payer: PPO | Source: Ambulatory Visit | Attending: Family | Admitting: Family

## 2020-04-17 ENCOUNTER — Other Ambulatory Visit: Payer: Self-pay

## 2020-04-17 DIAGNOSIS — Z1231 Encounter for screening mammogram for malignant neoplasm of breast: Secondary | ICD-10-CM | POA: Diagnosis not present

## 2020-05-07 DIAGNOSIS — H2513 Age-related nuclear cataract, bilateral: Secondary | ICD-10-CM | POA: Diagnosis not present

## 2020-05-07 DIAGNOSIS — H524 Presbyopia: Secondary | ICD-10-CM | POA: Diagnosis not present

## 2020-05-07 DIAGNOSIS — H04123 Dry eye syndrome of bilateral lacrimal glands: Secondary | ICD-10-CM | POA: Diagnosis not present

## 2020-05-07 DIAGNOSIS — H52203 Unspecified astigmatism, bilateral: Secondary | ICD-10-CM | POA: Diagnosis not present

## 2020-05-16 DIAGNOSIS — M2022 Hallux rigidus, left foot: Secondary | ICD-10-CM | POA: Diagnosis not present

## 2020-05-24 DIAGNOSIS — Z20822 Contact with and (suspected) exposure to covid-19: Secondary | ICD-10-CM | POA: Diagnosis not present

## 2020-06-20 DIAGNOSIS — M47812 Spondylosis without myelopathy or radiculopathy, cervical region: Secondary | ICD-10-CM | POA: Diagnosis not present

## 2020-06-20 DIAGNOSIS — M9902 Segmental and somatic dysfunction of thoracic region: Secondary | ICD-10-CM | POA: Diagnosis not present

## 2020-06-20 DIAGNOSIS — M9901 Segmental and somatic dysfunction of cervical region: Secondary | ICD-10-CM | POA: Diagnosis not present

## 2020-06-20 DIAGNOSIS — S29012A Strain of muscle and tendon of back wall of thorax, initial encounter: Secondary | ICD-10-CM | POA: Diagnosis not present

## 2020-06-22 DIAGNOSIS — M9902 Segmental and somatic dysfunction of thoracic region: Secondary | ICD-10-CM | POA: Diagnosis not present

## 2020-06-22 DIAGNOSIS — M9901 Segmental and somatic dysfunction of cervical region: Secondary | ICD-10-CM | POA: Diagnosis not present

## 2020-06-22 DIAGNOSIS — S29012A Strain of muscle and tendon of back wall of thorax, initial encounter: Secondary | ICD-10-CM | POA: Diagnosis not present

## 2020-06-22 DIAGNOSIS — M47812 Spondylosis without myelopathy or radiculopathy, cervical region: Secondary | ICD-10-CM | POA: Diagnosis not present

## 2020-07-12 DIAGNOSIS — S29012A Strain of muscle and tendon of back wall of thorax, initial encounter: Secondary | ICD-10-CM | POA: Diagnosis not present

## 2020-07-12 DIAGNOSIS — M9901 Segmental and somatic dysfunction of cervical region: Secondary | ICD-10-CM | POA: Diagnosis not present

## 2020-07-12 DIAGNOSIS — M47812 Spondylosis without myelopathy or radiculopathy, cervical region: Secondary | ICD-10-CM | POA: Diagnosis not present

## 2020-07-12 DIAGNOSIS — M9902 Segmental and somatic dysfunction of thoracic region: Secondary | ICD-10-CM | POA: Diagnosis not present

## 2020-07-23 DIAGNOSIS — M9902 Segmental and somatic dysfunction of thoracic region: Secondary | ICD-10-CM | POA: Diagnosis not present

## 2020-07-23 DIAGNOSIS — M47812 Spondylosis without myelopathy or radiculopathy, cervical region: Secondary | ICD-10-CM | POA: Diagnosis not present

## 2020-07-23 DIAGNOSIS — S29012A Strain of muscle and tendon of back wall of thorax, initial encounter: Secondary | ICD-10-CM | POA: Diagnosis not present

## 2020-07-23 DIAGNOSIS — M9901 Segmental and somatic dysfunction of cervical region: Secondary | ICD-10-CM | POA: Diagnosis not present

## 2020-08-21 DIAGNOSIS — M47812 Spondylosis without myelopathy or radiculopathy, cervical region: Secondary | ICD-10-CM | POA: Diagnosis not present

## 2020-08-21 DIAGNOSIS — M9902 Segmental and somatic dysfunction of thoracic region: Secondary | ICD-10-CM | POA: Diagnosis not present

## 2020-08-21 DIAGNOSIS — M9901 Segmental and somatic dysfunction of cervical region: Secondary | ICD-10-CM | POA: Diagnosis not present

## 2020-08-21 DIAGNOSIS — S29012A Strain of muscle and tendon of back wall of thorax, initial encounter: Secondary | ICD-10-CM | POA: Diagnosis not present

## 2020-08-24 ENCOUNTER — Encounter: Payer: Self-pay | Admitting: Family

## 2020-08-24 ENCOUNTER — Other Ambulatory Visit: Payer: Self-pay | Admitting: Family

## 2020-08-24 MED ORDER — ESTRADIOL 10 MCG VA TABS: ORAL_TABLET | VAGINAL | 1 refills | Status: DC

## 2020-09-18 DIAGNOSIS — S29012A Strain of muscle and tendon of back wall of thorax, initial encounter: Secondary | ICD-10-CM | POA: Diagnosis not present

## 2020-09-18 DIAGNOSIS — M9902 Segmental and somatic dysfunction of thoracic region: Secondary | ICD-10-CM | POA: Diagnosis not present

## 2020-09-18 DIAGNOSIS — M47812 Spondylosis without myelopathy or radiculopathy, cervical region: Secondary | ICD-10-CM | POA: Diagnosis not present

## 2020-09-18 DIAGNOSIS — M9901 Segmental and somatic dysfunction of cervical region: Secondary | ICD-10-CM | POA: Diagnosis not present

## 2020-11-09 ENCOUNTER — Telehealth: Payer: Self-pay

## 2020-11-09 NOTE — Telephone Encounter (Signed)
Pt is requesting a TOC from Dr. Valere Dross to Dr. Rogers Blocker, due to Dr. Valere Dross moving from a [place] office to [facility].  Pt.'s husband is a pt of Dr. Rogers Blocker.

## 2020-11-09 NOTE — Telephone Encounter (Signed)
That is fine.  Orma Flaming, MD [place]

## 2020-11-09 NOTE — Telephone Encounter (Signed)
Fine with me too; very nice lady;

## 2020-11-12 NOTE — Telephone Encounter (Signed)
LVM to get pt Scheduled

## 2020-12-03 ENCOUNTER — Other Ambulatory Visit: Payer: Self-pay

## 2020-12-03 ENCOUNTER — Ambulatory Visit (INDEPENDENT_AMBULATORY_CARE_PROVIDER_SITE_OTHER): Payer: PPO | Admitting: Family Medicine

## 2020-12-03 ENCOUNTER — Encounter: Payer: Self-pay | Admitting: Family Medicine

## 2020-12-03 VITALS — BP 110/58 | HR 70 | Temp 98.4°F | Ht 63.0 in | Wt 125.2 lb

## 2020-12-03 DIAGNOSIS — M810 Age-related osteoporosis without current pathological fracture: Secondary | ICD-10-CM | POA: Diagnosis not present

## 2020-12-03 DIAGNOSIS — E785 Hyperlipidemia, unspecified: Secondary | ICD-10-CM | POA: Diagnosis not present

## 2020-12-03 NOTE — Progress Notes (Signed)
Patient: [name] MRN: [id number] DOB: [birth year] PCP: Orma Flaming, MD     Subjective:  Chief Complaint  Patient presents with  . Transitions Of Care  . Osteoporosis    HPI: The patient is a 70 y.o. female who presents today for TOC.    Osteoporosis Last DEXA scan was in 03/2020. She had a tscore of -2.5. she has been reluctant to take medication. She took fosamax years ago and she had stomach problems. She was taken off of this. Still thinking of other treatments, but wants to hold off at this time.    Hyperlipidemia Mild in nature. Hx of CAD in her brother. She does not smoke, have diabetes or have HTN. Her mother died at 76 years of age.   The 10-year ASCVD risk score Mikey Bussing DC Jr., et al., 2013) is: 9.7%  Hm reviewed and UTD>   Review of Systems  Constitutional: Negative for chills, fatigue and fever.  HENT: Negative for dental problem, ear pain, hearing loss and trouble swallowing.   Eyes: Negative for visual disturbance.  Respiratory: Negative for cough, chest tightness and shortness of breath.   Cardiovascular: Negative for chest pain, palpitations and leg swelling.  Gastrointestinal: Negative for abdominal pain, blood in stool, diarrhea and nausea.  Endocrine: Negative for cold intolerance, polydipsia, polyphagia and polyuria.  Genitourinary: Negative for dysuria and hematuria.  Musculoskeletal: Negative for arthralgias.  Skin: Negative for rash.  Neurological: Negative for dizziness and headaches.  Psychiatric/Behavioral: Negative for dysphoric mood and sleep disturbance. The patient is not nervous/anxious.     Allergies Patient has No Known Allergies.  Past Medical History Patient  has a past medical history of GERD (gastroesophageal reflux disease).  Surgical History Patient  has a past surgical history that includes disc ectomy; Tonsilectomy/adenoidectomy with myringotomy; Pleural scarification; pheumothorax ; Appendectomy (2006); and Abdominal  hysterectomy.  Family History Pateint's family history includes Breast cancer in her mother; Cancer in her father, maternal grandmother, mother, paternal grandfather, and paternal grandmother; Heart disease in her mother; Hyperlipidemia in her brother; Hypertension in her brother; Mental illness in her brother.  Social History Patient  reports that she quit smoking about 24 years ago. She has never used smokeless tobacco. She reports that she does not drink alcohol and does not use drugs.    Objective: Vitals:   12/03/20 1330  BP: (!) 110/58  Pulse: 70  Temp: 98.4 F (36.9 C)  TempSrc: Temporal  SpO2: 97%  Weight: 125 lb 3.2 oz (56.8 kg)  Height: 5\' 3"  (1.6 m)    Body mass index is 22.18 kg/m.  Physical Exam Vitals reviewed.  Constitutional:      Appearance: Normal appearance. She is well-developed and normal weight.  HENT:     Head: Normocephalic and atraumatic.     Right Ear: Tympanic membrane, ear canal and external ear normal.     Left Ear: External ear normal. There is impacted cerumen.     Nose: Nose normal.     Mouth/Throat:     Mouth: Mucous membranes are moist.  Eyes:     Extraocular Movements: Extraocular movements intact.     Conjunctiva/sclera: Conjunctivae normal.     Pupils: Pupils are equal, round, and reactive to light.  Neck:     Thyroid: No thyromegaly.  Cardiovascular:     Rate and Rhythm: Normal rate and regular rhythm.     Pulses: Normal pulses.     Heart sounds: Normal heart sounds. No murmur heard.   Pulmonary:  Effort: Pulmonary effort is normal.     Breath sounds: Normal breath sounds.  Abdominal:     General: Abdomen is flat. Bowel sounds are normal. There is no distension.     Palpations: Abdomen is soft.     Tenderness: There is no abdominal tenderness.  Musculoskeletal:     Cervical back: Normal range of motion and neck supple.  Lymphadenopathy:     Cervical: No cervical adenopathy.  Skin:    General: Skin is warm and dry.      Capillary Refill: Capillary refill takes less than 2 seconds.     Findings: No rash.  Neurological:     General: No focal deficit present.     Mental Status: She is alert and oriented to person, place, and time.     Cranial Nerves: No cranial nerve deficit.     Coordination: Coordination normal.     Deep Tendon Reflexes: Reflexes normal.  Psychiatric:        Mood and Affect: Mood normal.        Behavior: Behavior normal.    [facility] Office Visit from 12/03/2020 in [facility]  PHQ-2 Total Score 0         Assessment/plan: 1. Hyperlipidemia, mild ASCVD score of 9.7%. low risk. She is working on new diet. Will see her back in 6 months with fasting labs.   2. Osteoporosis without current pathological fracture, unspecified osteoporosis type Declines medication at this time interested in possible reclast vs. Prolia after next bone scan if continues to decline in BMD. Continues to do weight bearing exercises and take calcium and vitamin D. repeat bone scan in 2-3 years.   reviewed chart/meds/problem list and HM.     This visit occurred during the SARS-CoV-2 public health emergency.  Safety protocols were in place, including screening questions prior to the visit, additional usage of staff PPE, and extensive cleaning of exam room while observing appropriate contact time as indicated for disinfecting solutions.     No follow-ups on file.   Orma Flaming, MD [place]   12/03/2020

## 2020-12-03 NOTE — Patient Instructions (Addendum)
Calcium: 1200mg /day Vitamin D3: 2000IU/day.    -keep doing exercises/weight bearing exercises. We can repeat dexa in 2-3 years.  -shingles shot get at pharmacy. Would wait at least 6 months from covid shot.   You are doing great!!  So nice to meet you!  Dr. Rogers Blocker

## 2020-12-13 DIAGNOSIS — D485 Neoplasm of uncertain behavior of skin: Secondary | ICD-10-CM | POA: Diagnosis not present

## 2020-12-13 DIAGNOSIS — A630 Anogenital (venereal) warts: Secondary | ICD-10-CM | POA: Diagnosis not present

## 2020-12-13 DIAGNOSIS — L821 Other seborrheic keratosis: Secondary | ICD-10-CM | POA: Diagnosis not present

## 2020-12-13 DIAGNOSIS — L859 Epidermal thickening, unspecified: Secondary | ICD-10-CM | POA: Diagnosis not present

## 2021-03-14 ENCOUNTER — Other Ambulatory Visit: Payer: Self-pay | Admitting: Physician Assistant

## 2021-03-14 ENCOUNTER — Other Ambulatory Visit: Payer: Self-pay

## 2021-03-14 DIAGNOSIS — Z1231 Encounter for screening mammogram for malignant neoplasm of breast: Secondary | ICD-10-CM

## 2021-04-03 DIAGNOSIS — L738 Other specified follicular disorders: Secondary | ICD-10-CM | POA: Diagnosis not present

## 2021-04-03 DIAGNOSIS — D2271 Melanocytic nevi of right lower limb, including hip: Secondary | ICD-10-CM | POA: Diagnosis not present

## 2021-04-03 DIAGNOSIS — C44619 Basal cell carcinoma of skin of left upper limb, including shoulder: Secondary | ICD-10-CM | POA: Diagnosis not present

## 2021-04-03 DIAGNOSIS — L821 Other seborrheic keratosis: Secondary | ICD-10-CM | POA: Diagnosis not present

## 2021-04-03 DIAGNOSIS — L718 Other rosacea: Secondary | ICD-10-CM | POA: Diagnosis not present

## 2021-04-03 DIAGNOSIS — D2272 Melanocytic nevi of left lower limb, including hip: Secondary | ICD-10-CM | POA: Diagnosis not present

## 2021-04-03 DIAGNOSIS — D225 Melanocytic nevi of trunk: Secondary | ICD-10-CM | POA: Diagnosis not present

## 2021-04-03 DIAGNOSIS — D1801 Hemangioma of skin and subcutaneous tissue: Secondary | ICD-10-CM | POA: Diagnosis not present

## 2021-04-03 DIAGNOSIS — D485 Neoplasm of uncertain behavior of skin: Secondary | ICD-10-CM | POA: Diagnosis not present

## 2021-04-03 DIAGNOSIS — D2239 Melanocytic nevi of other parts of face: Secondary | ICD-10-CM | POA: Diagnosis not present

## 2021-05-13 DIAGNOSIS — H52203 Unspecified astigmatism, bilateral: Secondary | ICD-10-CM | POA: Diagnosis not present

## 2021-05-13 DIAGNOSIS — H2513 Age-related nuclear cataract, bilateral: Secondary | ICD-10-CM | POA: Diagnosis not present

## 2021-05-13 DIAGNOSIS — H524 Presbyopia: Secondary | ICD-10-CM | POA: Diagnosis not present

## 2021-05-14 ENCOUNTER — Ambulatory Visit
Admission: RE | Admit: 2021-05-14 | Discharge: 2021-05-14 | Disposition: A | Discharge status: Home / Self Care | Payer: PPO | Source: Ambulatory Visit | Attending: Physician Assistant | Admitting: Physician Assistant

## 2021-05-14 ENCOUNTER — Other Ambulatory Visit: Payer: Self-pay

## 2021-05-14 DIAGNOSIS — Z1231 Encounter for screening mammogram for malignant neoplasm of breast: Secondary | ICD-10-CM | POA: Diagnosis not present

## 2021-05-17 ENCOUNTER — Other Ambulatory Visit: Payer: Self-pay | Admitting: Family

## 2021-06-04 ENCOUNTER — Encounter: Payer: Self-pay | Admitting: Physician Assistant

## 2021-06-04 ENCOUNTER — Ambulatory Visit (INDEPENDENT_AMBULATORY_CARE_PROVIDER_SITE_OTHER): Payer: PPO | Admitting: Physician Assistant

## 2021-06-04 ENCOUNTER — Other Ambulatory Visit: Payer: Self-pay

## 2021-06-04 VITALS — BP 120/68 | HR 69 | Temp 98.3°F | Ht 63.25 in | Wt 123.4 lb

## 2021-06-04 DIAGNOSIS — Z0001 Encounter for general adult medical examination with abnormal findings: Secondary | ICD-10-CM | POA: Diagnosis not present

## 2021-06-04 DIAGNOSIS — K219 Gastro-esophageal reflux disease without esophagitis: Secondary | ICD-10-CM | POA: Diagnosis not present

## 2021-06-04 DIAGNOSIS — M25551 Pain in right hip: Secondary | ICD-10-CM | POA: Diagnosis not present

## 2021-06-04 DIAGNOSIS — F40243 Fear of flying: Secondary | ICD-10-CM

## 2021-06-04 DIAGNOSIS — M810 Age-related osteoporosis without current pathological fracture: Secondary | ICD-10-CM

## 2021-06-04 DIAGNOSIS — Z23 Encounter for immunization: Secondary | ICD-10-CM | POA: Diagnosis not present

## 2021-06-04 DIAGNOSIS — E785 Hyperlipidemia, unspecified: Secondary | ICD-10-CM

## 2021-06-04 DIAGNOSIS — N393 Stress incontinence (female) (male): Secondary | ICD-10-CM | POA: Diagnosis not present

## 2021-06-04 DIAGNOSIS — N952 Postmenopausal atrophic vaginitis: Secondary | ICD-10-CM | POA: Insufficient documentation

## 2021-06-04 DIAGNOSIS — R6882 Decreased libido: Secondary | ICD-10-CM | POA: Insufficient documentation

## 2021-06-04 DIAGNOSIS — N809 Endometriosis, unspecified: Secondary | ICD-10-CM | POA: Insufficient documentation

## 2021-06-04 MED ORDER — DIAZEPAM 5 MG PO TABS: 5.0000 mg | ORAL_TABLET | ORAL | 0 refills | Status: DC | PRN

## 2021-06-04 NOTE — Patient Instructions (Addendum)
It was great to see you!  Valium sent  Referral for [facility]  For your trigger finger -- consider Lynne Leader with [facility]  Please make an appointment with the lab on your way out. I would like for you to return for lab work within 1-2 weeks. After midnight on the day of the lab draw, please do not eat anything. You may have water, black coffee, unsweetened tea.  Take care,  Aldona Bar

## 2021-06-04 NOTE — Progress Notes (Signed)
I acted as a Education administrator for [employer], PA-C [employer], LPN    Subjective:    [name] is a 70 y.o. female and is here for a comprehensive physical exam.   HPI  Health Maintenance Due  Topic Date Due   URINE MICROALBUMIN  Never done   COVID-19 Vaccine (4 - Booster for Pfizer series) 10/30/2020   INFLUENZA VACCINE  04/22/2021    Acute Concerns: R hip pain -- wakes her up at night; has a pressure point that alleviates her pain when she presses it; does not take medication for this. Sitting long periods of time makes it worse. Has been going on for several years. No car accident or falls. Denies numbness or tingling.  Chronic Issues: Osteoporosis -- does not want to take prolia due to adverse effects; cannot take bisphosphonates due eot GERD; does take regular vit D and calcium  GERD -- gets laryngitis-like symptoms and mucus in her throat when symptoms are uncontrolled.  Stress incontinence -- has seen pelvic floor PT in the past. Gets twinges of abdominal pain when doing exercises. Uses vaginal cream and this has been helpful.  Fear of flying -- takes valium 5 mg prn. Needs refill, flying to [place] soon.  Health Maintenance: Immunizations -- UTD Colonoscopy -- UTD, due 11/2027 Mammogram -- UTD PAP -- N/A Bone Density -- UTD Diet -- eats very healthy diet and salads Sleep habits -- denies significant concerns Exercise -- walks daily Weight -- Weight: 123 lb 6.1 oz (56 kg)  Mood -- stable Weight history: Wt Readings from Last 10 Encounters:  06/04/21 123 lb 6.1 oz (56 kg)  12/03/20 125 lb 3.2 oz (56.8 kg)  03/21/20 124 lb 9.6 oz (56.5 kg)  01/31/20 122 lb (55.3 kg)  11/09/19 122 lb (55.3 kg)  05/03/19 121 lb (54.9 kg)  09/14/18 125 lb (56.7 kg)  06/30/18 124 lb (56.2 kg)  03/15/18 120 lb (54.4 kg)  07/20/17 123 lb (55.8 kg)   Body mass index is 21.68 kg/m. No LMP recorded. Patient is postmenopausal. Alcohol use:  reports no history of alcohol  use. Tobacco use:  Tobacco Use: Medium Risk   Smoking Tobacco Use: Former   Smokeless Tobacco Use: Never     Depression screen PHQ 2/9 06/04/2021  Decreased Interest 0  Down, Depressed, Hopeless 0  PHQ - 2 Score 0     Other providers/specialists: Patient Care Team: Inda Coke, [place] as PCP - General (Physician Assistant) Allyn Kenner, DO as Consulting Physician (Obstetrics and Gynecology) Rolm Bookbinder, MD as Consulting Physician (Dermatology)    PMHx, SurgHx, SocialHx, Medications, and Allergies were reviewed in the Visit Navigator and updated as appropriate.   Past Medical History:  Diagnosis Date   GERD (gastroesophageal reflux disease)      Past Surgical History:  Procedure Laterality Date   ABDOMINAL HYSTERECTOMY     APPENDECTOMY  2006   disc ectomy     pheumothorax      PLEURAL SCARIFICATION     TONSILECTOMY/ADENOIDECTOMY WITH MYRINGOTOMY       Family History  Problem Relation Age of Onset   Cancer Father        myo dysplasia   Hyperlipidemia Brother    Hypertension Brother    Mental illness Brother    Cancer Maternal Grandmother    Cancer Paternal Grandmother    Cancer Paternal Grandfather    Heart disease Mother    Cancer Mother        breast cancer  Breast cancer Mother     Social History   Tobacco Use   Smoking status: Former    Types: Cigarettes    Quit date: 05/23/1996    Years since quitting: 25.0   Smokeless tobacco: Never  Substance Use Topics   Alcohol use: No    Alcohol/week: 0.0 standard drinks   Drug use: No    Review of Systems:   Review of Systems  Constitutional:  Negative for chills, fever, malaise/fatigue and weight loss.  HENT:  Negative for hearing loss, sinus pain and sore throat.   Respiratory:  Negative for cough and hemoptysis.   Cardiovascular:  Negative for chest pain, palpitations, leg swelling and PND.  Gastrointestinal:  Negative for abdominal pain, constipation, diarrhea, heartburn, nausea and  vomiting.  Genitourinary:  Negative for dysuria, frequency and urgency.  Musculoskeletal:  Negative for back pain, myalgias and neck pain.  Skin:  Negative for itching and rash.  Neurological:  Negative for dizziness, tingling, seizures and headaches.  Endo/Heme/Allergies:  Negative for polydipsia.  Psychiatric/Behavioral:  Negative for depression. The patient is not nervous/anxious.    Objective:   BP 120/68 (BP Location: Left Arm, Patient Position: Sitting, Cuff Size: Normal)   Pulse 69   Temp 98.3 F (36.8 C) (Temporal)   Ht 5' 3.25" (1.607 m)   Wt 123 lb 6.1 oz (56 kg)   SpO2 96%   BMI 21.68 kg/m  Body mass index is 21.68 kg/m.   General Appearance:    Alert, cooperative, no distress, appears stated age  Head:    Normocephalic, without obvious abnormality, atraumatic  Eyes:    PERRL, conjunctiva/corneas clear, EOM's intact, fundi    benign, both eyes  Ears:    Normal TM's and external ear canals, both ears  Nose:   Nares normal, septum midline, mucosa normal, no drainage    or sinus tenderness  Throat:   Lips, mucosa, and tongue normal; teeth and gums normal  Neck:   Supple, symmetrical, trachea midline, no adenopathy;    thyroid:  no enlargement/tenderness/nodules; no carotid   bruit or JVD  Back:     Symmetric, no curvature, ROM normal, no CVA tenderness  Lungs:     Clear to auscultation bilaterally, respirations unlabored  Chest Wall:    No tenderness or deformity   Heart:    Regular rate and rhythm, S1 and S2 normal, no murmur, rub or gallop  Breast Exam:    No tenderness, masses, or nipple abnormality  Abdomen:     Soft, non-tender, bowel sounds active all four quadrants,    no masses, no organomegaly  Genitalia:    Normal female without lesion, discharge or tenderness  Extremities:   Extremities normal, atraumatic, no cyanosis or edema  Pulses:   2+ and symmetric all extremities  Skin:   Skin color, texture, turgor normal, no rashes or lesions  Lymph nodes:    Cervical, supraclavicular, and axillary nodes normal  Neurologic:   CNII-XII intact, normal strength, sensation and reflexes    throughout    Assessment/Plan:   1. Encounter for general adult medical examination with abnormal findings Today patient counseled on age appropriate routine health concerns for screening and prevention, each reviewed and up to date or declined. Immunizations reviewed and up to date or declined. Labs ordered and reviewed. Risk factors for depression reviewed and negative. Hearing function and visual acuity are intact. ADLs screened and addressed as needed. Functional ability and level of safety reviewed and appropriate. Education, counseling and referrals  performed based on assessed risks today. Patient provided with a copy of personalized plan for preventive services.   2. Right hip pain Suspect bursitis or hip flexor strain Obtain hip xray due to chronicity of sx and hx of osteoporosis Consider sports med vs PT referral based on results  3. Osteoporosis without current pathological fracture, unspecified osteoporosis type Declines meds Would like to see GI to address GERD to possibly start fosamax  4. Hyperlipidemia, mild Update lipid panel and provide recommendations accordingly  5. Stress incontinence Declines pelvic floor PT Follow-up as needed  6. Gastroesophageal reflux disease, unspecified whether esophagitis present Referral to GI per patient request, she would like to see Dr Tarri Glenn or [facility]  7. Fear of flying Valium 5 mg #4 sent in Follow-up as needed    Patient Counseling: '[x]'$    Nutrition: Stressed importance of moderation in sodium/caffeine intake, saturated fat and cholesterol, caloric balance, sufficient intake of fresh fruits, vegetables, fiber, calcium, iron, and 1 mg of folate supplement per day (for females capable of pregnancy).  '[x]'$    Stressed the importance of regular exercise.   '[x]'$    Substance Abuse: Discussed  cessation/primary prevention of tobacco, alcohol, or other drug use; driving or other dangerous activities under the influence; availability of treatment for abuse.   '[x]'$    Injury prevention: Discussed safety belts, safety helmets, smoke detector, smoking near bedding or upholstery.   '[x]'$    Sexuality: Discussed sexually transmitted diseases, partner selection, use of condoms, avoidance of unintended pregnancy  and contraceptive alternatives.  '[x]'$    Dental health: Discussed importance of regular tooth brushing, flossing, and dental visits.  '[x]'$    Health maintenance and immunizations reviewed. Please refer to Health maintenance section.   CMA or LPN served as scribe during this visit. History, Physical, and Plan performed by medical provider. The above documentation has been reviewed and is accurate and complete.   Inda Coke, PA-C [facility]

## 2021-06-06 ENCOUNTER — Other Ambulatory Visit: Payer: Self-pay

## 2021-06-06 ENCOUNTER — Other Ambulatory Visit (INDEPENDENT_AMBULATORY_CARE_PROVIDER_SITE_OTHER): Payer: PPO

## 2021-06-06 DIAGNOSIS — E785 Hyperlipidemia, unspecified: Secondary | ICD-10-CM

## 2021-06-06 DIAGNOSIS — M25551 Pain in right hip: Secondary | ICD-10-CM

## 2021-06-06 DIAGNOSIS — M810 Age-related osteoporosis without current pathological fracture: Secondary | ICD-10-CM | POA: Diagnosis not present

## 2021-06-06 LAB — LIPID PANEL
Cholesterol: 256 mg/dL — ABNORMAL HIGH (ref 0–200)
HDL: 64.1 mg/dL (ref >39.00)
LDL Cholesterol: 167 mg/dL — ABNORMAL HIGH (ref 0–99)
NonHDL: 191.78
Total CHOL/HDL Ratio: 4
Triglycerides: 126 mg/dL (ref 0.0–149.0)
VLDL: 25.2 mg/dL (ref 0.0–40.0)

## 2021-06-06 LAB — CBC WITH DIFFERENTIAL/PLATELET
Basophils Absolute: 0 10*3/uL (ref 0.0–0.1)
Basophils Relative: 0.7 % (ref 0.0–3.0)
Eosinophils Absolute: 0.1 10*3/uL (ref 0.0–0.7)
Eosinophils Relative: 2.3 % (ref 0.0–5.0)
HCT: 39.4 % (ref 36.0–46.0)
Hemoglobin: 13.3 g/dL (ref 12.0–15.0)
Lymphocytes Relative: 32.1 % (ref 12.0–46.0)
Lymphs Abs: 1.9 10*3/uL (ref 0.7–4.0)
MCHC: 33.6 g/dL (ref 30.0–36.0)
MCV: 82.2 fl (ref 78.0–100.0)
Monocytes Absolute: 0.7 10*3/uL (ref 0.1–1.0)
Monocytes Relative: 10.9 % (ref 3.0–12.0)
Neutro Abs: 3.2 10*3/uL (ref 1.4–7.7)
Neutrophils Relative %: 54 % (ref 43.0–77.0)
Platelets: 347 10*3/uL (ref 150.0–400.0)
RBC: 4.79 Mil/uL (ref 3.87–5.11)
RDW: 14.1 % (ref 11.5–15.5)
WBC: 6 10*3/uL (ref 4.0–10.5)

## 2021-06-06 LAB — COMPREHENSIVE METABOLIC PANEL
ALT: 18 U/L (ref 0–35)
AST: 21 U/L (ref 0–37)
Albumin: 4.4 g/dL (ref 3.5–5.2)
Alkaline Phosphatase: 73 U/L (ref 39–117)
BUN: 12 mg/dL (ref 6–23)
CO2: 28 mEq/L (ref 19–32)
Calcium: 9.7 mg/dL (ref 8.4–10.5)
Chloride: 100 mEq/L (ref 96–112)
Creatinine, Ser: 0.69 mg/dL (ref 0.40–1.20)
GFR: 89.3 mL/min (ref >60.00)
Glucose, Bld: 90 mg/dL (ref 70–99)
Potassium: 4.1 mEq/L (ref 3.5–5.1)
Sodium: 137 mEq/L (ref 135–145)
Total Bilirubin: 0.5 mg/dL (ref 0.2–1.2)
Total Protein: 7.8 g/dL (ref 6.0–8.3)

## 2021-06-06 LAB — VITAMIN D 25 HYDROXY (VIT D DEFICIENCY, FRACTURES): VITD: 43.66 ng/mL (ref 30.00–100.00)

## 2021-06-10 ENCOUNTER — Encounter: Payer: Self-pay | Admitting: Physician Assistant

## 2021-06-11 ENCOUNTER — Other Ambulatory Visit: Payer: Self-pay

## 2021-06-11 ENCOUNTER — Other Ambulatory Visit: Payer: Self-pay | Admitting: Physician Assistant

## 2021-06-11 ENCOUNTER — Ambulatory Visit (INDEPENDENT_AMBULATORY_CARE_PROVIDER_SITE_OTHER)
Admission: RE | Admit: 2021-06-11 | Discharge: 2021-06-11 | Disposition: A | Discharge status: Home / Self Care | Payer: PPO | Source: Ambulatory Visit | Attending: Physician Assistant | Admitting: Physician Assistant

## 2021-06-11 DIAGNOSIS — M25551 Pain in right hip: Secondary | ICD-10-CM

## 2021-06-11 MED ORDER — ROSUVASTATIN CALCIUM 5 MG PO TABS: 5.0000 mg | ORAL_TABLET | Freq: Every day | ORAL | 3 refills | Status: DC

## 2021-06-12 ENCOUNTER — Encounter: Payer: Self-pay | Admitting: Physician Assistant

## 2021-06-12 DIAGNOSIS — M25551 Pain in right hip: Secondary | ICD-10-CM

## 2021-06-14 NOTE — Telephone Encounter (Signed)
Okay for PT for right hip pain?

## 2021-06-20 ENCOUNTER — Encounter: Payer: Self-pay | Admitting: Cardiology

## 2021-06-20 ENCOUNTER — Ambulatory Visit (INDEPENDENT_AMBULATORY_CARE_PROVIDER_SITE_OTHER): Payer: PPO | Admitting: Cardiology

## 2021-06-20 DIAGNOSIS — Z0000 Encounter for general adult medical examination without abnormal findings: Secondary | ICD-10-CM | POA: Diagnosis not present

## 2021-06-20 NOTE — Progress Notes (Addendum)
Subjective:   [name] is a 70 y.o. female who presents for an Initial Medicare Annual Wellness Visit.    I connected with  [name] on 06/20/21 by a audio enabled telemedicine application and verified that I am speaking with the correct person using two identifiers.   I discussed the limitations of evaluation and management by telemedicine. The patient expressed understanding and agreed to proceed.   Review of Systems    Defer to PCP Cardiac Risk Factors include: none     Objective:    Today's Vitals   06/20/21 1516  PainSc: 0-No pain   There is no height or weight on file to calculate BMI.  Advanced Directives 06/20/2021  Does Patient Have a Medical Advance Directive? Yes  Type of Paramedic of [place];Living will  Does patient want to make changes to medical advance directive? No - Patient declined  Copy of [place] in Chart? Yes - validated most recent copy scanned in chart (See row information)    Current Medications (verified) Outpatient Encounter Medications as of 06/20/2021  Medication Sig   aspirin 81 MG tablet Take 81 mg by mouth daily.   BIOTIN PO Take by mouth.   Calcium Carbonate-Vitamin D (CALTRATE 600+D PO) Take by mouth daily.    diazepam (VALIUM) 5 MG tablet Take 1 tablet (5 mg total) by mouth as needed for anxiety.   doxycycline (ADOXA) 50 MG tablet Take 50 mg by mouth. Takes twice a week   Estradiol (YUVAFEM) 10 MCG TABS vaginal tablet Use 1 tablet every 1-2 weeks as directed   metroNIDAZOLE (METROCREAM) 0.75 % cream Apply 1 application topically daily in the afternoon.   Misc Natural Products (GLUCOSAMINE CHOND COMPLEX/MSM) TABS Take 1 each by mouth daily.   Multiple Vitamin (MULTIVITAMIN) capsule Take 1 capsule by mouth daily.   Omega-3 Fatty Acids (FISH OIL) 1000 MG CAPS Take 2 each by mouth in the morning and at bedtime.   rosuvastatin (CRESTOR) 5 MG tablet Take 1 tablet (5 mg total) by mouth  daily.   No facility-administered encounter medications on file as of 06/20/2021.    Allergies (verified) Patient has no known allergies.   History: Past Medical History:  Diagnosis Date   GERD (gastroesophageal reflux disease)    Hyperlipidemia    Past Surgical History:  Procedure Laterality Date   ABDOMINAL HYSTERECTOMY     APPENDECTOMY  2006   disc ectomy     pheumothorax      PLEURAL SCARIFICATION     TONSILECTOMY/ADENOIDECTOMY WITH MYRINGOTOMY     Family History  Problem Relation Age of Onset   Heart disease Mother    Cancer Mother        breast cancer   Breast cancer Mother    Cancer Father        myo dysplasia   Hyperlipidemia Brother    Hypertension Brother    Mental illness Brother    Cancer Maternal Grandmother        cervical or uterine   Cancer Paternal Grandmother        ovarian   Cancer Paternal Grandfather        prostate   Social History   Socioeconomic History   Marital status: Married    Spouse name: [name]   Number of children: Not on file   Years of education: Not on file   Highest education level: Not on file  Occupational History   Occupation: Retired  Tobacco Use   Smoking status: Former    Types: Cigarettes    Quit date: 05/23/1996    Years since quitting: 25.0   Smokeless tobacco: Never  Substance and Sexual Activity   Alcohol use: No    Alcohol/week: 0.0 standard drinks   Drug use: No   Sexual activity: Yes  Other Topics Concern   Not on file  Social History Narrative   Married   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 [employer] [place] Strain: Low Risk    Difficulty of Paying Living Expenses: Not hard at [employer] Insecurity: No Food Insecurity   Worried About Charity fundraiser in [place]: Never true   Arboriculturist in the Last Year: Never true  Transportation Needs: No Transportation Needs   Lack of Transportation  (Medical): No   Lack of Transportation (Non-Medical): No  Physical Activity: Sufficiently Active   Days of Exercise per Week: 7 days   Minutes of Exercise per Session: 40 min  Stress: Stress Concern Present   Feeling of Stress : To some extent  Social Connections: Moderately Integrated   Frequency of Communication with Friends and Family: More than three times a week   Frequency of Social Gatherings with Friends and Family: Twice a week   Attends Religious Services: Never   Marine scientist or Organizations: Yes   Attends Music therapist: More than 4 times per year   Marital Status: Married    Tobacco Counseling Counseling given: Not Answered   Clinical Intake:  Pre-visit preparation completed: Yes  Pain : No/denies pain Pain Score: 0-No pain     Nutritional Risks: None Diabetes: No  How often do you need to have someone help you when you read instructions, pamphlets, or other written materials from your doctor or pharmacy?: 1 - Never What is the last grade level you completed in school?: 16 years  Diabetic NO  Interpreter Needed?: No  Information entered by :: Rennis Harding, RN   Activities of Daily Living In your present state of health, do you have any difficulty performing the following activities: 06/20/2021 06/04/2021  Hearing? N N  Vision? N N  Difficulty concentrating or making decisions? N N  Walking or climbing stairs? N N  Dressing or bathing? N N  Doing errands, shopping? N N  Preparing Food and eating ? N -  Using the Toilet? N -  In the past six months, have you accidently leaked urine? N -  Do you have problems with loss of bowel control? N -  Managing your Medications? N -  Managing your Finances? N -  Housekeeping or managing your Housekeeping? N -  Some recent data might be hidden    Patient Care Team: [name], [place] as PCP - General (Physician Assistant) Kristi Kenner, DO as Consulting Physician (Obstetrics  and Gynecology) Kristi Bookbinder, MD as Consulting Physician (Dermatology)  Indicate any recent Medical Services you may have received from other than [facility] providers in the past year (date may be approximate).     Assessment:   This is a routine wellness examination for [name].  Hearing/Vision screen No results found.  Dietary issues and exercise activities discussed: Current Exercise Habits: Home exercise routine, Type of exercise: walking, Time (Minutes): 40, Frequency (Times/Week): 7, Weekly Exercise (Minutes/Week): 280, Intensity: Moderate, Exercise limited by: None identified   Goals  Addressed   None   Depression Screen PHQ 2/9 Scores 06/20/2021 06/20/2021 06/04/2021 12/03/2020 03/21/2020 06/30/2018 06/18/2017  PHQ - 2 Score 0 0 0 0 0 0 0    Fall Risk Fall Risk  06/20/2021 12/03/2020 06/30/2018 09/09/2016 10/09/2015  Falls in the past year? 0 0 No No No  Number falls in past yr: 0 - - - -  Injury with Fall? 0 - - - -  Risk for fall due to : No Fall Risks - - - -    FALL RISK PREVENTION PERTAINING TO THE HOME:  Any stairs in or around the home? Yes  If so, are there any without handrails? Yes  Home free of loose throw rugs in walkways, pet beds, electrical cords, etc? Yes  Adequate lighting in your home to reduce risk of falls? Yes   ASSISTIVE DEVICES UTILIZED TO PREVENT FALLS:  Life alert? No  Use of a cane, walker or w/c? No  Grab bars in the bathroom? No  Shower chair or bench in shower? No  Elevated toilet seat or a handicapped toilet? No   TIMED UP AND GO:  Was the test performed? No .  Length of time to ambulate 10 feet: N/A sec.     Cognitive Function:     6CIT Screen 06/20/2021  What Year? 0 points  What month? 0 points  What time? 0 points  Count back from 20 0 points  Months in reverse 0 points  Repeat phrase 0 points  Total Score 0    Immunizations Immunization History  Administered Date(s) Administered   Fluad Quad(high Dose 65+) 06/04/2021    Influenza Inj Mdck Quad With Preservative 06/22/2017, 06/22/2018   Influenza Split 06/07/2012   Influenza, High Dose Seasonal PF 06/30/2018, 06/17/2020   Influenza,inj,Quad PF,6+ Mos 08/14/2014, 06/18/2017   Influenza-Unspecified 07/24/2015, 05/23/2016, 06/22/2016   PFIZER(Purple Top)SARS-COV-2 Vaccination 10/12/2019, 11/02/2019, 06/29/2020   Pneumococcal Conjugate-13 06/30/2018   Pneumococcal Polysaccharide-23 03/21/2020   Tdap 10/09/2015    TDAP status: Up to date  Flu Vaccine status: Up to date  Pneumococcal vaccine status: Up to date  Covid-19 vaccine status: Completed vaccines  Qualifies for Shingles Vaccine? Yes   Zostavax completed No   Shingrix Completed?: No.    Education has been provided regarding the importance of this vaccine. Patient has been advised to call insurance company to determine out of pocket expense if they have not yet received this vaccine. Advised may also receive vaccine at local pharmacy or Health Dept. Verbalized acceptance and understanding.  Screening Tests Health Maintenance  Topic Date Due   URINE MICROALBUMIN  Never done   COVID-19 Vaccine (4 - Booster for Pfizer series) 10/30/2020   Zoster Vaccines- Shingrix (1 of 2) 09/03/2021 (Originally 03/21/2003)   DEXA SCAN  03/31/2023   MAMMOGRAM  05/15/2023   TETANUS/TDAP  10/08/2025   COLONOSCOPY (Pts 45-61yrs Insurance coverage will need to be confirmed)  11/21/2027   INFLUENZA VACCINE  Completed   Hepatitis C Screening  Completed   HPV VACCINES  Aged Out    Health Maintenance  Health Maintenance Due  Topic Date Due   URINE MICROALBUMIN  Never done   COVID-19 Vaccine (4 - Booster for [place] series) 10/30/2020    Colorectal cancer screening: Type of screening: Colonoscopy. Completed 2019. Repeat every 10 years  Mammogram status: Completed 2022. Repeat every year   Bone Density status: Completed 2021. Results reflect: Bone density results: OSTEOPOROSIS. Repeat every 2 years.  Lung Cancer  Screening: (Low Dose CT  Chest recommended if Age 76-80 years, 30 pack-year currently smoking OR have quit w/in 15years.) does not qualify.   Lung Cancer Screening Referral: N/A  Additional Screening:  Hepatitis C Screening: does not qualify; Completed   Vision Screening: Recommended annual ophthalmology exams for early detection of glaucoma and other disorders of the eye. Is the patient up to date with their annual eye exam?  Yes  Who is the provider or what is the name of the office in which the patient attends annual eye exams? Dr Gillian Scarce If pt is not established with a provider, would they like to be referred to a provider to establish care? No .   Dental Screening: Recommended annual dental exams for proper oral hygiene  Community Resource Referral / Chronic Care Management: CRR required this visit?  No   CCM required this visit?  No      Plan:     I have personally reviewed and noted the following in the patient's chart:   Medical and social history Use of alcohol, tobacco or illicit drugs  Current medications and supplements including opioid prescriptions. Patient is not currently taking opioid prescriptions. Functional ability and status Nutritional status Physical activity Advanced directives List of other physicians Hospitalizations, surgeries, and ER visits in previous 12 months Vitals Screenings to include cognitive, depression, and falls Referrals and appointments  In addition, I have reviewed and discussed with patient certain preventive protocols, quality metrics, and best practice recommendations. A written personalized care plan for preventive services as well as general preventive health recommendations were provided to patient.    Patient is going to get 4th Covid booster and the shingles vaccine soon   Rennis Harding, RN   06/20/2021   Nurse Notes:   Non-Face to Face 40 minute visit   Ms. Justen , Thank you for taking time to come for your  Medicare Wellness Visit. I appreciate your ongoing commitment to your health goals. Please review the following plan we discussed and let me know if I can assist you in the future.   These are the goals we discussed:  Goals   None     This is a list of the screening recommended for you and due dates:  Health Maintenance  Topic Date Due   Urine Protein Check  Never done   COVID-19 Vaccine (4 - Booster for Pfizer series) 10/30/2020   Zoster (Shingles) Vaccine (1 of 2) 09/03/2021*   DEXA scan (bone density measurement)  03/31/2023   Mammogram  05/15/2023   Tetanus Vaccine  10/08/2025   Colon Cancer Screening  11/21/2027   Flu Shot  Completed   Hepatitis C Screening: USPSTF Recommendation to screen - Ages 18-79 yo.  Completed   HPV Vaccine  Aged Out  *Topic was postponed. The date shown is not the original due date.    I have reviewed documentation for AWV and Advance Care planning provided by [facility], I agree with documentation, I was immediately available for any questions. [name], [place]

## 2021-06-20 NOTE — Patient Instructions (Signed)
Health Maintenance, Female Adopting a healthy lifestyle and getting preventive care are important in promoting health and wellness. Ask your health care provider about: The right schedule for you to have regular tests and exams. Things you can do on your own to prevent diseases and keep yourself healthy. What should I know about diet, weight, and exercise? Eat a healthy diet  Eat a diet that includes plenty of vegetables, fruits, low-fat dairy products, and lean protein. Do not eat a lot of foods that are high in solid fats, added sugars, or sodium. Maintain a healthy weight Body mass index (BMI) is used to identify weight problems. It estimates body fat based on height and weight. Your health care provider can help determine your BMI and help you achieve or maintain a healthy weight. Get regular exercise Get regular exercise. This is one of the most important things you can do for your health. Most adults should: Exercise for at least 150 minutes each week. The exercise should increase your heart rate and make you sweat (moderate-intensity exercise). Do strengthening exercises at least twice a week. This is in addition to the moderate-intensity exercise. Spend less time sitting. Even light physical activity can be beneficial. Watch cholesterol and blood lipids Have your blood tested for lipids and cholesterol at 68 years of age, then have this test every 5 years. Have your cholesterol levels checked more often if: Your lipid or cholesterol levels are high. You are older than 68 years of age. You are at high risk for heart disease. What should I know about cancer screening? Depending on your health history and family history, you may need to have cancer screening at various ages. This may include screening for: Breast cancer. Cervical cancer. Colorectal cancer. Skin cancer. Lung cancer. What should I know about heart disease, diabetes, and high blood pressure? Blood pressure and heart  disease High blood pressure causes heart disease and increases the risk of stroke. This is more likely to develop in people who have high blood pressure readings, are of African descent, or are overweight. Have your blood pressure checked: Every 3-5 years if you are 18-39 years of age. Every year if you are 40 years old or older. Diabetes Have regular diabetes screenings. This checks your fasting blood sugar level. Have the screening done: Once every three years after age 40 if you are at a normal weight and have a low risk for diabetes. More often and at a younger age if you are overweight or have a high risk for diabetes. What should I know about preventing infection? Hepatitis B If you have a higher risk for hepatitis B, you should be screened for this virus. Talk with your health care provider to find out if you are at risk for hepatitis B infection. Hepatitis C Testing is recommended for: Everyone born from 1945 through 1965. Anyone with known risk factors for hepatitis C. Sexually transmitted infections (STIs) Get screened for STIs, including gonorrhea and chlamydia, if: You are sexually active and are younger than 68 years of age. You are older than 68 years of age and your health care provider tells you that you are at risk for this type of infection. Your sexual activity has changed since you were last screened, and you are at increased risk for chlamydia or gonorrhea. Ask your health care provider if you are at risk. Ask your health care provider about whether you are at high risk for HIV. Your health care provider may recommend a prescription medicine   to help prevent HIV infection. If you choose to take medicine to prevent HIV, you should first get tested for HIV. You should then be tested every 3 months for as long as you are taking the medicine. Pregnancy If you are about to stop having your period (premenopausal) and you may become pregnant, seek counseling before you get  pregnant. Take 400 to 800 micrograms (mcg) of folic acid every day if you become pregnant. Ask for birth control (contraception) if you want to prevent pregnancy. Osteoporosis and menopause Osteoporosis is a disease in which the bones lose minerals and strength with aging. This can result in bone fractures. If you are 65 years old or older, or if you are at risk for osteoporosis and fractures, ask your health care provider if you should: Be screened for bone loss. Take a calcium or vitamin D supplement to lower your risk of fractures. Be given hormone replacement therapy (HRT) to treat symptoms of menopause. Follow these instructions at home: Lifestyle Do not use any products that contain nicotine or tobacco, such as cigarettes, e-cigarettes, and chewing tobacco. If you need help quitting, ask your health care provider. Do not use street drugs. Do not share needles. Ask your health care provider for help if you need support or information about quitting drugs. Alcohol use Do not drink alcohol if: Your health care provider tells you not to drink. You are pregnant, may be pregnant, or are planning to become pregnant. If you drink alcohol: Limit how much you use to 0-1 drink a day. Limit intake if you are breastfeeding. Be aware of how much alcohol is in your drink. In the [place] one drink equals one 12 oz bottle of beer (355 mL), one 5 oz glass of wine (148 mL), or one 1 oz glass of hard liquor (44 mL). General instructions Schedule regular health, dental, and eye exams. Stay current with your vaccines. Tell your health care provider if: You often feel depressed. You have ever been abused or do not feel safe at home. Summary Adopting a healthy lifestyle and getting preventive care are important in promoting health and wellness. Follow your health care provider's instructions about healthy diet, exercising, and getting tested or screened for diseases. Follow your health care provider's  instructions on monitoring your cholesterol and blood pressure. This information is not intended to replace advice given to you by your health care provider. Make sure you discuss any questions you have with your health care provider. Document Revised: 11/16/2020 Document Reviewed: 09/01/2018 Elsevier Patient Education  2022 [employer] Inc.  

## 2021-07-09 ENCOUNTER — Encounter: Payer: Self-pay | Admitting: Physician Assistant

## 2021-07-09 DIAGNOSIS — K219 Gastro-esophageal reflux disease without esophagitis: Secondary | ICD-10-CM

## 2021-07-11 ENCOUNTER — Ambulatory Visit: Payer: PPO | Admitting: Physical Therapy

## 2021-07-11 ENCOUNTER — Other Ambulatory Visit: Payer: Self-pay

## 2021-07-11 ENCOUNTER — Encounter: Payer: Self-pay | Admitting: Gastroenterology

## 2021-07-11 DIAGNOSIS — M25551 Pain in right hip: Secondary | ICD-10-CM

## 2021-07-12 IMAGING — MG DIGITAL SCREENING BILAT W/ CAD
4 series · 4 of 4 positions shown · non-contrast
Comparison: Previous exam(s).

CLINICAL DATA: Screening.

EXAM:
DIGITAL SCREENING BILATERAL MAMMOGRAM WITH CAD

[L MLO]
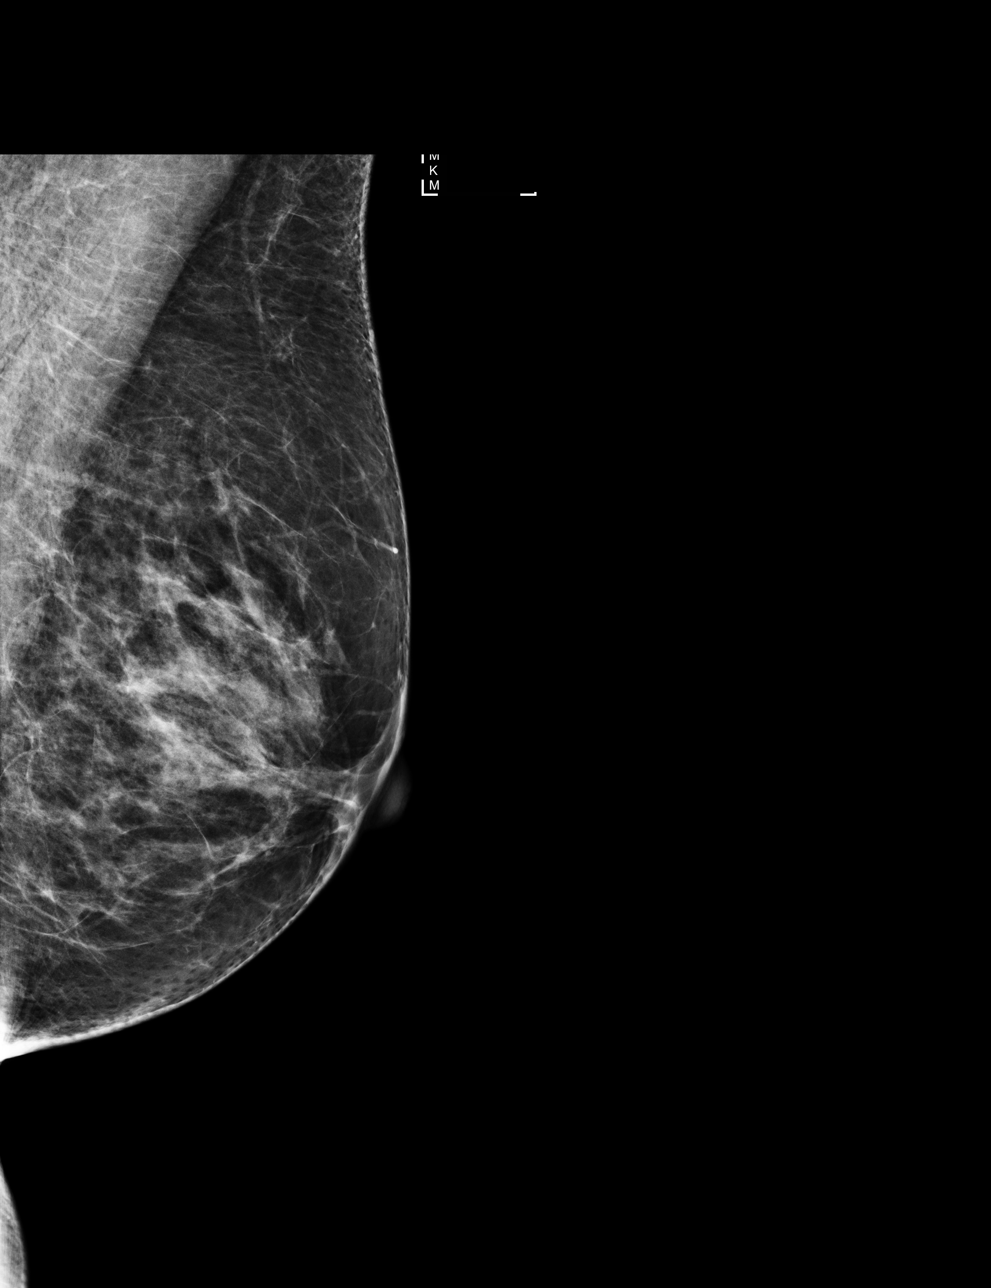

[R CC]
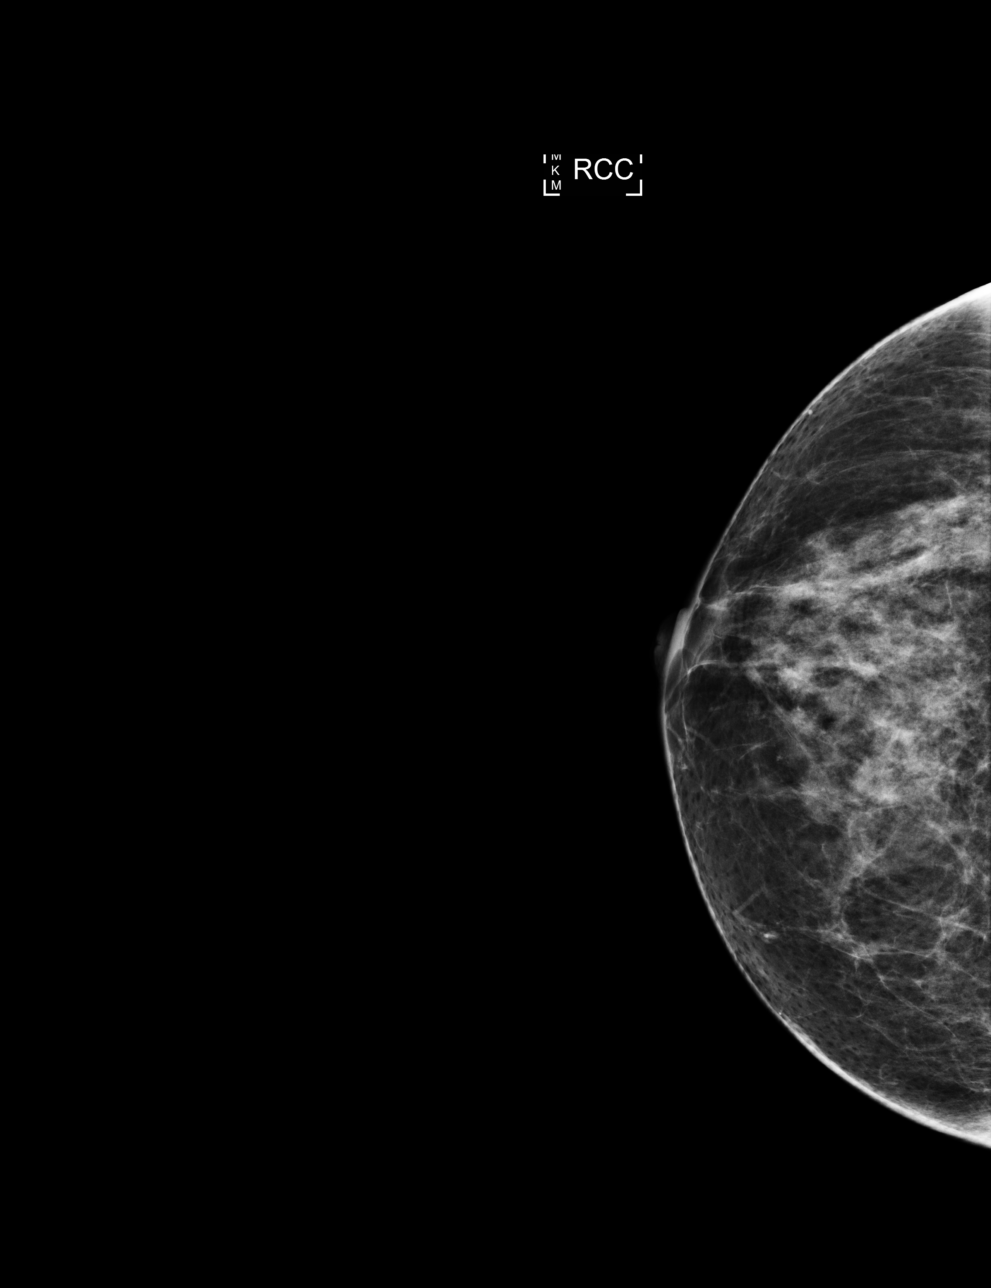

[L CC]
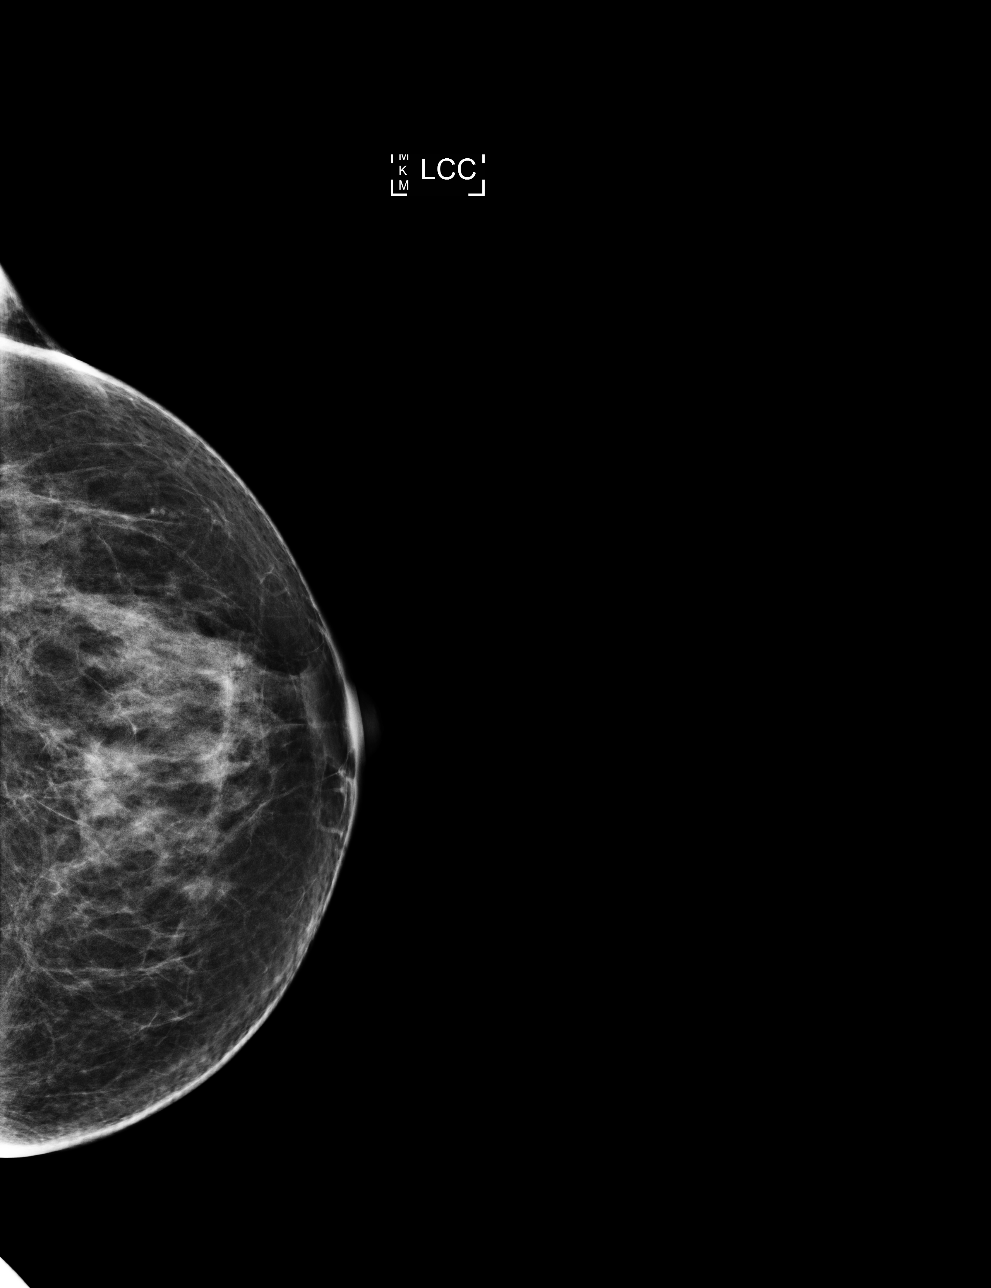

[R MLO]
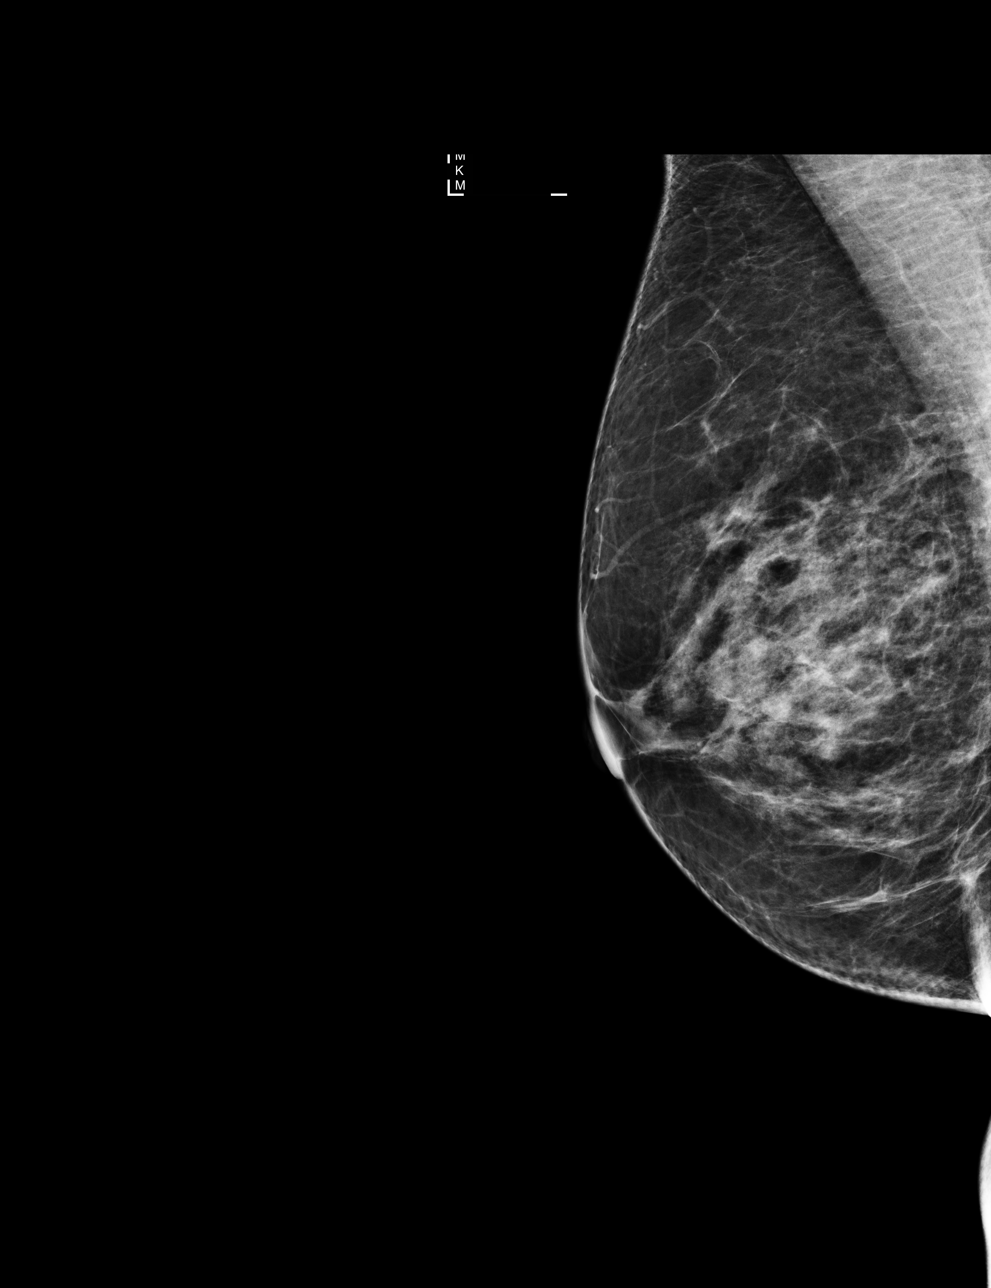

[4 of 4 positions shown; findings below may reference images not displayed]

ACR Breast Density Category c: The breast tissue is heterogeneously
dense, which may obscure small masses.
FINDINGS: There are no findings suspicious for malignancy. Images were
processed with CAD.
IMPRESSION: No mammographic evidence of malignancy. A result letter of this
screening mammogram will be mailed directly to the patient.

RECOMMENDATION:
Screening mammogram in one year. (Code:[id number])

BI-RADS CATEGORY  1: Negative.

## 2021-07-21 ENCOUNTER — Encounter: Payer: Self-pay | Admitting: Physical Therapy

## 2021-07-21 NOTE — Patient Instructions (Signed)
Access Code: Access Code: [email or web address] URL: [email or web address] Date: 07/21/2021 Prepared by: Lyndee Hensen  Exercises Supine Figure 4 Piriformis Stretch - 2 x daily - 3 reps - 30 hold Supine Piriformis Stretch Pulling Heel to Hip - 2 x daily - 3 reps - 30 hold Supine Piriformis Stretch with Leg Straight - 2 x daily - 3 reps - 30 hold

## 2021-07-21 NOTE — Therapy (Signed)
[facility] [address] [place], [place], [address] Phone: [phone]   Fax:  [phone]  Physical Therapy Evaluation  Patient Details  Name: [name] MRN: [id number] Date of Birth: 04-14-1951 Referring Provider (PT): Inda Coke   Encounter Date: 07/11/2021   PT End of Session - 07/21/21 0820     Visit Number 1    Number of Visits 12    Date for PT Re-Evaluation 08/22/21    Authorization Type HTA    PT Start Time 1518    PT Stop Time 1600    PT Time Calculation (min) 42 min    Activity Tolerance Patient tolerated treatment well    Behavior During Therapy [facility] for tasks assessed/performed             Past Medical History:  Diagnosis Date   GERD (gastroesophageal reflux disease)    Hyperlipidemia     Past Surgical History:  Procedure Laterality Date   ABDOMINAL HYSTERECTOMY     APPENDECTOMY  2006   disc ectomy     pheumothorax      PLEURAL SCARIFICATION     TONSILECTOMY/ADENOIDECTOMY WITH MYRINGOTOMY      There were no vitals filed for this visit.    Subjective Assessment - 07/21/21 0815     Subjective Pt statees R hip pain. She has intermittent pain, does have days without pain. If she is active it feels better. Likes to walk, up to 2 mi.  Pain workse If she is less active, also with riding in car for hours.  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    Limitations Sitting    Patient Stated Goals decreased pain.    Currently in Pain? Yes    Pain Score 6     Pain Location Hip    Pain Descriptors / Indicators Aching    Pain Type Acute pain    Pain Onset More than a month ago    Pain Frequency Intermittent    Aggravating Factors  less activity, riding in car,    Pain Relieving Factors movement, activity                [facility] PT Assessment - 07/21/21 0001       Assessment   Medical Diagnosis R hip pain    Referring Provider (PT) Inda Coke    Prior Therapy  no      Precautions   Precautions None      Balance Screen   Has the patient fallen in the past 6 months No      Prior Function   Level of Independence Independent      Cognition   Overall Cognitive Status Within Functional Limits for tasks assessed      ROM / Strength   AROM / PROM / Strength AROM;Strength      AROM   Overall AROM Comments HIp: WFL, Lumbar: mild limitation for flex/ext.      Strength   Overall Strength Comments HIp: 4-/5, Knee: 5/5      Palpation   Palpation comment Tightness and tenderness in R glute, piriformis and glute min,      Special Tests   Other special tests Neg Faber, fadir, Neg SLR,                        Objective measurements completed on examination: See above findings.       [facility] Adult PT Treatment/Exercise -  07/21/21 0001       Exercises   Exercises Knee/Hip      Knee/Hip Exercises: Stretches   Piriformis Stretch 3 reps;30 seconds    Piriformis Stretch Limitations supine fig 4, modified fig 4, and HIp IR across body      Manual Therapy   Manual Therapy Soft tissue mobilization    Soft tissue mobilization DTM/TPR to R glute and piriformis.                     PT Education - 07/21/21 0820     Education Details PT POC, Exam findings, HEP    Person(s) Educated Patient    Methods Explanation;Demonstration;Tactile cues;Verbal cues;Handout    Comprehension Verbalized understanding;Returned demonstration;Verbal cues required;Tactile cues required;Need further instruction              PT Short Term Goals - 07/21/21 0826       PT SHORT TERM GOAL #1   Title P tto be independent with initial HEP    Time 2    Period Weeks    Status New    Target Date 07/25/21               PT Long Term Goals - 07/21/21 0826       PT LONG TERM GOAL #1   Title Pt to be independent wtih initial HEP    Time 6    Period Weeks    Target Date 08/22/21      PT LONG TERM GOAL #2   Title Pt to report  decreased pain in R hip to 0-2/10    Time 6    Period Weeks    Status New    Target Date 08/22/21      PT LONG TERM GOAL #3   Title Pt to demo improved strength of bil hips to at least 4+/5 to improve stability and pain    Time 6    Period Weeks    Status New    Target Date 08/22/21      PT LONG TERM GOAL #4   Title Pt to demo soft tissue restrictions in R hip to be WNL with decreased pain with palpation of R hip musculature.    Time 6    Period Weeks    Status New    Target Date 08/22/21                    Plan - 07/21/21 5732     Clinical Impression Statement Pt presents with primary complaint of increased pain in R hip. She has tenderness with palpation of hip and glute musculature, with tightness and trigger points. SHe has weakness in hips, with lack of effective HEP for strengthening. She will benefit from increased strength in R hip for improved stability and pain. Pt with decreased ability for full funcitonal activities, due to pain. She will benefit from skilled PT to improve deficits and pain.    Examination-Activity Limitations Stand;Locomotion Level;Squat;Stairs;Sit    Examination-Participation Restrictions Yard Work;Community Activity;Shop    Stability/Clinical Decision Making Stable/Uncomplicated    Clinical Decision Making Low    Rehab Potential Good    PT Frequency 2x / week    PT Duration 6 weeks    PT Treatment/Interventions ADLs/Self Care Home Management;Canalith Repostioning;Cryotherapy;Electrical Stimulation;Ultrasound;Iontophoresis 4mg /ml Dexamethasone;Moist Heat;Gait training;Stair training;Functional mobility training;Therapeutic activities;Therapeutic exercise;Balance training;Patient/family education;Neuromuscular re-education;Manual techniques;Passive range of motion;Dry needling;Taping;Spinal Manipulations;Joint Manipulations    PT Home Exercise Plan [email or web address]    Consulted  and Agree with Plan of Care Patient             Patient will  benefit from skilled therapeutic intervention in order to improve the following deficits and impairments:  Pain, Decreased strength, Decreased activity tolerance, Impaired flexibility, Increased muscle spasms  Visit Diagnosis: Pain in right hip     Problem List Patient Active Problem List   Diagnosis Date Noted   Endometriosis 06/04/2021   Atrophic vaginitis 06/04/2021   Reduced libido 06/04/2021   Hyperlipidemia, mild 12/03/2020   Trigger thumb, left thumb 11/09/2019   Stress incontinence 06/30/2018   TMJ pain dysfunction syndrome 07/20/2017   S/P total abdominal hysterectomy 10/03/2016   Abnormal mammogram 02/05/2016   Osteoporosis without current pathological fracture 12/13/2015   Nonspecific abnormal electrocardiogram (ECG) (EKG) 09/27/2013   Collagenous colitis 09/23/2003   Lyndee Hensen, PT, DPT 8:51 AM  07/21/21    [facility] [place], [place], [address] Phone: [phone]   Fax:  [phone]  Name: [name] MRN: [id number] Date of Birth: 1951/07/29

## 2021-07-22 ENCOUNTER — Other Ambulatory Visit: Payer: Self-pay

## 2021-07-22 ENCOUNTER — Encounter: Payer: Self-pay | Admitting: Physical Therapy

## 2021-07-22 ENCOUNTER — Ambulatory Visit: Payer: PPO | Admitting: Physical Therapy

## 2021-07-22 DIAGNOSIS — M25551 Pain in right hip: Secondary | ICD-10-CM

## 2021-07-22 NOTE — Therapy (Signed)
[facility] [address] [place], [place], [address] Phone: [phone]   Fax:  [phone]  Physical Therapy Treatment  Patient Details  Name: [name] MRN: [id number] Date of Birth: 1951/03/02 Referring Provider (PT): Inda Coke   Encounter Date: 07/22/2021   PT End of Session - 07/22/21 1131     Visit Number 2    Number of Visits 12    Date for PT Re-Evaluation 08/22/21    Authorization Type HTA    PT Start Time 1106    PT Stop Time 1145    PT Time Calculation (min) 39 min    Activity Tolerance Patient tolerated treatment well    Behavior During Therapy [facility] for tasks assessed/performed             Past Medical History:  Diagnosis Date   GERD (gastroesophageal reflux disease)    Hyperlipidemia     Past Surgical History:  Procedure Laterality Date   ABDOMINAL HYSTERECTOMY     APPENDECTOMY  2006   disc ectomy     pheumothorax      PLEURAL SCARIFICATION     TONSILECTOMY/ADENOIDECTOMY WITH MYRINGOTOMY      There were no vitals filed for this visit.   Subjective Assessment - 07/22/21 1130     Subjective Pt states minimal pain in R hip. Did have some increased pain in low back and SI after Eval.    Patient Stated Goals decreased pain.    Currently in Pain? Yes    Pain Score 3     Pain Location Hip    Pain Orientation Right    Pain Descriptors / Indicators Aching    Pain Type Acute pain    Pain Onset More than a month ago    Pain Frequency Intermittent                               [facility] Adult PT Treatment/Exercise - 07/22/21 0001       Exercises   Exercises Knee/Hip      Knee/Hip Exercises: Stretches   Piriformis Stretch 3 reps;30 seconds    Piriformis Stretch Limitations supine fig 4, seated, and HIp IR across body      Knee/Hip Exercises: Aerobic   Recumbent Bike L1 x 6 min; (move to level 2)      Knee/Hip Exercises: Supine   Bridges 15 reps    Straight Leg  Raises 10 reps;Both    Straight Leg Raises Limitations with TA      Knee/Hip Exercises: Sidelying   Hip ABduction 10 reps;2 sets    Clams x15 bil;      Manual Therapy   Manual Therapy Soft tissue mobilization;Joint mobilization    Joint Mobilization lumbar PA mobs, SI PA mobs gr 2-3, Cavitation at R SI    Soft tissue mobilization DTM/TPR to R glute and piriformis.                     PT Education - 07/22/21 1131     Education Details HEP updated to include strengthening.    Person(s) Educated Patient    Methods Explanation;Demonstration;Tactile cues;Verbal cues;Handout    Comprehension Verbalized understanding;Returned demonstration;Verbal cues required;Tactile cues required;Need further instruction              PT Short Term Goals - 07/21/21 0826       PT SHORT TERM GOAL #1   Title P  tto be independent with initial HEP    Time 2    Period Weeks    Status New    Target Date 07/25/21               PT Long Term Goals - 07/21/21 0826       PT LONG TERM GOAL #1   Title Pt to be independent wtih initial HEP    Time 6    Period Weeks    Target Date 08/22/21      PT LONG TERM GOAL #2   Title Pt to report decreased pain in R hip to 0-2/10    Time 6    Period Weeks    Status New    Target Date 08/22/21      PT LONG TERM GOAL #3   Title Pt to demo improved strength of bil hips to at least 4+/5 to improve stability and pain    Time 6    Period Weeks    Status New    Target Date 08/22/21      PT LONG TERM GOAL #4   Title Pt to demo soft tissue restrictions in R hip to be WNL with decreased pain with palpation of R hip musculature.    Time 6    Period Weeks    Status New    Target Date 08/22/21                   Plan - 07/22/21 1202     Clinical Impression Statement Pt with mild soreness in SI today, which is a newer pain. Pt had cavitation with light SI mob today, some improved pain after this. Discussed stretching for low back for  and going easy with piriformis stretches for improved soreness in SI. Pt with minimal hip pain with activities today. Added strengthening today, challenged due to weakness bilaterally. She has mild soreness in R glute with soft tissue work today, but improved from last visit. Plan to continue strengthening as tolerated.    Examination-Activity Limitations Stand;Locomotion Level;Squat;Stairs;Sit    Examination-Participation Restrictions Yard Work;Community Activity;Shop    Stability/Clinical Decision Making Stable/Uncomplicated    Rehab Potential Good    PT Frequency 2x / week    PT Duration 6 weeks    PT Treatment/Interventions ADLs/Self Care Home Management;Canalith Repostioning;Cryotherapy;Electrical Stimulation;Ultrasound;Iontophoresis 4mg /ml Dexamethasone;Moist Heat;Gait training;Stair training;Functional mobility training;Therapeutic activities;Therapeutic exercise;Balance training;Patient/family education;Neuromuscular re-education;Manual techniques;Passive range of motion;Dry needling;Taping;Spinal Manipulations;Joint Manipulations    PT Home Exercise Plan [email or web address]    Consulted and Agree with Plan of Care Patient             Patient will benefit from skilled therapeutic intervention in order to improve the following deficits and impairments:  Pain, Decreased strength, Decreased activity tolerance, Impaired flexibility, Increased muscle spasms  Visit Diagnosis: Pain in right hip     Problem List Patient Active Problem List   Diagnosis Date Noted   Endometriosis 06/04/2021   Atrophic vaginitis 06/04/2021   Reduced libido 06/04/2021   Hyperlipidemia, mild 12/03/2020   Trigger thumb, left thumb 11/09/2019   Stress incontinence 06/30/2018   TMJ pain dysfunction syndrome 07/20/2017   S/P total abdominal hysterectomy 10/03/2016   Abnormal mammogram 02/05/2016   Osteoporosis without current pathological fracture 12/13/2015   Nonspecific abnormal electrocardiogram (ECG) (EKG)  09/27/2013   Collagenous colitis 09/23/2003   Lyndee Hensen, PT, DPT 12:09 PM  07/22/21    [facility] [place], [place], [address] Phone: [phone]  Fax:  [phone]  Name: [name] MRN: [id number] Date of Birth: [birth year]

## 2021-08-01 ENCOUNTER — Other Ambulatory Visit: Payer: Self-pay

## 2021-08-01 ENCOUNTER — Ambulatory Visit (INDEPENDENT_AMBULATORY_CARE_PROVIDER_SITE_OTHER): Payer: PPO | Admitting: Physical Therapy

## 2021-08-01 DIAGNOSIS — M25551 Pain in right hip: Secondary | ICD-10-CM

## 2021-08-02 ENCOUNTER — Encounter: Payer: Self-pay | Admitting: Physical Therapy

## 2021-08-02 NOTE — Therapy (Signed)
[facility] [address] [place], [place], [address] Phone: [phone]   Fax:  [phone]  Physical Therapy Treatment  Patient Details  Name: [name] MRN: [id number] Date of Birth: [birth year] Referring Provider (PT): Inda Coke   Encounter Date: 08/01/2021   PT End of Session - 08/02/21 0934     Visit Number 3    Number of Visits 12    Date for PT Re-Evaluation 08/22/21    Authorization Type HTA    PT Start Time 1515    PT Stop Time 7972    PT Time Calculation (min) 32 min    Activity Tolerance Patient tolerated treatment well    Behavior During Therapy [facility] for tasks assessed/performed             Past Medical History:  Diagnosis Date   GERD (gastroesophageal reflux disease)    Hyperlipidemia     Past Surgical History:  Procedure Laterality Date   ABDOMINAL HYSTERECTOMY     APPENDECTOMY  2006   disc ectomy     pheumothorax      PLEURAL SCARIFICATION     TONSILECTOMY/ADENOIDECTOMY WITH MYRINGOTOMY      There were no vitals filed for this visit.   Subjective Assessment - 08/02/21 0933     Subjective Pt states no pain in hip for a week or so. She has no pain in SI, but feels it "clicking " a lot.    Currently in Pain? No/denies    Pain Score 0-No pain                               [facility] Adult PT Treatment/Exercise - 08/02/21 0001       Exercises   Exercises Knee/Hip      Knee/Hip Exercises: Stretches   Piriformis Stretch 3 reps;30 seconds    Piriformis Stretch Limitations supine fig 4, seated, and HIp IR across body      Knee/Hip Exercises: Aerobic   Recumbent Bike L2 x 3  min;;L 1 x 5 min;      Knee/Hip Exercises: Standing   Hip Abduction 10 reps;Both    Abduction Limitations YTB    Functional Squat 15 reps    Functional Squat Limitations at mat table      Knee/Hip Exercises: Seated   Sit to Sand 10 reps      Knee/Hip Exercises: Supine   Bridges 20 reps     Single Leg Bridge 10 reps;Both    Straight Leg Raises 10 reps;Both    Straight Leg Raises Limitations with TA    Other Supine Knee/Hip Exercises Hip abd/ add x 15 each;      Knee/Hip Exercises: Sidelying   Hip ABduction 10 reps;2 sets      Manual Therapy   Manual Therapy Soft tissue mobilization;Joint mobilization                     PT Education - 08/02/21 0934     Education Details Final HEP reviewed today    Person(s) Educated Patient    Methods Explanation;Demonstration;Handout;Verbal cues    Comprehension Verbalized understanding;Verbal cues required;Returned demonstration              PT Short Term Goals - 08/02/21 0935       PT SHORT TERM GOAL #1   Title P tto be independent with initial HEP    Time 2  Period Weeks    Status Achieved    Target Date 07/25/21               PT Long Term Goals - 08/02/21 0938       PT LONG TERM GOAL #1   Title Pt to be independent wtih initial HEP    Time 6    Period Weeks    Status Achieved      PT LONG TERM GOAL #2   Title Pt to report decreased pain in R hip to 0-2/10    Time 6    Period Weeks    Status Achieved      PT LONG TERM GOAL #3   Title Pt to demo improved strength of bil hips to at least 4+/5 to improve stability and pain    Time 6    Period Weeks    Status Achieved      PT LONG TERM GOAL #4   Title Pt to demo soft tissue restrictions in R hip to be WNL with decreased pain with palpation of R hip musculature.    Time 6    Period Weeks    Status Achieved                   Plan - 08/02/21 0939     Clinical Impression Statement Pt making good progress. She has had no hip pain in 1-2 weeks. She is doing well with HEP for this, updated and reviewed today, for best strengthening for hips. She has less pain in SI today,does have some soreness here as baseline, discussed best strengthening and stretches for this as well. Pt has met goals at this time, ready for d/c to HEP. Pt in  agreement with plan.    Examination-Activity Limitations Stand;Locomotion Level;Squat;Stairs;Sit    Examination-Participation Restrictions Yard Work;Community Activity;Shop    Stability/Clinical Decision Making Stable/Uncomplicated    Rehab Potential Good    PT Frequency 2x / week    PT Duration 6 weeks    PT Treatment/Interventions ADLs/Self Care Home Management;Canalith Repostioning;Cryotherapy;Electrical Stimulation;Ultrasound;Iontophoresis 81m/ml Dexamethasone;Moist Heat;Gait training;Stair training;Functional mobility training;Therapeutic activities;Therapeutic exercise;Balance training;Patient/family education;Neuromuscular re-education;Manual techniques;Passive range of motion;Dry needling;Taping;Spinal Manipulations;Joint Manipulations    PT Home Exercise Plan [email or web address]   Consulted and Agree with Plan of Care Patient             Patient will benefit from skilled therapeutic intervention in order to improve the following deficits and impairments:  Pain, Decreased strength, Decreased activity tolerance, Impaired flexibility, Increased muscle spasms  Visit Diagnosis: Pain in right hip     Problem List Patient Active Problem List   Diagnosis Date Noted   Endometriosis 06/04/2021   Atrophic vaginitis 06/04/2021   Reduced libido 06/04/2021   Hyperlipidemia, mild 12/03/2020   Trigger thumb, left thumb 11/09/2019   Stress incontinence 06/30/2018   TMJ pain dysfunction syndrome 07/20/2017   S/P total abdominal hysterectomy 10/03/2016   Abnormal mammogram 02/05/2016   Osteoporosis without current pathological fracture 12/13/2015   Nonspecific abnormal electrocardiogram (ECG) (EKG) 09/27/2013   Collagenous colitis 09/23/2003   LLyndee Hensen PT, DPT 9:41 AM  08/02/21    [facility] [address]Phone: [phone]  Fax:  [phone] Name: Kristi MEDINGERMRN: [id number] of Birth:  [birth year] PHYSICAL THERAPY DISCHARGE SUMMARY  Visits from Start of Care: 3 Plan: Patient agrees to discharge.  Patient goals were met. Patient is being discharged due to meeting  the stated rehab goals.     Lyndee Hensen, PT, DPT 9:41 AM  08/02/21

## 2021-08-02 NOTE — Patient Instructions (Signed)
Access Code: [email or web address] URL: [email or web address] Date: 08/02/2021 Prepared by: Lyndee Hensen  Exercises Supine Figure 4 Piriformis Stretch - 2 x daily - 3 reps - 30 hold Supine Piriformis Stretch with Leg Straight - 2 x daily - 3 reps - 30 hold Supine Bridge - 2 x daily - 2 sets - 10 reps Straight Leg Raise - 2 x daily - 2 sets - 10 reps Sidelying Hip Abduction - 2 x daily - 2 sets - 10 reps Clamshell - 2 x daily - 2 sets - 10 reps Hooklying Isometric Clamshell - 1 x daily - 2 sets - 10 reps Supine Hip Adduction Isometric with Ball - 1 x daily - 2 sets - 10 reps Mini Squat - 1 x daily - 2 sets - 10 reps

## 2021-08-05 ENCOUNTER — Encounter: Payer: PPO | Admitting: Physical Therapy

## 2021-08-09 ENCOUNTER — Ambulatory Visit: Payer: PPO | Admitting: Gastroenterology

## 2021-08-11 ENCOUNTER — Other Ambulatory Visit: Payer: Self-pay | Admitting: Family

## 2021-08-18 ENCOUNTER — Other Ambulatory Visit: Payer: Self-pay | Admitting: Family

## 2021-08-18 ENCOUNTER — Encounter: Payer: Self-pay | Admitting: Physician Assistant

## 2021-08-19 MED ORDER — ESTRADIOL 10 MCG VA TABS: ORAL_TABLET | VAGINAL | 1 refills | Status: DC

## 2021-08-20 ENCOUNTER — Telehealth: Payer: Self-pay

## 2021-08-20 MED ORDER — ESTRADIOL 10 MCG VA TABS: 1.0000 | ORAL_TABLET | VAGINAL | 1 refills | Status: DC

## 2021-08-20 NOTE — Telephone Encounter (Signed)
Patient has called in requesting change to script for yuvafem.  States insurance will only cover to have 16 tablets filled with the statement " 1 tablet every 1-2 weeks".    Patient is requesting statement to be changed to "1-2 tablets every week" in order to receive 24 tablets.  Patient states the medication cost her $150 and would like to be able to get the 24 tablets.    Please give patient a follow up call in regard or once script has been sent in at [phone].

## 2021-08-20 NOTE — Telephone Encounter (Signed)
Spoke to pt see My Chart message. Rx corrected.

## 2021-08-20 NOTE — Addendum Note (Signed)
Addended by: Marian Sorrow on: 08/20/2021 12:24 PM   Modules accepted: Orders

## 2021-08-20 NOTE — Telephone Encounter (Signed)
Spoke to pt told her I did send 24 tablets. Pt said the directions are wrong it should say 1-2 tablets every week. Told her sorry I will correct directions and send now. Pt verbalized understanding.

## 2021-09-06 ENCOUNTER — Ambulatory Visit: Payer: PPO | Admitting: Gastroenterology

## 2021-09-27 ENCOUNTER — Other Ambulatory Visit: Payer: Self-pay

## 2021-09-27 ENCOUNTER — Encounter: Payer: Self-pay | Admitting: Gastroenterology

## 2021-09-27 ENCOUNTER — Ambulatory Visit: Payer: PPO | Admitting: Gastroenterology

## 2021-09-27 VITALS — BP 120/78 | HR 71 | Ht 63.25 in | Wt 125.0 lb

## 2021-09-27 DIAGNOSIS — R1310 Dysphagia, unspecified: Secondary | ICD-10-CM | POA: Diagnosis not present

## 2021-09-27 DIAGNOSIS — K219 Gastro-esophageal reflux disease without esophagitis: Secondary | ICD-10-CM | POA: Diagnosis not present

## 2021-09-27 MED ORDER — FAMOTIDINE 40 MG PO TABS: 40.0000 mg | ORAL_TABLET | Freq: Two times a day (BID) | ORAL | 5 refills | Status: DC

## 2021-09-27 NOTE — Patient Instructions (Signed)
We have sent the following medications to your pharmacy for you to pick up at your convenience: Pepcid 40 mg twice daily.  You have been scheduled for an endoscopy. Please follow written instructions given to you at your visit today. If you use inhalers (even only as needed), please bring them with you on the day of your procedure.  If you are age 69 or older, your body mass index should be between 23-30. Your Body mass index is 21.97 kg/m. If this is out of the aforementioned range listed, please consider follow up with your Primary Care Provider.  If you are age 41 or younger, your body mass index should be between 19-25. Your Body mass index is 21.97 kg/m. If this is out of the aformentioned range listed, please consider follow up with your Primary Care Provider.   ________________________________________________________  The [facility] providers would like to encourage you to use [facility] to communicate with providers for non-urgent requests or questions.  Due to long hold times on the telephone, sending your provider a message by [facility] may be a faster and more efficient way to get a response.  Please allow 48 business hours for a response.  Please remember that this is for non-urgent requests.  _______________________________________________________

## 2021-09-27 NOTE — Progress Notes (Signed)
09/27/2021 ADELHEID HOGGARD [id number] 06-28-1951   HISTORY OF PRESENT ILLNESS: This is a 71 year old female who is new to our office.  She used to follow with Dr. Earlean Shawl, but now he has retired.  She has been trying to see Dr. Tarri Glenn, has had 2 appointments with her that have been canceled/rescheduled so now she is here to see me today.  She has concerns of reflux and the consequence that this reflux may have had in her esophagus over the years.  She tells me that remotely she was treated with Protonix for quite some time several years ago.  Now over the past several years she has not been on any medication for her acid reflux, but does feel like her symptoms have crept back up again over the last few years.  She gets hoarseness and raspy throat, sometimes what she describes as mucus in the back of her throat.  She also describes sometimes liquids getting stuck and choking on them, but not having really any issues with solid foods.  She has osteoporosis, but is not being treated for that because she cannot take any of the oral medication with her complaints of dysphagia and she has a jaw issue so they would not give her any in the injectables due to risk of avascular necrosis of the jaw I believe.  Nonetheless, she would like to get that checked out.  She has had colonoscopies with Dr. Earlean Shawl, the last in November 2018 at which time the study was normal and he recommended a repeat in 10 years.  She says that she has never had polyps on any of her colonoscopies.  She has complained of intermittent rectal pain over the years that he told her was intermittent rectal spasms.  Says typically will wake her up in the middle the night and be extremely painful, but relieved within 10 minutes time.  Improves with child's pose yoga position.  She has also been experiencing a pressure sensation in her rectum recently.  She thinks that she pulled a muscle in her pelvic floor when she was lifting something and  actually already has a referral to pelvic floor physical therapy.  She denies any rectal bleeding.  No changes in her bowel habits, etc.   Past Medical History:  Diagnosis Date   GERD (gastroesophageal reflux disease)    Hyperlipidemia    Past Surgical History:  Procedure Laterality Date   ABDOMINAL HYSTERECTOMY     APPENDECTOMY  2006   disc ectomy     pheumothorax      PLEURAL SCARIFICATION     TONSILECTOMY/ADENOIDECTOMY WITH MYRINGOTOMY      reports that she quit smoking about 25 years ago. Her smoking use included cigarettes. She has never used smokeless tobacco. She reports that she does not drink alcohol and does not use drugs. family history includes Breast cancer in her mother; Cancer in her father, maternal grandmother, mother, paternal grandfather, and paternal grandmother; Esophageal cancer in her maternal aunt; Heart disease in her mother; Hyperlipidemia in her brother; Hypertension in her brother; Mental illness in her brother. No Known Allergies    Outpatient Encounter Medications as of 09/27/2021  Medication Sig   aspirin 81 MG tablet Take 81 mg by mouth daily.   BIOTIN PO Take by mouth.   Calcium Carbonate-Vitamin D (CALTRATE 600+D PO) Take by mouth daily.    doxycycline (ADOXA) 50 MG tablet Take 50 mg by mouth. Takes twice a week   Estradiol (YUVAFEM) 10  MCG TABS vaginal tablet Place 1-2 tablets (10-20 mcg total) vaginally once a week.   metroNIDAZOLE (METROCREAM) 0.75 % cream Apply 1 application topically daily in the afternoon.   Misc Natural Products (GLUCOSAMINE CHOND COMPLEX/MSM) TABS Take 1 each by mouth daily.   Multiple Vitamin (MULTIVITAMIN) capsule Take 1 capsule by mouth daily.   Omega-3 Fatty Acids (FISH OIL) 1000 MG CAPS Take 2 each by mouth in the morning and at bedtime.   rosuvastatin (CRESTOR) 5 MG tablet Take 1 tablet (5 mg total) by mouth daily.   No facility-administered encounter medications on file as of 09/27/2021.     REVIEW OF SYSTEMS  : All  other systems reviewed and negative except where noted in the History of Present Illness.   PHYSICAL EXAM: BP 120/78    Pulse 71    Ht 5' 3.25" (1.607 m)    Wt 125 lb (56.7 kg)    SpO2 99%    BMI 21.97 kg/m  General: Well developed white female in no acute distress Head: Normocephalic and atraumatic Eyes:  Sclerae anicteric, conjunctiva pink. Ears: Normal auditory acuity Lungs: Clear throughout to auscultation; no W/R/R. Heart: Regular rate and rhythm; no M/R/G. Abdomen: Soft, non-distended.  BS present.  Non-tender. Rectal: No external abnormalities noted.  DRE revealed small amount of soft stool in the rectum, but no masses noted.  No tenderness. Musculoskeletal: Symmetrical with no gross deformities  Skin: No lesions on visible extremities Extremities: No edema  Neurological: Alert oriented x 4, grossly non-focal Psychological:  Alert and cooperative. Normal mood and affect  ASSESSMENT AND PLAN: *GERD, dysphagia: Had been on PPI in the form of Protonix for a long time several years ago.  Has not been on anything as of late, but definitely feels like her acid reflux has been an issue again.  Sometimes she feels like she chokes on liquids, but no issues with swallowing solid foods.  Has osteoporosis and is not currently being treated for that as they will not give her the oral medications with her complaints of dysphagia.  Probably need to avoid PPI in her for now.  We will place her on Pepcid 40 mg twice daily.  Prescription sent to pharmacy.  We will plan for EGD early next week with Dr. Tarri Glenn to evaluate for esophagitis, stricture, etc.  The risks, benefits, and alternatives to EGD were discussed with the patient and she consents to proceed.  *Rectal pressure/rectal spasm:  Rectal spasms occurring intermittently for years.  Colonoscopies have always been normal, last 07/2017.  No abnormalities on DRE today.  She already has a referral to pelvic floor PT and that may help these other  issues as well.   CC:  [name], [place]

## 2021-09-29 ENCOUNTER — Encounter: Payer: Self-pay | Admitting: Certified Registered Nurse Anesthetist

## 2021-09-30 ENCOUNTER — Ambulatory Visit: Payer: PPO | Admitting: Gastroenterology

## 2021-09-30 ENCOUNTER — Encounter: Payer: Self-pay | Admitting: Gastroenterology

## 2021-09-30 ENCOUNTER — Other Ambulatory Visit: Payer: Self-pay

## 2021-09-30 VITALS — BP 143/75 | HR 62 | Temp 97.7°F | Resp 16 | Ht 63.0 in | Wt 125.0 lb

## 2021-09-30 DIAGNOSIS — R1310 Dysphagia, unspecified: Secondary | ICD-10-CM

## 2021-09-30 DIAGNOSIS — K219 Gastro-esophageal reflux disease without esophagitis: Secondary | ICD-10-CM

## 2021-09-30 MED ORDER — SODIUM CHLORIDE 0.9 % IV SOLN: 500.0000 mL | INTRAVENOUS | Status: AC

## 2021-09-30 NOTE — Progress Notes (Signed)
Patient came into recovery and EGD was not done due to patient having a difficult airway. Patient was not sedated. IV removed and patient taken off the monitor.  Patient dressed and walked out on her own.

## 2021-09-30 NOTE — Progress Notes (Signed)
Referring Provider: Inda Coke, PA Primary Care Physician:  Inda Coke, PA  Reason for Procedure: GERD, dysphagia   IMPRESSION:  GERD, Dysphagia History of difficult airway  PLAN: EGD will not be performed today and needs to be rescheduled at the hospital  HPI: [name] is a 71 y.o. female presents for upper endoscopy to evaluate GERD and dysphagia. Please see 09/27/21 office for complete details. There has been no significant change in history of PE   Past Medical History:  Diagnosis Date   GERD (gastroesophageal reflux disease)    Hyperlipidemia     Past Surgical History:  Procedure Laterality Date   ABDOMINAL HYSTERECTOMY     APPENDECTOMY  2006   disc ectomy     pheumothorax      PLEURAL SCARIFICATION     TONSILECTOMY/ADENOIDECTOMY WITH MYRINGOTOMY      Current Outpatient Medications  Medication Sig Dispense Refill   aspirin 81 MG tablet Take 81 mg by mouth daily.     BIOTIN PO Take by mouth.     Calcium Carbonate-Vitamin D (CALTRATE 600+D PO) Take by mouth daily.      Estradiol (YUVAFEM) 10 MCG TABS vaginal tablet Place 1-2 tablets (10-20 mcg total) vaginally once a week. 24 tablet 1   metroNIDAZOLE (METROCREAM) 0.75 % cream Apply 1 application topically daily in the afternoon.     Misc Natural Products (GLUCOSAMINE CHOND COMPLEX/MSM) TABS Take 1 each by mouth daily.     Multiple Vitamin (MULTIVITAMIN) capsule Take 1 capsule by mouth daily.     Omega-3 Fatty Acids (FISH OIL) 1000 MG CAPS Take 2 each by mouth in the morning and at bedtime.     rosuvastatin (CRESTOR) 5 MG tablet Take 1 tablet (5 mg total) by mouth daily. 90 tablet 3   doxycycline (ADOXA) 50 MG tablet Take 50 mg by mouth. Takes twice a week (Patient not taking: Reported on 09/30/2021)     famotidine (PEPCID) 40 MG tablet Take 1 tablet (40 mg total) by mouth 2 (two) times daily. 60 tablet 5   Current Facility-Administered Medications  Medication Dose Route Frequency Provider Last  Rate Last Admin   0.9 %  sodium chloride infusion  500 mL Intravenous Continuous [place] [facility], MD        Allergies as of 09/30/2021   (No Known Allergies)    Family History  Problem Relation Age of Onset   Heart disease Mother    Cancer Mother        breast cancer   Breast cancer Mother    Cancer Father        myo dysplasia   Hyperlipidemia Brother    Hypertension Brother    Mental illness Brother    Esophageal cancer Maternal Aunt    Cancer Maternal Grandmother        cervical or uterine   Cancer Paternal Grandmother        ovarian   Cancer Paternal Grandfather        prostate   Colon cancer Neg Hx    Pancreatic cancer Neg Hx      Physical Exam: General:   Alert,  well-nourished, pleasant and cooperative in NAD Head:  Normocephalic and atraumatic. Eyes:  Sclera clear, no icterus.   Conjunctiva pink. Mouth:  No deformity or lesions.   Neck:  Supple; no masses or thyromegaly. Lungs:  Clear throughout to auscultation.   No wheezes. Heart:  Regular rate and rhythm; no murmurs. Abdomen:  Soft, non-tender, nondistended, normal bowel  sounds, no rebound or guarding.  Msk:  Symmetrical. No boney deformities LAD: No inguinal or umbilical LAD Extremities:  No clubbing or edema. Neurologic:  Alert and  oriented x4;  grossly nonfocal Skin:  No obvious rash or bruise. Psych:  Alert and cooperative. Normal mood and affect.     Studies/Results: No results found.    Marbeth Smedley L. Tarri Glenn, MD, MPH 09/30/2021, 8:11 AM

## 2021-09-30 NOTE — Progress Notes (Signed)
0801 Robinul 0.1 mg IV given due large amount of secretions upon assessment.  MD made aware, vss  

## 2021-09-30 NOTE — Progress Notes (Signed)
Pt let us know that she has a difficult airway, case is canceled.

## 2021-09-30 NOTE — Progress Notes (Signed)
Pt's states no medical or surgical changes since previsit or office visit. 

## 2021-10-03 NOTE — Progress Notes (Signed)
Reviewed and agree with management plans. ? ?Kristi Mochizuki L. Takeya Marquis, MD, MPH  ?

## 2021-10-14 ENCOUNTER — Other Ambulatory Visit: Payer: Self-pay

## 2021-10-14 ENCOUNTER — Emergency Department (HOSPITAL_BASED_OUTPATIENT_CLINIC_OR_DEPARTMENT_OTHER)
Admission: EM | Admit: 2021-10-14 | Discharge: 2021-10-14 | Disposition: A | Discharge status: Home / Self Care | Payer: PPO | Attending: Emergency Medicine | Admitting: Emergency Medicine

## 2021-10-14 ENCOUNTER — Emergency Department (HOSPITAL_BASED_OUTPATIENT_CLINIC_OR_DEPARTMENT_OTHER): Payer: PPO

## 2021-10-14 ENCOUNTER — Encounter (HOSPITAL_BASED_OUTPATIENT_CLINIC_OR_DEPARTMENT_OTHER): Payer: Self-pay

## 2021-10-14 DIAGNOSIS — M79605 Pain in left leg: Secondary | ICD-10-CM | POA: Diagnosis not present

## 2021-10-14 DIAGNOSIS — M79662 Pain in left lower leg: Secondary | ICD-10-CM | POA: Diagnosis not present

## 2021-10-14 DIAGNOSIS — Z7982 Long term (current) use of aspirin: Secondary | ICD-10-CM | POA: Insufficient documentation

## 2021-10-14 NOTE — ED Provider Notes (Signed)
[facility] DEPT Provider Note   CSN: [id number] Arrival date & time: 10/14/21  1841     History  Chief Complaint  Patient presents with   Leg Pain    [name] is a 71 y.o. female.  71 year old female presents with left calf pain.  States that she has had this for 1 day.  Pain characterizes sharp in her mid calf.  Denies history of trauma.  No chest pain or shortness of breath.  No distal numbness or tingling to her left foot.  States that at times it feels like a spasm.  No treatment use prior to arrival      Home Medications Prior to Admission medications   Medication Sig Start Date End Date Taking? Authorizing Provider  aspirin 81 MG tablet Take 81 mg by mouth daily.    [provider]  BIOTIN PO Take by mouth.    [provider]  Calcium Carbonate-Vitamin D (CALTRATE 600+D PO) Take by mouth daily.     [provider]  doxycycline (ADOXA) 50 MG tablet Take 50 mg by mouth. Takes twice a week Patient not taking: Reported on 09/30/2021    [provider]  Estradiol (YUVAFEM) 10 MCG TABS vaginal tablet Place 1-2 tablets (10-20 mcg total) vaginally once a week. 08/20/21   Inda Coke, PA  famotidine (PEPCID) 40 MG tablet Take 1 tablet (40 mg total) by mouth 2 (two) times daily. 09/27/21   Zehr, Laban Emperor, PA-C  metroNIDAZOLE (METROCREAM) 0.75 % cream Apply 1 application topically daily in the afternoon. 12/20/19   [provider]  Misc Natural Products (GLUCOSAMINE CHOND COMPLEX/MSM) TABS Take 1 each by mouth daily. 03/22/18   [provider]  Multiple Vitamin (MULTIVITAMIN) capsule Take 1 capsule by mouth daily.    [provider]  Omega-3 Fatty Acids (FISH OIL) 1000 MG CAPS Take 2 each by mouth in the morning and at bedtime.    [provider]  rosuvastatin (CRESTOR) 5 MG tablet Take 1 tablet (5 mg total) by mouth daily. 06/11/21   Inda Coke, PA      Allergies    Patient  has no known allergies.    Review of Systems   Review of Systems  All other systems reviewed and are negative.  Physical Exam Updated Vital Signs BP (!) 121/108 (BP Location: Right Arm)    Pulse 77    Temp 98.6 F (37 C)    Resp 18    Ht 1.6 m (5\' 3" )    Wt 56.7 kg    SpO2 98%    BMI 22.14 kg/m  Physical Exam Vitals and nursing note reviewed.  Constitutional:      General: She is not in acute distress.    Appearance: Normal appearance. She is well-developed. She is not toxic-appearing.  HENT:     Head: Normocephalic and atraumatic.  Eyes:     General: Lids are normal.     Conjunctiva/sclera: Conjunctivae normal.     Pupils: Pupils are equal, round, and reactive to light.  Neck:     Thyroid: No thyroid mass.     Trachea: No tracheal deviation.  Cardiovascular:     Rate and Rhythm: Normal rate and regular rhythm.     Heart sounds: Normal heart sounds. No murmur heard.   No gallop.  Pulmonary:     Effort: Pulmonary effort is normal. No respiratory distress.     Breath sounds: Normal breath sounds. No stridor. No decreased breath  sounds, wheezing, rhonchi or rales.  Abdominal:     General: There is no distension.     Palpations: Abdomen is soft.     Tenderness: There is no abdominal tenderness. There is no rebound.  Musculoskeletal:        General: No tenderness. Normal range of motion.     Cervical back: Normal range of motion and neck supple.       Legs:     Comments: Compartment soft.  No signs of infection.  Skin:    General: Skin is warm and dry.     Findings: No abrasion or rash.  Neurological:     Mental Status: She is alert and oriented to person, place, and time. Mental status is at baseline.     GCS: GCS eye subscore is 4. GCS verbal subscore is 5. GCS motor subscore is 6.     Cranial Nerves: No cranial nerve deficit.     Sensory: No sensory deficit.     Motor: Motor function is intact.  Psychiatric:        Attention and Perception: Attention normal.         Speech: Speech normal.        Behavior: Behavior normal.    ED Results / Procedures / Treatments   Labs (all labs ordered are listed, but only abnormal results are displayed) Labs Reviewed - No data to display  EKG None  Radiology US Venous Img Lower Unilateral Left  Result Date: 10/14/2021 CLINICAL DATA:  Left lower extremity pain. EXAM: Left LOWER EXTREMITY VENOUS DOPPLER ULTRASOUND TECHNIQUE: Gray-scale sonography with compression, as well as color and duplex ultrasound, were performed to evaluate the deep venous system(s) from the level of the common femoral vein through the popliteal and proximal calf veins. COMPARISON:  None. FINDINGS: VENOUS Normal compressibility of the common femoral, superficial femoral, and popliteal veins, as well as the visualized calf veins. Visualized portions of profunda femoral vein and great saphenous vein unremarkable. No filling defects to suggest DVT on grayscale or color Doppler imaging. Doppler waveforms show normal direction of venous flow, normal respiratory plasticity and response to augmentation. Limited views of the contralateral common femoral vein are unremarkable. OTHER None. Limitations: none IMPRESSION: Negative. Electronically Signed   By: Anner Crete M.D.   On: 10/14/2021 21:22    Procedures Procedures    Medications Ordered in ED Medications - No data to display  ED Course/ Medical Decision Making/ A&P                           Medical Decision Making Amount and/or Complexity of Data Reviewed ECG/medicine tests: ordered.  Patient seen with spouse in room. Lower extremity Doppler negative for DVT.  Suspect musculoskeletal etiology.  No evidence of vascular emergency.  Will discharge        Final Clinical Impression(s) / ED Diagnoses Final diagnoses:  None    Rx / DC Orders ED Discharge Orders     None         Lacretia Leigh, MD 10/14/21 2242

## 2021-10-14 NOTE — ED Triage Notes (Signed)
Patient here POV from Home for Leg Pain.  Patient endorses Lower Left Leg Pain that began this Afternoon. Patient does endorse Long Times of Sitting yesterday in car/plane.  81 mg ASA daily.  NAD noted during Triage. A&Ox4. GCS 15. Ambulatory.

## 2021-10-14 NOTE — ED Notes (Signed)
EMT-P provided AVS using Teachback Method. Patient verbalizes understanding of Discharge Instructions. Opportunity for Questioning and Answers were provided by EMT-P. Patient Discharged from ED.  ? ?

## 2021-10-14 NOTE — Discharge Instructions (Signed)
Use Tylenol or Motrin as directed for pain.  Return here for any problems

## 2021-10-15 ENCOUNTER — Encounter: Payer: Self-pay | Admitting: Physician Assistant

## 2021-10-15 ENCOUNTER — Telehealth (INDEPENDENT_AMBULATORY_CARE_PROVIDER_SITE_OTHER): Payer: PPO | Admitting: Physician Assistant

## 2021-10-15 VITALS — Ht 63.0 in | Wt 124.0 lb

## 2021-10-15 DIAGNOSIS — F40243 Fear of flying: Secondary | ICD-10-CM

## 2021-10-15 DIAGNOSIS — T884XXA Failed or difficult intubation, initial encounter: Secondary | ICD-10-CM | POA: Insufficient documentation

## 2021-10-15 MED ORDER — DIAZEPAM 5 MG PO TABS: 5.0000 mg | ORAL_TABLET | Freq: Two times a day (BID) | ORAL | 0 refills | Status: DC | PRN

## 2021-10-15 NOTE — Telephone Encounter (Signed)
Patient is scheduled   

## 2021-10-15 NOTE — Progress Notes (Signed)
I acted as a Education administrator for [employer], PA-C Anselmo Pickler, LPN  Virtual Visit via Video Note   I, , connected with  [name]  ([id number], 13-Jun-1951) on 10/15/21 at  4:00 PM EST by a video-enabled telemedicine application and verified that I am speaking with the correct person using two identifiers.  Location: Patient: Home Provider: [place] office   I discussed the limitations of evaluation and management by telemedicine and the availability of in person appointments. The patient expressed understanding and agreed to proceed.    History of Present Illness: [name] is a 71 y.o. who identifies as a female who was assigned female at birth, and is being seen today for Anxiety with flying.   Pt is flying to [place] this week. She is requesting valium for her trip. She has history of flight anxiety and has used this in the past with good results. Denies: severe anxiety, depression. She is overall coping well with the recent loss of her sister-in-law.  Problems:  Patient Active Problem List   Diagnosis Date Noted   Difficult intubation 10/15/2021   Gastroesophageal reflux disease 09/27/2021   Dysphagia 09/27/2021   Endometriosis 06/04/2021   Atrophic vaginitis 06/04/2021   Reduced libido 06/04/2021   Hyperlipidemia, mild 12/03/2020   Trigger thumb, left thumb 11/09/2019   Stress incontinence 06/30/2018   S/P total abdominal hysterectomy 10/03/2016   Abnormal mammogram 02/05/2016   Osteoporosis without current pathological fracture 12/13/2015   Nonspecific abnormal electrocardiogram (ECG) (EKG) 09/27/2013   Collagenous colitis 09/23/2003    Allergies: No Known Allergies Medications:  Current Outpatient Medications:    aspirin 81 MG tablet, Take 81 mg by mouth daily., Disp: , Rfl:    BIOTIN PO, Take by mouth., Disp: , Rfl:    Calcium Carbonate-Vitamin D (CALTRATE 600+D PO), Take by mouth daily. , Disp: , Rfl:    diazepam (VALIUM) 5 MG tablet, Take  1 tablet (5 mg total) by mouth every 12 (twelve) hours as needed for anxiety., Disp: 10 tablet, Rfl: 0   doxycycline (ADOXA) 50 MG tablet, Take 50 mg by mouth. Takes twice a week, Disp: , Rfl:    Estradiol (YUVAFEM) 10 MCG TABS vaginal tablet, Place 1-2 tablets (10-20 mcg total) vaginally once a week., Disp: 24 tablet, Rfl: 1   metroNIDAZOLE (METROCREAM) 0.75 % cream, Apply 1 application topically daily in the afternoon., Disp: , Rfl:    Misc Natural Products (GLUCOSAMINE CHOND COMPLEX/MSM) TABS, Take 1 each by mouth daily., Disp: , Rfl:    Multiple Vitamin (MULTIVITAMIN) capsule, Take 1 capsule by mouth daily., Disp: , Rfl:    Omega-3 Fatty Acids (FISH OIL) 1000 MG CAPS, Take 2 each by mouth in the morning and at bedtime., Disp: , Rfl:    rosuvastatin (CRESTOR) 5 MG tablet, Take 1 tablet (5 mg total) by mouth daily., Disp: 90 tablet, Rfl: 3   famotidine (PEPCID) 40 MG tablet, Take 1 tablet (40 mg total) by mouth 2 (two) times daily. (Patient not taking: Reported on 10/15/2021), Disp: 60 tablet, Rfl: 5  Current Facility-Administered Medications:    0.9 %  sodium chloride infusion, 500 mL, Intravenous, Continuous, Beavers, Kimberly, MD  Observations/Objective: Patient is well-developed, well-nourished in no acute distress.  Resting comfortably  at home.  Head is normocephalic, atraumatic.  No labored breathing.  Speech is clear and coherent with logical content.  Patient is alert and oriented at baseline.    Assessment and Plan: 1. Anxiety with flying Uncontrolled  New issue to me Valium 5 mg prn for flights Follow-up if this does not provide relief for flight or any other concerns  Follow Up Instructions: I discussed the assessment and treatment plan with the patient. The patient was provided an opportunity to ask questions and all were answered. The patient agreed with the plan and demonstrated an understanding of the instructions.  A copy of instructions were sent to the patient via  MyChart unless otherwise noted below.   The patient was advised to call back or seek an in-person evaluation if the symptoms worsen or if the condition fails to improve as anticipated.  Inda Coke, [place]

## 2021-10-16 DIAGNOSIS — Z20822 Contact with and (suspected) exposure to covid-19: Secondary | ICD-10-CM | POA: Diagnosis not present

## 2021-10-18 DIAGNOSIS — U071 COVID-19: Secondary | ICD-10-CM | POA: Diagnosis not present

## 2021-10-18 DIAGNOSIS — R0982 Postnasal drip: Secondary | ICD-10-CM | POA: Diagnosis not present

## 2021-10-28 DIAGNOSIS — R062 Wheezing: Secondary | ICD-10-CM | POA: Diagnosis not present

## 2021-10-28 DIAGNOSIS — H6991 Unspecified Eustachian tube disorder, right ear: Secondary | ICD-10-CM | POA: Diagnosis not present

## 2021-10-28 DIAGNOSIS — R0602 Shortness of breath: Secondary | ICD-10-CM | POA: Diagnosis not present

## 2021-10-28 DIAGNOSIS — Z6821 Body mass index (BMI) 21.0-21.9, adult: Secondary | ICD-10-CM | POA: Diagnosis not present

## 2021-10-28 DIAGNOSIS — Z20822 Contact with and (suspected) exposure to covid-19: Secondary | ICD-10-CM | POA: Diagnosis not present

## 2021-10-28 DIAGNOSIS — U071 COVID-19: Secondary | ICD-10-CM | POA: Diagnosis not present

## 2021-11-24 NOTE — Anesthesia Preprocedure Evaluation (Addendum)
Anesthesia Evaluation  ?Patient identified by MRN, date of birth, ID band ?Patient awake ? ? ? ?Reviewed: ?Allergy & Precautions, NPO status , Patient's Chart, lab work & pertinent test results ? ?History of Anesthesia Complications ?(+) DIFFICULT AIRWAY and history of anesthetic complications (Hx of difficult intubation at [facility] with multiple anesthesiologist attempts with various instruments including video laryngoscopes and fiberoptic scope, able to place ETT with [place] per EMR) ? ?Airway ?Mallampati: II ? ?TM Distance: >3 FB ?Neck ROM: Full ? ? ? Dental ?no notable dental hx. ? ?  ?Pulmonary ?neg pulmonary ROS, former smoker,  ?  ?Pulmonary exam normal ? ? ? ? ? ? ? Cardiovascular ?negative cardio ROS ?Normal cardiovascular exam ? ? ?  ?Neuro/Psych ?S/P ACDF ?  ? GI/Hepatic ?Neg liver ROS, GERD  Medicated and Controlled,  ?Endo/Other  ?negative endocrine ROS ? Renal/GU ?negative Renal ROS  ?negative genitourinary ?  ?Musculoskeletal ?negative musculoskeletal ROS ?(+)  ? Abdominal ?  ?Peds ? Hematology ?negative hematology ROS ?(+)   ?Anesthesia Other Findings ?Day of surgery medications reviewed with patient. ? Reproductive/Obstetrics ?negative OB ROS ? ?  ? ? ? ? ? ? ? ? ? ? ? ? ? ?  ?  ? ? ? ? ? ? ? ?Anesthesia Physical ?Anesthesia Plan ? ?ASA: 2 ? ?Anesthesia Plan: MAC  ? ?Post-op Pain Management: Minimal or no pain anticipated  ? ?Induction:  ? ?PONV Risk Score and Plan: 2 and Treatment may vary due to age or medical condition and Propofol infusion ? ?Airway Management Planned: Natural Airway and Nasal Cannula ? ?Additional Equipment: None ? ?Intra-op Plan:  ? ?Post-operative Plan:  ? ?Informed Consent: I have reviewed the patients History and Physical, chart, labs and discussed the procedure including the risks, benefits and alternatives for the proposed anesthesia with the patient or authorized representative who has indicated his/her understanding and acceptance.   ? ? ? ? ? ?Plan Discussed with: CRNA ? ?Anesthesia Plan Comments:   ? ? ? ? ? ?Anesthesia Quick Evaluation ? ?

## 2021-11-25 ENCOUNTER — Encounter (HOSPITAL_COMMUNITY): Payer: Self-pay | Admitting: Gastroenterology

## 2021-11-25 ENCOUNTER — Ambulatory Visit (HOSPITAL_COMMUNITY): Payer: PPO | Admitting: Anesthesiology

## 2021-11-25 ENCOUNTER — Ambulatory Visit (HOSPITAL_COMMUNITY)
Admission: RE | Admit: 2021-11-25 | Discharge: 2021-11-25 | Disposition: A | Discharge status: Home / Self Care | Payer: PPO | Attending: Gastroenterology | Admitting: Gastroenterology

## 2021-11-25 ENCOUNTER — Ambulatory Visit (HOSPITAL_BASED_OUTPATIENT_CLINIC_OR_DEPARTMENT_OTHER): Payer: PPO | Admitting: Anesthesiology

## 2021-11-25 ENCOUNTER — Encounter (HOSPITAL_COMMUNITY)
Admission: RE | Disposition: A | Discharge status: Home / Self Care | Payer: Self-pay | Source: Home / Self Care | Attending: Gastroenterology

## 2021-11-25 ENCOUNTER — Other Ambulatory Visit: Payer: Self-pay

## 2021-11-25 DIAGNOSIS — K3189 Other diseases of stomach and duodenum: Secondary | ICD-10-CM

## 2021-11-25 DIAGNOSIS — K219 Gastro-esophageal reflux disease without esophagitis: Secondary | ICD-10-CM | POA: Insufficient documentation

## 2021-11-25 DIAGNOSIS — Z87891 Personal history of nicotine dependence: Secondary | ICD-10-CM | POA: Insufficient documentation

## 2021-11-25 DIAGNOSIS — K2950 Unspecified chronic gastritis without bleeding: Secondary | ICD-10-CM | POA: Diagnosis not present

## 2021-11-25 DIAGNOSIS — K259 Gastric ulcer, unspecified as acute or chronic, without hemorrhage or perforation: Secondary | ICD-10-CM | POA: Insufficient documentation

## 2021-11-25 DIAGNOSIS — K317 Polyp of stomach and duodenum: Secondary | ICD-10-CM | POA: Diagnosis not present

## 2021-11-25 DIAGNOSIS — K31A15 Gastric intestinal metaplasia without dysplasia, involving multiple sites: Secondary | ICD-10-CM | POA: Insufficient documentation

## 2021-11-25 DIAGNOSIS — R1310 Dysphagia, unspecified: Secondary | ICD-10-CM | POA: Diagnosis not present

## 2021-11-25 DIAGNOSIS — K253 Acute gastric ulcer without hemorrhage or perforation: Secondary | ICD-10-CM

## 2021-11-25 HISTORY — PX: BALLOON DILATION: SHX5330

## 2021-11-25 HISTORY — PX: ESOPHAGOGASTRODUODENOSCOPY (EGD) WITH PROPOFOL: SHX5813

## 2021-11-25 HISTORY — PX: BIOPSY: SHX5522

## 2021-11-25 SURGERY — ESOPHAGOGASTRODUODENOSCOPY (EGD) WITH PROPOFOL
Anesthesia: Monitor Anesthesia Care

## 2021-11-25 MED ORDER — PROPOFOL 10 MG/ML IV BOLUS
INTRAVENOUS | Status: DC | PRN
Start: 2021-11-25 — End: 2021-11-25
  Administered 2021-11-25: 10 mg via INTRAVENOUS
  Administered 2021-11-25: 20 mg via INTRAVENOUS
  Administered 2021-11-25: 10 mg via INTRAVENOUS
  Administered 2021-11-25: 20 mg via INTRAVENOUS

## 2021-11-25 MED ORDER — LACTATED RINGERS IV SOLN: INTRAVENOUS | Status: DC

## 2021-11-25 MED ORDER — SODIUM CHLORIDE 0.9 % IV SOLN: INTRAVENOUS | Status: DC

## 2021-11-25 MED ORDER — PANTOPRAZOLE SODIUM 40 MG PO TBEC: 40.0000 mg | DELAYED_RELEASE_TABLET | Freq: Two times a day (BID) | ORAL | 3 refills | Status: DC

## 2021-11-25 MED ORDER — LACTATED RINGERS IV SOLN
INTRAVENOUS | Status: AC | PRN
Start: 2021-11-25 — End: 2021-11-25
  Administered 2021-11-25: 1000 mL via INTRAVENOUS

## 2021-11-25 MED ORDER — PROPOFOL 500 MG/50ML IV EMUL
INTRAVENOUS | Status: DC | PRN
  Administered 2021-11-25: 125 ug/kg/min via INTRAVENOUS

## 2021-11-25 SURGICAL SUPPLY — 15 items

## 2021-11-25 NOTE — H&P (Signed)
? ?  Referring Provider: No ref. provider found ?Primary Care Physician:  Inda Coke, PA ? ?Reason for Procedure:  GERD, dysphagia ? ? ?IMPRESSION:  ?GERD, dysphagia ?Appropriate candidate for monitored anesthesia care ? ?PLAN: ?EGD with possible dilation ? ? ?HPI: [name] is a 71 y.o. female presents for EGD. Recent GERD and dysphagia. Had been on PPI in the form of Protonix for a long time several years ago.  Has not been on anything as of late, but definitely feels like her acid reflux has been an issue again.  Sometimes she feels like she chokes on liquids, but no issues with swallowing solid foods.  Has osteoporosis and is not currently being treated for that as they will not give her the oral medications with her complaints of dysphagia. ? ?See Ludwig Clarks 09/27/21 office note for complete details. ? ? ? ?Past Medical History:  ?Diagnosis Date  ? GERD (gastroesophageal reflux disease)   ? Hyperlipidemia   ? ? ?Past Surgical History:  ?Procedure Laterality Date  ? ABDOMINAL HYSTERECTOMY    ? APPENDECTOMY  2006  ? disc ectomy    ? pheumothorax     ? PLEURAL SCARIFICATION    ? TONSILECTOMY/ADENOIDECTOMY WITH MYRINGOTOMY    ? ? ?Current Facility-Administered Medications  ?Medication Dose Route Frequency Provider Last Rate Last Admin  ? 0.9 %  sodium chloride infusion   Intravenous Continuous Thornton Park, MD      ? lactated ringers infusion   Intravenous Continuous Thornton Park, MD      ? lactated ringers infusion    Continuous PRN Thornton Park, MD 10 mL/hr at 11/25/21 0713 1,000 mL at 11/25/21 0713  ? ? ?Allergies as of 09/30/2021  ? (No Known Allergies)  ? ? ?Family History  ?Problem Relation Age of Onset  ? Heart disease Mother   ? Cancer Mother   ?     breast cancer  ? Breast cancer Mother   ? Cancer Father   ?     myo dysplasia  ? Hyperlipidemia Brother   ? Hypertension Brother   ? Mental illness Brother   ? Esophageal cancer Maternal Aunt   ? Cancer Maternal Grandmother   ?      cervical or uterine  ? Cancer Paternal Grandmother   ?     ovarian  ? Cancer Paternal Grandfather   ?     prostate  ? Colon cancer Neg Hx   ? Pancreatic cancer Neg Hx   ? ? ? ?Physical Exam: ?General:   Alert,  well-nourished, pleasant and cooperative in NAD ?Head:  Normocephalic and atraumatic. ?Eyes:  Sclera clear, no icterus.   Conjunctiva pink. ?Mouth:  No deformity or lesions.   ?Neck:  Supple; no masses or thyromegaly. ?Lungs:  Clear throughout to auscultation.   No wheezes. ?Heart:  Regular rate and rhythm; no murmurs. ?Abdomen:  Soft, non-tender, nondistended, normal bowel sounds, no rebound or guarding.  ?Msk:  Symmetrical. No boney deformities ?LAD: No inguinal or umbilical LAD ?Extremities:  No clubbing or edema. ?Neurologic:  Alert and  oriented x4;  grossly nonfocal ?Skin:  No obvious rash or bruise. ?Psych:  Alert and cooperative. Normal mood and affect. ? ? ? ? ?Studies/Results: ?No results found. ? ? ? ?Kristi Pullin L. Tarri Glenn, MD, MPH ?11/25/2021, 7:36 AM ? ? ? ?  ?

## 2021-11-25 NOTE — Anesthesia Procedure Notes (Signed)
Procedure Name: [place] ?Date/Time: 11/25/2021 7:48 AM ?Performed by: Niel Hummer, CRNA ?Pre-anesthesia Checklist: Patient identified, Emergency Drugs available, Suction available and Patient being monitored ?Oxygen Delivery Method: Simple face mask ? ? ? ? ?

## 2021-11-25 NOTE — Transfer of Care (Signed)
Immediate Anesthesia Transfer of Care Note ? ?Patient: AVIELLA DISBROW ? ?Procedure(s) Performed: ESOPHAGOGASTRODUODENOSCOPY (EGD) WITH PROPOFOL ?BALLOON DILATION ?BIOPSY ? ?Patient Location: PACU ? ?Anesthesia Type:General ? ?Level of Consciousness: awake, alert  and oriented ? ?Airway & Oxygen Therapy: Patient Spontanous Breathing and Patient connected to face mask oxygen ? ?Post-op Assessment: Report given to RN, Post -op Vital signs reviewed and stable and Patient moving all extremities X 4 ? ?Post vital signs: Reviewed and stable ? ?Last Vitals:  ?Vitals Value Taken Time  ?BP 143/106   ?Temp    ?Pulse 90 11/25/21 0813  ?Resp 22 11/25/21 0813  ?SpO2 100 % 11/25/21 0813  ?Vitals shown include unvalidated device data. ? ?Last Pain:  ?Vitals:  ? 11/25/21 0638  ?TempSrc: Oral  ?PainSc: 0-No pain  ?   ? ?  ? ?Complications: No notable events documented. ?

## 2021-11-25 NOTE — Anesthesia Postprocedure Evaluation (Signed)
Anesthesia Post Note ? ?Patient: AYRIS CARANO ? ?Procedure(s) Performed: ESOPHAGOGASTRODUODENOSCOPY (EGD) WITH PROPOFOL ?BALLOON DILATION ?BIOPSY ? ?  ? ?Patient location during evaluation: PACU ?Anesthesia Type: MAC ?Level of consciousness: awake and alert ?Pain management: pain level controlled ?Vital Signs Assessment: post-procedure vital signs reviewed and stable ?Respiratory status: spontaneous breathing, nonlabored ventilation and respiratory function stable ?Cardiovascular status: blood pressure returned to baseline ?Postop Assessment: no apparent nausea or vomiting ?Anesthetic complications: no ? ? ?No notable events documented. ? ?Last Vitals:  ?Vitals:  ? 11/25/21 0814 11/25/21 0820  ?BP: (!) 155/83 129/64  ?Pulse: 99 77  ?Resp: 18 16  ?Temp: 36.6 ?C   ?SpO2: 100% 99%  ?  ?Last Pain:  ?Vitals:  ? 11/25/21 0820  ?TempSrc:   ?PainSc: 0-No pain  ? ? ?  ?  ?  ?  ?  ?  ? ?Marthenia Rolling ? ? ? ? ?

## 2021-11-25 NOTE — Op Note (Signed)
[facility] ?Patient Name: [name] ?Procedure Date: 11/25/2021 ?MRN: [id number] ?Attending MD: Thornton Park MD, MD ?Date of Birth: July 25, 1951 ?CSN: [id number] ?Age: 71 ?Admit Type: Outpatient ?Procedure:                Upper GI endoscopy ?Indications:              Dysphagia, GERD ?Providers:                Thornton Park MD, MD, Allayne Gitelman, RN, Elberta Fortis  ?                          Johnella Moloney, Technician ?Referring MD:              ?Medicines:                Monitored Anesthesia Care ?Complications:            No immediate complications. Estimated blood loss:  ?                          Minimal. ?Estimated Blood Loss:     Estimated blood loss was minimal. ?Procedure:                Pre-Anesthesia Assessment: ?                          - Prior to the procedure, a History and Physical  ?                          was performed, and patient medications and  ?                          allergies were reviewed. The patient's tolerance of  ?                          previous anesthesia was also reviewed. The risks  ?                          and benefits of the procedure and the sedation  ?                          options and risks were discussed with the patient.  ?                          All questions were answered, and informed consent  ?                          was obtained. Prior Anticoagulants: The patient has  ?                          taken no previous anticoagulant or antiplatelet  ?                          agents. ASA Grade Assessment: II - A patient with  ?                          mild systemic  disease. After reviewing the risks  ?                          and benefits, the patient was deemed in  ?                          satisfactory condition to undergo the procedure. ?                          After obtaining informed consent, the endoscope was  ?                          passed under direct vision. Throughout the  ?                          procedure, the patient's blood  pressure, pulse, and  ?                          oxygen saturations were monitored continuously. The  ?                          [id number]-[id number] ([id number]) Olympus endoscope was introduced  ?                          through the mouth, and advanced to the third part  ?                          of duodenum. The upper GI endoscopy was  ?                          accomplished without difficulty. The patient  ?                          tolerated the procedure well. ?Scope In: ?Scope Out: ?Findings: ?     No endoscopic abnormality was evident in the esophagus to explain the  ?     patient's complaint of dysphagia. It was decided, however, to proceed  ?     with dilation in the distal esophagus. A TTS dilator was passed through  ?     the scope. Dilation with a 16-17-18 mm balloon dilator was performed to  ?     18 mm. The fully inflated balloon could be easily pulled through the  ?     esophagus. The dilation site was examined and showed no change.  ?     Estimated blood loss: none. After dilation, biopsies were taken with a  ?     cold forceps for histology from the mid/proximal and distal esophagus.  ?     Estimated blood loss was minimal. ?     Three small, non-bleeding cratered gastric ulcers with no stigmata of  ?     bleeding were found in the gastric body and in the gastric antrum. The  ?     largest lesion was 6 mm in largest dimension. Scattered erosions were  ?     also present in the antrum. Biopsies were taken from the antrum, body,  ?     and fundus  with a cold forceps for histology. Estimated blood loss was  ?     minimal. ?     A few small sessile polyps were found in the gastric fundus. Biopsies  ?     were taken with a cold forceps for histology. Estimated blood loss was  ?     minimal. ?     Patchy mildly erythematous mucosa without active bleeding and with no  ?     stigmata of bleeding was found in the duodenal bulb. ?Impression:               - No endoscopic esophageal abnormality to explain  ?                           patient's dysphagia. Esophagus dilated. Dilated.  ?                          Biopsied. ?                          - Non-bleeding gastric ulcers with no stigmata of  ?                          bleeding. Biopsied. ?                          - A few gastric polyps. Biopsied. ?                          - Erythematous duodenopathy. ?Moderate Sedation: ?     Not Applicable - Patient had care per Anesthesia. ?Recommendation:           - Patient has a contact number available for  ?                          emergencies. The signs and symptoms of potential  ?                          delayed complications were discussed with the  ?                          patient. Return to normal activities tomorrow.  ?                          Written discharge instructions were provided to the  ?                          patient. ?                          - Resume previous diet. ?                          - Continue present medications. ?                          - Pantoprazole 40 mg BID for at least 10 weeks,  ?  then reduce to 40 mg QAM. ?                          - Await pathology results. ?                          - No aspirin, ibuprofen, naproxen, or other  ?                          non-steroidal anti-inflammatory drugs. ?Procedure Code(s):        --- Professional --- ?                          [phone], 51, Esophagogastroduodenoscopy, flexible,  ?                          transoral; with transendoscopic balloon dilation of  ?                          esophagus (less than 30 mm diameter) ?Diagnosis Code(s):        --- Professional --- ?                          R13.10, Dysphagia, unspecified ?                          K25.9, Gastric ulcer, unspecified as acute or  ?                          chronic, without hemorrhage or perforation ?                          K31.7, Polyp of stomach and duodenum ?                          K31.89, Other diseases of stomach and duodenum ?CPT copyright 2019 American  Medical Association. All rights reserved. ?The codes documented in this report are preliminary and upon coder review may  ?be revised to meet current compliance requirements. ?Thornton Park MD, MD ?11/25/2021 8:19:06 AM ?This report has been signed electronically. ?Number of Addenda: 0 ?

## 2021-11-26 ENCOUNTER — Encounter (HOSPITAL_COMMUNITY): Payer: Self-pay | Admitting: Gastroenterology

## 2021-11-29 ENCOUNTER — Other Ambulatory Visit: Payer: Self-pay

## 2021-11-29 ENCOUNTER — Encounter: Payer: Self-pay | Admitting: Gastroenterology

## 2021-11-29 ENCOUNTER — Telehealth: Payer: Self-pay

## 2021-11-29 DIAGNOSIS — K219 Gastro-esophageal reflux disease without esophagitis: Secondary | ICD-10-CM

## 2021-11-29 LAB — SURGICAL PATHOLOGY

## 2021-11-29 MED ORDER — PANTOPRAZOLE SODIUM 40 MG PO TBEC: 40.0000 mg | DELAYED_RELEASE_TABLET | Freq: Every day | ORAL | 3 refills | Status: DC

## 2021-11-29 MED ORDER — PANTOPRAZOLE SODIUM 40 MG PO TBEC: 40.0000 mg | DELAYED_RELEASE_TABLET | Freq: Two times a day (BID) | ORAL | 0 refills | Status: DC

## 2021-11-29 NOTE — Telephone Encounter (Signed)
Per Dr. Tarri Glenn request, pt has been scheduled for 4-6 wk f/u on 01/01/22 @ 11am. Appt reminder has been mailed. Appt will reflect on AVS upon hosp discharge for pt future reference. ? ?

## 2021-11-29 NOTE — Progress Notes (Unsigned)
A. STOMACH, GASTRIC, ANTRUM, BODY, FUNDUS, BIOPSY:  - Localized intestinal metaplasia.  No inflammation, dysplasia, or  malignancy.   B. STOMACH, POLYPECTOMY:  - Patchy chronic gastritis.  No intestinal metaplasia, dysplasia, or  malignancy.  Warthin-Starry stain negative for Helicobacter pylori.   C. ESOPHAGUS, DISTAL, BIOPSY:  - Squamous mucosa fragments.  No inflammation, dysplasia, or malignancy.  - Detached fragment of gastric mucosa.  No inflammation, intestinal  metaplasia, dysplasia or malignancy.   D. ESOPHAGUS, MID/PROXIMAL, BIOPSY:  - Benign squamous mucosa.  No inflammation, metaplasia, dysplasia or  malignancy.  - Incidental fragment of benign smooth muscle.

## 2021-11-29 NOTE — Telephone Encounter (Signed)
-----   Message from Thornton Park, MD sent at 11/29/2021  1:21 PM EST ----- ?Office follow-up with Janett Billow in 4-6 weeks, Thanks. ? ?KLB ? ?

## 2021-12-09 NOTE — Progress Notes (Signed)
Letter sent via My Chart and mailed to home address. ? ?

## 2021-12-29 ENCOUNTER — Encounter: Payer: Self-pay | Admitting: Physician Assistant

## 2021-12-31 ENCOUNTER — Encounter: Payer: Self-pay | Admitting: Physician Assistant

## 2021-12-31 ENCOUNTER — Ambulatory Visit (INDEPENDENT_AMBULATORY_CARE_PROVIDER_SITE_OTHER): Payer: PPO | Admitting: Physician Assistant

## 2021-12-31 VITALS — BP 110/74 | HR 72 | Temp 98.4°F | Ht 63.0 in | Wt 125.8 lb

## 2021-12-31 DIAGNOSIS — E785 Hyperlipidemia, unspecified: Secondary | ICD-10-CM | POA: Diagnosis not present

## 2021-12-31 DIAGNOSIS — E559 Vitamin D deficiency, unspecified: Secondary | ICD-10-CM

## 2021-12-31 DIAGNOSIS — R432 Parageusia: Secondary | ICD-10-CM

## 2021-12-31 LAB — COMPREHENSIVE METABOLIC PANEL
ALT: 19 U/L (ref 0–35)
AST: 22 U/L (ref 0–37)
Albumin: 4.5 g/dL (ref 3.5–5.2)
Alkaline Phosphatase: 69 U/L (ref 39–117)
BUN: 14 mg/dL (ref 6–23)
CO2: 30 mEq/L (ref 19–32)
Calcium: 9.6 mg/dL (ref 8.4–10.5)
Chloride: 102 mEq/L (ref 96–112)
Creatinine, Ser: 0.72 mg/dL (ref 0.40–1.20)
GFR: 85.69 mL/min (ref >60.00)
Glucose, Bld: 83 mg/dL (ref 70–99)
Potassium: 4.2 mEq/L (ref 3.5–5.1)
Sodium: 139 mEq/L (ref 135–145)
Total Bilirubin: 0.4 mg/dL (ref 0.2–1.2)
Total Protein: 6.9 g/dL (ref 6.0–8.3)

## 2021-12-31 LAB — CBC WITH DIFFERENTIAL/PLATELET
Basophils Absolute: 0 10*3/uL (ref 0.0–0.1)
Basophils Relative: 0.6 % (ref 0.0–3.0)
Eosinophils Absolute: 0.2 10*3/uL (ref 0.0–0.7)
Eosinophils Relative: 2 % (ref 0.0–5.0)
HCT: 36 % (ref 36.0–46.0)
Hemoglobin: 12.4 g/dL (ref 12.0–15.0)
Lymphocytes Relative: 35 % (ref 12.0–46.0)
Lymphs Abs: 2.7 10*3/uL (ref 0.7–4.0)
MCHC: 34.3 g/dL (ref 30.0–36.0)
MCV: 82.9 fl (ref 78.0–100.0)
Monocytes Absolute: 0.7 10*3/uL (ref 0.1–1.0)
Monocytes Relative: 9.4 % (ref 3.0–12.0)
Neutro Abs: 4.1 10*3/uL (ref 1.4–7.7)
Neutrophils Relative %: 53 % (ref 43.0–77.0)
Platelets: 337 10*3/uL (ref 150.0–400.0)
RBC: 4.34 Mil/uL (ref 3.87–5.11)
RDW: 13.8 % (ref 11.5–15.5)
WBC: 7.8 10*3/uL (ref 4.0–10.5)

## 2021-12-31 LAB — TSH: TSH: 1.51 u[IU]/mL (ref 0.35–5.50)

## 2021-12-31 LAB — LIPID PANEL
Cholesterol: 164 mg/dL (ref 0–200)
HDL: 61.2 mg/dL (ref >39.00)
LDL Cholesterol: 85 mg/dL (ref 0–99)
NonHDL: 103.27
Total CHOL/HDL Ratio: 3
Triglycerides: 89 mg/dL (ref 0.0–149.0)
VLDL: 17.8 mg/dL (ref 0.0–40.0)

## 2021-12-31 LAB — VITAMIN D 25 HYDROXY (VIT D DEFICIENCY, FRACTURES): VITD: 41.88 ng/mL (ref 30.00–100.00)

## 2021-12-31 LAB — HEMOGLOBIN A1C: Hgb A1c MFr Bld: 5.9 % (ref 4.6–6.5)

## 2021-12-31 LAB — VITAMIN B12: Vitamin B-12: 681 pg/mL (ref 211–911)

## 2021-12-31 NOTE — Patient Instructions (Signed)
It was great to see you! ? ?I will be in touch via mychart with my recommendations! ? ?Take care, ? ?Inda Coke PA-C  ?

## 2021-12-31 NOTE — Progress Notes (Signed)
[name] is a 71 y.o. female here for a possible medication side effect. ? ?History of Present Illness:  ? ?Chief Complaint  ?Patient presents with  ? mouth concerns  ?  Pt stated that her symptoms started back in Nov and has gotten worse over time. The tongue feels like she has a coating on it with a metalic taste in her mouth. She feels like she is thirsty all the time but not sure if it is bc of what she is going thru. The only thing differently that she can think of is that she is on a statin medication.  ? ? ?HPI ? ?Dysgeusia ?[name] presents with c/o an oil like coating that has been in her mouth, mainly her tongue, for about 5-6 months. States that initially she thought she had eaten something that caused this, but due to it lasting for days at that time she became concerned. Reports that once she had COVID, it seemed that her sx exacerbated. Upon trying to describe the sensation, she compares it to to the a metallic taste in her mouth that is accompanied by the sensation of dry mouth despite having an adequate amount of saliva. Pt has recently visited the dentist for further evaluation and nothing remarkable was found. When trying to think of anything that could have caused this, she found that her sx started after she began taking crestor 5 mg daily.  ? ?Denies changes in tooth paste, unintentional weight loss, lesions, or changes to diet.  ? ?HLD ?Although pt has been experiencing the above sx, she has remained compliant with taking the crestor 5 mg daily. She is interested in re-checking her levels today. Denies CP or SOB.  ? ?Past Medical History:  ?Diagnosis Date  ? GERD (gastroesophageal reflux disease)   ? Hyperlipidemia   ? ?  ?Social History  ? ?Tobacco Use  ? Smoking status: Former  ?  Types: Cigarettes  ?  Quit date: 05/23/1996  ?  Years since quitting: 25.6  ? Smokeless tobacco: Never  ?Substance Use Topics  ? Alcohol use: No  ?  Alcohol/week: 0.0 standard drinks  ? Drug use: No  ? ? ?Past  Surgical History:  ?Procedure Laterality Date  ? ABDOMINAL HYSTERECTOMY    ? APPENDECTOMY  2006  ? BALLOON DILATION N/A 11/25/2021  ? Procedure: BALLOON DILATION;  Surgeon: Thornton Park, MD;  Location: [facility];  Service: Gastroenterology;  Laterality: N/A;  ? BIOPSY  11/25/2021  ? Procedure: BIOPSY;  Surgeon: Thornton Park, MD;  Location: [facility];  Service: Gastroenterology;;  ? disc ectomy    ? ESOPHAGOGASTRODUODENOSCOPY (EGD) WITH PROPOFOL N/A 11/25/2021  ? Procedure: ESOPHAGOGASTRODUODENOSCOPY (EGD) WITH PROPOFOL;  Surgeon: Thornton Park, MD;  Location: [facility];  Service: Gastroenterology;  Laterality: N/A;  ? pheumothorax     ? PLEURAL SCARIFICATION    ? TONSILECTOMY/ADENOIDECTOMY WITH MYRINGOTOMY    ? ? ?Family History  ?Problem Relation Age of Onset  ? Heart disease Mother   ? Cancer Mother   ?     breast cancer  ? Breast cancer Mother   ? Cancer Father   ?     myo dysplasia  ? Hyperlipidemia Brother   ? Hypertension Brother   ? Mental illness Brother   ? Esophageal cancer Maternal Aunt   ? Cancer Maternal Grandmother   ?     cervical or uterine  ? Cancer Paternal Grandmother   ?     ovarian  ? Cancer Paternal Grandfather   ?  prostate  ? Colon cancer Neg Hx   ? Pancreatic cancer Neg Hx   ? ? ?No Known Allergies ? ?Current Medications:  ? ?Current Outpatient Medications:  ?  aspirin 81 MG tablet, Take 81 mg by mouth daily., Disp: , Rfl:  ?  BIOTIN PO, Take 1 tablet by mouth daily., Disp: , Rfl:  ?  Calcium Carbonate-Vitamin D (CALTRATE 600+D PO), Take 1 tablet by mouth daily., Disp: , Rfl:  ?  diazepam (VALIUM) 5 MG tablet, Take 1 tablet (5 mg total) by mouth every 12 (twelve) hours as needed for anxiety. (Patient taking differently: Take 5 mg by mouth every 12 (twelve) hours as needed for anxiety Conservation officer, historic buildings Jason Fila).), Disp: 10 tablet, Rfl: 0 ?  doxycycline (ADOXA) 50 MG tablet, Take 50 mg by mouth daily as needed (rosacea)., Disp: , Rfl:  ?  Estradiol (YUVAFEM) 10 MCG TABS vaginal  tablet, Place 1-2 tablets (10-20 mcg total) vaginally once a week., Disp: 24 tablet, Rfl: 1 ?  metroNIDAZOLE (METROCREAM) 0.75 % cream, Apply 1 application topically once a week., Disp: , Rfl:  ?  Misc Natural Products (GLUCOSAMINE CHOND COMPLEX/MSM) TABS, Take 1 tablet by mouth daily., Disp: , Rfl:  ?  Multiple Vitamin (MULTIVITAMIN) capsule, Take 1 capsule by mouth daily., Disp: , Rfl:  ?  Omega-3 Fatty Acids (FISH OIL) 1000 MG CAPS, Take 2 each by mouth in the morning and at bedtime., Disp: , Rfl:  ?  pantoprazole (PROTONIX) 40 MG tablet, Take 1 tablet (40 mg total) by mouth 2 (two) times daily., Disp: 140 tablet, Rfl: 0 ?  rosuvastatin (CRESTOR) 5 MG tablet, Take 1 tablet (5 mg total) by mouth daily., Disp: 90 tablet, Rfl: 3 ?  famotidine (PEPCID) 40 MG tablet, Take 1 tablet (40 mg total) by mouth 2 (two) times daily., Disp: 60 tablet, Rfl: 5 ?  [START ON 02/07/2022] pantoprazole (PROTONIX) 40 MG tablet, Take 1 tablet (40 mg total) by mouth daily., Disp: 90 tablet, Rfl: 3 ? ?Current Facility-Administered Medications:  ?  0.9 %  sodium chloride infusion, 500 mL, Intravenous, Continuous, Thornton Park, MD  ? ?Review of Systems:  ? ?ROS ?Negative unless otherwise specified per HPI. ?Vitals:  ? ?Vitals:  ? 12/31/21 1402  ?BP: 110/74  ?Pulse: 72  ?Temp: 98.4 ?F (36.9 ?C)  ?SpO2: 97%  ?Weight: 125 lb 12.8 oz (57.1 kg)  ?Height: '5\' 3"'$  (1.6 m)  ?   ?Body mass index is 22.28 kg/m?. ? ?Physical Exam:  ? ?Physical Exam ?Vitals and nursing note reviewed.  ?Constitutional:   ?   General: She is not in acute distress. ?   Appearance: She is well-developed. She is not ill-appearing or toxic-appearing.  ?HENT:  ?   Mouth/Throat:  ?   Comments: Small raised hematoma located to left-side tip of tongue ?Cardiovascular:  ?   Rate and Rhythm: Normal rate and regular rhythm.  ?   Pulses: Normal pulses.  ?   Heart sounds: Normal heart sounds, S1 normal and S2 normal.  ?Pulmonary:  ?   Effort: Pulmonary effort is normal.  ?   Breath  sounds: Normal breath sounds.  ?Skin: ?   General: Skin is warm and dry.  ?Neurological:  ?   Mental Status: She is alert.  ?   GCS: GCS eye subscore is 4. GCS verbal subscore is 5. GCS motor subscore is 6.  ?Psychiatric:     ?   Speech: Speech normal.     ?   Behavior: Behavior normal.  Behavior is cooperative.  ? ? ?Assessment and Plan:  ? ?Dysgeusia ?Unclear etiology -- possibly related to crestor? ?Update labs, will make recommendations accordingly  ?Likely hold crestor to see if this makes a difference with her symptoms ?If it does not, will pursue possible further work-up if labwork unrevealing or send to ENT ?Follow up if new/worsening symptoms or concerns occur ? ?Vitamin D deficiency ?Update labs, will make recommendations accordingly  ? ?Hyperlipidemia, mild ?Update lipid profile today ?Discussed possibility of trialing a different medication based on results/clinical sx ? ?I,Havlyn C Ratchford,acting as a scribe for [employer], PA.,have documented all relevant documentation on the behalf of Inda Coke, PA,as directed by  Inda Coke, PA while in the presence of Inda Coke, [place]. ? ?IInda Coke, PA, have reviewed all documentation for this visit. The documentation on 12/31/21 for the exam, diagnosis, procedures, and orders are all accurate and complete. ? ? ?Inda Coke, PA-C ? ?

## 2022-01-01 ENCOUNTER — Ambulatory Visit: Payer: PPO | Admitting: Gastroenterology

## 2022-01-14 ENCOUNTER — Ambulatory Visit: Payer: PPO | Admitting: Gastroenterology

## 2022-01-14 ENCOUNTER — Encounter (HOSPITAL_COMMUNITY): Payer: Self-pay | Admitting: Emergency Medicine

## 2022-01-14 ENCOUNTER — Other Ambulatory Visit: Payer: Self-pay

## 2022-01-14 ENCOUNTER — Encounter: Payer: Self-pay | Admitting: Gastroenterology

## 2022-01-14 ENCOUNTER — Ambulatory Visit (HOSPITAL_COMMUNITY)
Admission: EM | Admit: 2022-01-14 | Discharge: 2022-01-14 | Disposition: A | Discharge status: Home / Self Care | Payer: PPO | Attending: Family Medicine | Admitting: Family Medicine

## 2022-01-14 VITALS — BP 116/64 | HR 72 | Ht 63.0 in | Wt 127.8 lb

## 2022-01-14 DIAGNOSIS — R1310 Dysphagia, unspecified: Secondary | ICD-10-CM | POA: Diagnosis not present

## 2022-01-14 DIAGNOSIS — K219 Gastro-esophageal reflux disease without esophagitis: Secondary | ICD-10-CM | POA: Diagnosis not present

## 2022-01-14 DIAGNOSIS — J069 Acute upper respiratory infection, unspecified: Secondary | ICD-10-CM

## 2022-01-14 DIAGNOSIS — K253 Acute gastric ulcer without hemorrhage or perforation: Secondary | ICD-10-CM

## 2022-01-14 NOTE — Progress Notes (Signed)
? ? ? ?01/14/2022 ?[name] ?[id number] ?1951/07/03 ? ? ?HISTORY OF PRESENT ILLNESS: This is a 71 year old female who I saw in consult back in early January for complaints of reflux and some dysphagia.  See my office note from 09/27/2021 for further details.  Nonetheless, she wanted to have an EGD to evaluate some dysphagia and any consequence that her reflux may have had on her esophagus. ? ?EGD 11/2021: ? ?- No endoscopic esophageal abnormality to explain patient's dysphagia. Esophagus dilated. ?- Non-bleeding gastric ulcers with no stigmata of bleeding. Biopsied. ?- A few gastric polyps. Biopsied. ?- Erythematous duodenopathy. ? ?A. STOMACH, GASTRIC, ANTRUM, BODY, FUNDUS, BIOPSY:  ?- Localized intestinal metaplasia.  No inflammation, dysplasia, or  ?malignancy.  ? ?B. STOMACH, POLYPECTOMY:  ?- Patchy chronic gastritis.  No intestinal metaplasia, dysplasia, or  ?malignancy.  Warthin-Starry stain negative for Helicobacter pylori.  ? ?C. ESOPHAGUS, DISTAL, BIOPSY:  ?- Squamous mucosa fragments.  No inflammation, dysplasia, or malignancy.  ?- Detached fragment of gastric mucosa.  No inflammation, intestinal  ?metaplasia, dysplasia or malignancy.  ? ?D. ESOPHAGUS, MID/PROXIMAL, BIOPSY:  ?- Benign squamous mucosa.  No inflammation, metaplasia, dysplasia or  ?malignancy.  ?- Incidental fragment of benign smooth muscle.  ? ?She is now on pantoprazole 40 mg twice daily, which she is to continue for 10 weeks and then go down to once daily.  She says that the dysphagia has not been an issue recently.  She uses only rare NSAIDs. ? ?She has had colonoscopies with Dr. Earlean Shawl, the last in November 2018 at which time the study was normal and he recommended a repeat in 10 years.  She says that she has never had polyps on any of her colonoscopies. ? ?Past Medical History:  ?Diagnosis Date  ? GERD (gastroesophageal reflux disease)   ? Hyperlipidemia   ? ?Past Surgical History:  ?Procedure Laterality Date  ? ABDOMINAL  HYSTERECTOMY    ? APPENDECTOMY  2006  ? BALLOON DILATION N/A 11/25/2021  ? Procedure: BALLOON DILATION;  Surgeon: Thornton Park, MD;  Location: [facility];  Service: Gastroenterology;  Laterality: N/A;  ? BIOPSY  11/25/2021  ? Procedure: BIOPSY;  Surgeon: Thornton Park, MD;  Location: [facility];  Service: Gastroenterology;;  ? disc ectomy    ? ESOPHAGOGASTRODUODENOSCOPY (EGD) WITH PROPOFOL N/A 11/25/2021  ? Procedure: ESOPHAGOGASTRODUODENOSCOPY (EGD) WITH PROPOFOL;  Surgeon: Thornton Park, MD;  Location: [facility];  Service: Gastroenterology;  Laterality: N/A;  ? pheumothorax     ? PLEURAL SCARIFICATION    ? TONSILECTOMY/ADENOIDECTOMY WITH MYRINGOTOMY    ? ? reports that she quit smoking about 25 years ago. Her smoking use included cigarettes. She has never used smokeless tobacco. She reports that she does not drink alcohol and does not use drugs. ?family history includes Breast cancer in her mother; Cancer in her father, maternal grandmother, mother, paternal grandfather, and paternal grandmother; Esophageal cancer in her maternal aunt; Heart disease in her mother; Hyperlipidemia in her brother; Hypertension in her brother; Mental illness in her brother. ?No Known Allergies ? ?  ?Outpatient Encounter Medications as of 01/14/2022  ?Medication Sig  ? aspirin 81 MG tablet Take 81 mg by mouth daily.  ? BIOTIN PO Take 1 tablet by mouth daily.  ? Calcium Carbonate-Vitamin D (CALTRATE 600+D PO) Take 1 tablet by mouth daily.  ? diazepam (VALIUM) 5 MG tablet Take 1 tablet (5 mg total) by mouth every 12 (twelve) hours as needed for anxiety. (Patient taking differently: Take 5 mg by mouth every 12 (  twelve) hours as needed for anxiety Conservation officer, historic buildings Jason Fila).)  ? doxycycline (ADOXA) 50 MG tablet Take 50 mg by mouth daily as needed (rosacea).  ? Estradiol (YUVAFEM) 10 MCG TABS vaginal tablet Place 1-2 tablets (10-20 mcg total) vaginally once a week.  ? metroNIDAZOLE (METROCREAM) 0.75 % cream Apply 1 application  topically once a week.  ? Misc Natural Products (GLUCOSAMINE CHOND COMPLEX/MSM) TABS Take 1 tablet by mouth daily.  ? Multiple Vitamin (MULTIVITAMIN) capsule Take 1 capsule by mouth daily.  ? Omega-3 Fatty Acids (FISH OIL) 1000 MG CAPS Take 2 each by mouth in the morning and at bedtime.  ? pantoprazole (PROTONIX) 40 MG tablet Take 1 tablet (40 mg total) by mouth 2 (two) times daily.  ? rosuvastatin (CRESTOR) 5 MG tablet Take 1 tablet (5 mg total) by mouth daily.  ? [START ON 02/07/2022] pantoprazole (PROTONIX) 40 MG tablet Take 1 tablet (40 mg total) by mouth daily. (Patient not taking: Reported on 01/14/2022)  ? [DISCONTINUED] famotidine (PEPCID) 40 MG tablet Take 1 tablet (40 mg total) by mouth 2 (two) times daily.  ? ?Facility-Administered Encounter Medications as of 01/14/2022  ?Medication  ? 0.9 %  sodium chloride infusion  ? ? ?REVIEW OF SYSTEMS  : All other systems reviewed and negative except where noted in the History of Present Illness. ? ? ?PHYSICAL EXAM: ?BP 116/64   Pulse 72   Ht '5\' 3"'$  (1.6 m)   Wt 127 lb 12.8 oz (58 kg)   BMI 22.64 kg/m?  ?General: Well developed white female in no acute distress ?Head: Normocephalic and atraumatic ?Eyes:  Sclerae anicteric, conjunctiva pink. ?Ears: Normal auditory acuity ?Lungs: Clear throughout to auscultation; no W/R/R. ?Heart: Regular rate and rhythm; no M/R/G. ?Abdomen: Soft, non-distended.  BS present.  Non-tender. ?Musculoskeletal: Symmetrical with no gross deformities  ?Skin: No lesions on visible extremities ?Extremities: No edema  ?Neurological: Alert oriented x 4, grossly non-focal ?Psychological:  Alert and cooperative. Normal mood and affect ? ?ASSESSMENT AND PLAN: ?*GERD and dysphagia: Had an EGD with no source of dysphagia noted in the esophagus, but was empirically dilated.  Did have gastric ulcers.  H. pylori was negative and she uses only rare NSAIDs.  She is now on pantoprazole 40 mg twice daily, which she supposed to continue for 10 weeks and then  go to once daily.  She has concerns about osteoporosis.  She does have a repeat DEXA scan coming up in the next few months.  May need to consider going to Pepcid twice daily pending her results of that. ? ? ?CC:  Inda Coke, PA ? ?  ?

## 2022-01-14 NOTE — ED Triage Notes (Signed)
Pt reports cough x 5 days and "scratchy throat" x 6 days.  States she has had a productive cough and denies fever.  ?

## 2022-01-14 NOTE — ED Provider Notes (Signed)
?[facility] ? ? ? ?CSN: [id number] ?Arrival date & time: 01/14/22  1107 ? ? ?  ? ?History   ?Chief Complaint ?Chief Complaint  ?Patient presents with  ? Cough  ? Sore Throat  ? ? ?HPI ?[name] is a 71 y.o. female.  ? ? ?Cough ?Sore Throat ? ?Here with a 5-day history of cough.  It has been fairly persistent and sometimes productive of clear mucus.  No shortness of breath or wheezing.  She did have a scratchy throat start the day before the cough started.  In the evening her throat has been sore some due to the cough.  No fever or chills.  Not much nasal congestion. ? ?Past Medical History:  ?Diagnosis Date  ? GERD (gastroesophageal reflux disease)   ? Hyperlipidemia   ? ? ?Patient Active Problem List  ? Diagnosis Date Noted  ? Acute gastric ulcer without hemorrhage or perforation   ? Difficult intubation 10/15/2021  ? Gastroesophageal reflux disease 09/27/2021  ? Dysphagia 09/27/2021  ? Endometriosis 06/04/2021  ? Atrophic vaginitis 06/04/2021  ? Reduced libido 06/04/2021  ? Hyperlipidemia, mild 12/03/2020  ? Trigger thumb, left thumb 11/09/2019  ? Stress incontinence 06/30/2018  ? S/P total abdominal hysterectomy 10/03/2016  ? Abnormal mammogram 02/05/2016  ? Osteoporosis without current pathological fracture 12/13/2015  ? Nonspecific abnormal electrocardiogram (ECG) (EKG) 09/27/2013  ? Collagenous colitis 09/23/2003  ? ? ?Past Surgical History:  ?Procedure Laterality Date  ? ABDOMINAL HYSTERECTOMY    ? APPENDECTOMY  2006  ? BALLOON DILATION N/A 11/25/2021  ? Procedure: BALLOON DILATION;  Surgeon: Thornton Park, MD;  Location: [facility];  Service: Gastroenterology;  Laterality: N/A;  ? BIOPSY  11/25/2021  ? Procedure: BIOPSY;  Surgeon: Thornton Park, MD;  Location: [facility];  Service: Gastroenterology;;  ? disc ectomy    ? ESOPHAGOGASTRODUODENOSCOPY (EGD) WITH PROPOFOL N/A 11/25/2021  ? Procedure: ESOPHAGOGASTRODUODENOSCOPY (EGD) WITH PROPOFOL;  Surgeon: Thornton Park, MD;  Location:  [facility];  Service: Gastroenterology;  Laterality: N/A;  ? pheumothorax     ? PLEURAL SCARIFICATION    ? TONSILECTOMY/ADENOIDECTOMY WITH MYRINGOTOMY    ? ? ?OB History   ?No obstetric history on file. ?  ? ? ? ?Home Medications   ? ?Prior to Admission medications   ?Medication Sig Start Date End Date Taking? Authorizing Provider  ?aspirin 81 MG tablet Take 81 mg by mouth daily.    [provider]  ?BIOTIN PO Take 1 tablet by mouth daily.    [provider]  ?Calcium Carbonate-Vitamin D (CALTRATE 600+D PO) Take 1 tablet by mouth daily.    [provider]  ?diazepam (VALIUM) 5 MG tablet Take 1 tablet (5 mg total) by mouth every 12 (twelve) hours as needed for anxiety. ?Patient taking differently: Take 5 mg by mouth every 12 (twelve) hours as needed for anxiety Conservation officer, historic buildings Jason Fila). 10/15/21   Inda Coke, PA  ?doxycycline (ADOXA) 50 MG tablet Take 50 mg by mouth daily as needed (rosacea).    [provider]  ?Estradiol (YUVAFEM) 10 MCG TABS vaginal tablet Place 1-2 tablets (10-20 mcg total) vaginally once a week. 08/20/21   Inda Coke, PA  ?famotidine (PEPCID) 40 MG tablet Take 1 tablet (40 mg total) by mouth 2 (two) times daily. 09/27/21   Zehr, Laban Emperor, PA-C  ?metroNIDAZOLE (METROCREAM) 0.75 % cream Apply 1 application topically once a week. 12/20/19   [provider]  ?Misc Natural Products (GLUCOSAMINE CHOND COMPLEX/MSM) TABS Take 1 tablet by mouth  daily. 03/22/18   [provider]  ?Multiple Vitamin (MULTIVITAMIN) capsule Take 1 capsule by mouth daily.    [provider]  ?Omega-3 Fatty Acids (FISH OIL) 1000 MG CAPS Take 2 each by mouth in the morning and at bedtime.    [provider]  ?pantoprazole (PROTONIX) 40 MG tablet Take 1 tablet (40 mg total) by mouth 2 (two) times daily. 11/29/21 02/07/22  Thornton Park, MD  ?pantoprazole (PROTONIX) 40 MG tablet Take 1 tablet (40 mg total) by mouth daily. 02/07/22   Thornton Park, MD   ?rosuvastatin (CRESTOR) 5 MG tablet Take 1 tablet (5 mg total) by mouth daily. 06/11/21   Inda Coke, PA  ? ? ?Family History ?Family History  ?Problem Relation Age of Onset  ? Heart disease Mother   ? Cancer Mother   ?     breast cancer  ? Breast cancer Mother   ? Cancer Father   ?     myo dysplasia  ? Hyperlipidemia Brother   ? Hypertension Brother   ? Mental illness Brother   ? Esophageal cancer Maternal Aunt   ? Cancer Maternal Grandmother   ?     cervical or uterine  ? Cancer Paternal Grandmother   ?     ovarian  ? Cancer Paternal Grandfather   ?     prostate  ? Colon cancer Neg Hx   ? Pancreatic cancer Neg Hx   ? ? ?Social History ?Social History  ? ?Tobacco Use  ? Smoking status: Former  ?  Types: Cigarettes  ?  Quit date: 05/23/1996  ?  Years since quitting: 25.6  ? Smokeless tobacco: Never  ?Substance Use Topics  ? Alcohol use: No  ?  Alcohol/week: 0.0 standard drinks  ? Drug use: No  ? ? ? ?Allergies   ?Patient has no known allergies. ? ? ?Review of Systems ?Review of Systems  ?Respiratory:  Positive for cough.   ? ? ?Physical Exam ?Triage Vital Signs ?ED Triage Vitals  ?Enc Vitals Group  ?   BP 01/14/22 1115 99/69  ?   Pulse Rate 01/14/22 1115 70  ?   Resp 01/14/22 1115 16  ?   Temp 01/14/22 1115 97.7 ?F (36.5 ?C)  ?   Temp Source 01/14/22 1115 Oral  ?   SpO2 01/14/22 1115 97 %  ?   Weight 01/14/22 1114 125 lb 12.8 oz (57.1 kg)  ?   Height 01/14/22 1114 '5\' 3"'$  (1.6 m)  ?   Head Circumference --   ?   Peak Flow --   ?   Pain Score 01/14/22 1114 0  ?   Pain Loc --   ?   Pain Edu? --   ?   Excl. in [place]? --   ? ?No data found. ? ?Updated Vital Signs ?BP 99/69 (BP Location: Left Arm)   Pulse 70   Temp 97.7 ?F (36.5 ?C) (Oral)   Resp 16   Ht '5\' 3"'$  (1.6 m)   Wt 57.1 kg   SpO2 97%   BMI 22.28 kg/m?  ? ?Visual Acuity ?Right Eye Distance:   ?Left Eye Distance:   ?Bilateral Distance:   ? ?Right Eye Near:   ?Left Eye Near:    ?Bilateral Near:    ? ?Physical Exam ?Vitals reviewed.  ?Constitutional:   ?    General: She is not in acute distress. ?   Appearance: She is not toxic-appearing.  ?HENT:  ?   Right Ear: Tympanic  membrane and ear canal normal.  ?   Left Ear: Tympanic membrane and ear canal normal.  ?   Nose: Nose normal.  ?   Mouth/Throat:  ?   Mouth: Mucous membranes are moist.  ?   Comments: There is 1 spot of erythema on the soft palate.  There is some clear mucus in the oropharynx ?Eyes:  ?   Extraocular Movements: Extraocular movements intact.  ?   Conjunctiva/sclera: Conjunctivae normal.  ?   Pupils: Pupils are equal, round, and reactive to light.  ?Cardiovascular:  ?   Rate and Rhythm: Normal rate and regular rhythm.  ?   Heart sounds: No murmur heard. ?Pulmonary:  ?   Effort: Pulmonary effort is normal. No respiratory distress.  ?   Breath sounds: No wheezing, rhonchi or rales.  ?Musculoskeletal:  ?   Cervical back: Neck supple.  ?Lymphadenopathy:  ?   Cervical: No cervical adenopathy.  ?Skin: ?   Capillary Refill: Capillary refill takes less than 2 seconds.  ?   Coloration: Skin is not jaundiced or pale.  ?Neurological:  ?   General: No focal deficit present.  ?   Mental Status: She is alert and oriented to person, place, and time.  ?Psychiatric:     ?   Behavior: Behavior normal.  ? ? ? ?UC Treatments / Results  ?Labs ?(all labs ordered are listed, but only abnormal results are displayed) ?Labs Reviewed - No data to display ? ?EKG ? ? ?Radiology ?No results found. ? ?Procedures ?Procedures (including critical care time) ? ?Medications Ordered in UC ?Medications - No data to display ? ?Initial Impression / Assessment and Plan / UC Course  ?I have reviewed the triage vital signs and the nursing notes. ? ?Pertinent labs & imaging results that were available during my care of the patient were reviewed by me and considered in my medical decision making (see chart for details). ? ?  ? ?She is mainly interested in seeing if she is contagious, as she will be seeing some Alzheimer patients on April 27.  She  wants to go ahead and do a rapid test at home for COVID, instead of our doing the PCR here. ? ?She already has some Tessalon Perles at home ?Final Clinical Impressions(s) / UC Diagnoses  ? ?Final diagnoses:  ?Vir

## 2022-01-14 NOTE — Discharge Instructions (Addendum)
You can take Mucinex D as needed for your congestion. ? ?Also, the benzonatate/Tessalon Perles you have at home can be taken for cough--1 every 8 hours as needed. ? ? ?

## 2022-01-14 NOTE — Patient Instructions (Signed)
If you are age 69 or older, your body mass index should be between 23-30. Your Body mass index is 22.64 kg/m?Marland Kitchen If this is out of the aforementioned range listed, please consider follow up with your Primary Care Provider. ?________________________________________________________ ? ?The  GI providers would like to encourage you to use [facility] to communicate with providers for non-urgent requests or questions.  Due to long hold times on the telephone, sending your provider a message by [facility] may be a faster and more efficient way to get a response.  Please allow 48 business hours for a response.  Please remember that this is for non-urgent requests.  ?_______________________________________________________ ? ?Continue Pantoprazole as discussed. ? ?Message the office with any changes or updates. ? ?Keep appointment for Dexa scan and update [place] if we need to make any changes. ? ?Thank you for entrusting me with your care and choosing [facility]. ? ?Alonza Bogus, PA-C ?

## 2022-01-19 ENCOUNTER — Encounter: Payer: Self-pay | Admitting: Physician Assistant

## 2022-01-20 ENCOUNTER — Ambulatory Visit (INDEPENDENT_AMBULATORY_CARE_PROVIDER_SITE_OTHER): Payer: PPO | Admitting: Physician Assistant

## 2022-01-20 ENCOUNTER — Encounter: Payer: Self-pay | Admitting: Physician Assistant

## 2022-01-20 VITALS — BP 120/70 | HR 80 | Temp 98.2°F | Ht 63.0 in | Wt 127.0 lb

## 2022-01-20 DIAGNOSIS — J0110 Acute frontal sinusitis, unspecified: Secondary | ICD-10-CM

## 2022-01-20 MED ORDER — DOXYCYCLINE HYCLATE 100 MG PO TABS: 100.0000 mg | ORAL_TABLET | Freq: Two times a day (BID) | ORAL | 0 refills | Status: DC

## 2022-01-20 NOTE — Progress Notes (Signed)
[name] is a 71 y.o. female here for a cough. ? ?History of Present Illness:  ? ?Chief Complaint  ?Patient presents with  ? Sinus Problem  ?  Pt c/o sinus issues x 10 days, scratchy throat, nasal drainage is green, coughing and expectorating yellow sputum, ears popping.  ? ? ?HPI ? ?Cough ?On 01/14/22, pt presented to the urgent care with c/o productive cough that had been onset for 5 days. At the time she stated initial sx was a scratchy throat the day before her cough started followed by some throat soreness. Denied fever, chills or nasal congestion. Although her sx were manageable at this time, she was mainly concerned if she were contagious or not due to her type of work. After further examination she was believed to have a viral URI with cough. Pt was discharged with recommendation to take mucinex DM and use her at home tessalon perles, 1 every 8 hours prn, for her cough.  ? ?Despite using these types of interventions, pt found her sx not only didn't improve but worsened. Currently she still experiencing a productive cough with now greenish/yellowish discharge as well as a sudden onset of head congestion and left eye discharge. According to pt, her head congestion has caused her ears to pop non stop. In an effort to find some relief, she tried using flonase nasal spray daily and an OTC allergy medication which she found helped due to it causing drowsiness. At this time she would like to get some relief as quickly as possible especially since her husband has a major surgery coming up. Denies fever or chills.  ? ?Past Medical History:  ?Diagnosis Date  ? GERD (gastroesophageal reflux disease)   ? Hyperlipidemia   ? ?  ?Social History  ? ?Tobacco Use  ? Smoking status: Former  ?  Types: Cigarettes  ?  Quit date: 05/23/1996  ?  Years since quitting: 25.6  ? Smokeless tobacco: Never  ?Substance Use Topics  ? Alcohol use: No  ?  Alcohol/week: 0.0 standard drinks  ? Drug use: No  ? ? ?Past Surgical History:   ?Procedure Laterality Date  ? ABDOMINAL HYSTERECTOMY    ? APPENDECTOMY  2006  ? BALLOON DILATION N/A 11/25/2021  ? Procedure: BALLOON DILATION;  Surgeon: Thornton Park, MD;  Location: [facility];  Service: Gastroenterology;  Laterality: N/A;  ? BIOPSY  11/25/2021  ? Procedure: BIOPSY;  Surgeon: Thornton Park, MD;  Location: [facility];  Service: Gastroenterology;;  ? disc ectomy    ? ESOPHAGOGASTRODUODENOSCOPY (EGD) WITH PROPOFOL N/A 11/25/2021  ? Procedure: ESOPHAGOGASTRODUODENOSCOPY (EGD) WITH PROPOFOL;  Surgeon: Thornton Park, MD;  Location: [facility];  Service: Gastroenterology;  Laterality: N/A;  ? pheumothorax     ? PLEURAL SCARIFICATION    ? TONSILECTOMY/ADENOIDECTOMY WITH MYRINGOTOMY    ? ? ?Family History  ?Problem Relation Age of Onset  ? Heart disease Mother   ? Cancer Mother   ?     breast cancer  ? Breast cancer Mother   ? Cancer Father   ?     myo dysplasia  ? Hyperlipidemia Brother   ? Hypertension Brother   ? Mental illness Brother   ? Esophageal cancer Maternal Aunt   ? Cancer Maternal Grandmother   ?     cervical or uterine  ? Cancer Paternal Grandmother   ?     ovarian  ? Cancer Paternal Grandfather   ?     prostate  ? Colon cancer Neg Hx   ?  Pancreatic cancer Neg Hx   ? ? ?No Known Allergies ? ?Current Medications:  ? ?Current Outpatient Medications:  ?  aspirin 81 MG tablet, Take 81 mg by mouth daily., Disp: , Rfl:  ?  BIOTIN PO, Take 1 tablet by mouth daily., Disp: , Rfl:  ?  Calcium Carbonate-Vitamin D (CALTRATE 600+D PO), Take 1 tablet by mouth daily., Disp: , Rfl:  ?  diazepam (VALIUM) 5 MG tablet, Take 1 tablet (5 mg total) by mouth every 12 (twelve) hours as needed for anxiety. (Patient taking differently: Take 5 mg by mouth every 12 (twelve) hours as needed for anxiety Conservation officer, historic buildings Jason Fila).), Disp: 10 tablet, Rfl: 0 ?  doxycycline (ADOXA) 50 MG tablet, Take 50 mg by mouth daily as needed (rosacea)., Disp: , Rfl:  ?  doxycycline (VIBRA-TABS) 100 MG tablet, Take 1 tablet (100  mg total) by mouth 2 (two) times daily., Disp: 14 tablet, Rfl: 0 ?  Estradiol (YUVAFEM) 10 MCG TABS vaginal tablet, Place 1-2 tablets (10-20 mcg total) vaginally once a week., Disp: 24 tablet, Rfl: 1 ?  metroNIDAZOLE (METROCREAM) 0.75 % cream, Apply 1 application topically once a week., Disp: , Rfl:  ?  Misc Natural Products (GLUCOSAMINE CHOND COMPLEX/MSM) TABS, Take 1 tablet by mouth daily., Disp: , Rfl:  ?  Multiple Vitamin (MULTIVITAMIN) capsule, Take 1 capsule by mouth daily., Disp: , Rfl:  ?  Omega-3 Fatty Acids (FISH OIL) 1000 MG CAPS, Take 2 each by mouth in the morning and at bedtime., Disp: , Rfl:  ?  pantoprazole (PROTONIX) 40 MG tablet, Take 1 tablet (40 mg total) by mouth 2 (two) times daily., Disp: 140 tablet, Rfl: 0 ?  rosuvastatin (CRESTOR) 5 MG tablet, Take 1 tablet (5 mg total) by mouth daily., Disp: 90 tablet, Rfl: 3 ?  [START ON 02/07/2022] pantoprazole (PROTONIX) 40 MG tablet, Take 1 tablet (40 mg total) by mouth daily. (Patient not taking: Reported on 01/14/2022), Disp: 90 tablet, Rfl: 3 ? ?Current Facility-Administered Medications:  ?  0.9 %  sodium chloride infusion, 500 mL, Intravenous, Continuous, Thornton Park, MD  ? ?Review of Systems:  ? ?ROS ?Negative unless otherwise specified per HPI. ?Vitals:  ? ?Vitals:  ? 01/20/22 1118  ?BP: 120/70  ?Pulse: 80  ?Temp: 98.2 ?F (36.8 ?C)  ?TempSrc: Temporal  ?SpO2: 97%  ?Weight: 127 lb (57.6 kg)  ?Height: '5\' 3"'$  (1.6 m)  ?   ?Body mass index is 22.5 kg/m?. ? ?Physical Exam:  ? ?Physical Exam ?Vitals and nursing note reviewed.  ?Constitutional:   ?   General: She is not in acute distress. ?   Appearance: She is well-developed. She is not ill-appearing or toxic-appearing.  ?HENT:  ?   Head: Normocephalic and atraumatic.  ?   Right Ear: Tympanic membrane, ear canal and external ear normal. Tympanic membrane is not erythematous, retracted or bulging.  ?   Left Ear: Tympanic membrane, ear canal and external ear normal. Tympanic membrane is not  erythematous, retracted or bulging.  ?   Nose: Nose normal.  ?   Right Sinus: No maxillary sinus tenderness or frontal sinus tenderness.  ?   Left Sinus: No maxillary sinus tenderness or frontal sinus tenderness.  ?   Mouth/Throat:  ?   Pharynx: Uvula midline. Posterior oropharyngeal erythema present.  ?Eyes:  ?   General: Lids are normal.  ?   Conjunctiva/sclera: Conjunctivae normal.  ?Neck:  ?   Trachea: Trachea normal.  ?Cardiovascular:  ?   Rate and Rhythm: Normal rate  and regular rhythm.  ?   Pulses: Normal pulses.  ?   Heart sounds: Normal heart sounds, S1 normal and S2 normal.  ?Pulmonary:  ?   Effort: Pulmonary effort is normal.  ?   Breath sounds: Normal breath sounds. No decreased breath sounds, wheezing, rhonchi or rales.  ?Lymphadenopathy:  ?   Cervical: No cervical adenopathy.  ?Skin: ?   General: Skin is warm and dry.  ?Neurological:  ?   Mental Status: She is alert.  ?   GCS: GCS eye subscore is 4. GCS verbal subscore is 5. GCS motor subscore is 6.  ?Psychiatric:     ?   Speech: Speech normal.     ?   Behavior: Behavior normal. Behavior is cooperative.  ? ? ?Assessment and Plan:  ? ?Acute non-recurrent frontal sinusitis ?No red flags ?Start doxycycline 100 mg twice daily x 7 days ?Continue use of flonase nasal spray daily  ?Encouraged patient to push more fluids and get plenty of rest  ?Follow up if new/worsening symptoms or concerns occur  ? ? ?I,Havlyn C Ratchford,acting as a scribe for [employer], PA.,have documented all relevant documentation on the behalf of Inda Coke, PA,as directed by  Inda Coke, PA while in the presence of Inda Coke, [place]. ? ?IInda Coke, PA, have reviewed all documentation for this visit. The documentation on 01/20/22 for the exam, diagnosis, procedures, and orders are all accurate and complete. ? ? ?Inda Coke, PA-C ? ?

## 2022-01-21 NOTE — Progress Notes (Signed)
Reviewed.  Aydan Levitz L. Herbert Marken, MD, MPH  

## 2022-04-05 ENCOUNTER — Encounter: Payer: Self-pay | Admitting: Physician Assistant

## 2022-04-05 DIAGNOSIS — M810 Age-related osteoporosis without current pathological fracture: Secondary | ICD-10-CM

## 2022-04-21 ENCOUNTER — Other Ambulatory Visit: Payer: Self-pay | Admitting: Physician Assistant

## 2022-04-21 DIAGNOSIS — M810 Age-related osteoporosis without current pathological fracture: Secondary | ICD-10-CM

## 2022-04-23 ENCOUNTER — Ambulatory Visit (INDEPENDENT_AMBULATORY_CARE_PROVIDER_SITE_OTHER)
Admission: RE | Admit: 2022-04-23 | Discharge: 2022-04-23 | Disposition: A | Discharge status: Home / Self Care | Payer: PPO | Source: Ambulatory Visit | Attending: Physician Assistant | Admitting: Physician Assistant

## 2022-04-23 DIAGNOSIS — M810 Age-related osteoporosis without current pathological fracture: Secondary | ICD-10-CM

## 2022-04-25 ENCOUNTER — Ambulatory Visit (INDEPENDENT_AMBULATORY_CARE_PROVIDER_SITE_OTHER): Payer: PPO | Admitting: Physician Assistant

## 2022-04-25 ENCOUNTER — Encounter: Payer: Self-pay | Admitting: Physician Assistant

## 2022-04-25 VITALS — BP 110/70 | HR 58 | Temp 98.0°F | Ht 63.0 in | Wt 125.0 lb

## 2022-04-25 DIAGNOSIS — M810 Age-related osteoporosis without current pathological fracture: Secondary | ICD-10-CM | POA: Diagnosis not present

## 2022-04-25 DIAGNOSIS — L603 Nail dystrophy: Secondary | ICD-10-CM | POA: Diagnosis not present

## 2022-04-25 DIAGNOSIS — E785 Hyperlipidemia, unspecified: Secondary | ICD-10-CM | POA: Diagnosis not present

## 2022-04-25 MED ORDER — IBANDRONATE SODIUM 150 MG PO TABS: 150.0000 mg | ORAL_TABLET | ORAL | 1 refills | Status: DC

## 2022-04-25 NOTE — Progress Notes (Signed)
[name] is a 71 y.o. female here for a follow up on osteoporosis.   History of Present Illness:   Chief Complaint  Patient presents with   Dexa results    Pt here to discuss Dexa results.    HPI  Osteoporosis  Patient here to discuss bone density test results. She had a Dexa scan on 04/23/2022 which showed FRAX of hip of 3.3%. She has been researching medication and is thinking about Boniva monthly medication. Currently taking Vitamin D and calcium supplements. At this time, she is interesting to trial medication for this issue. Has been following healthy diet. Denies any other concerns. Denies worsening joint pain.   Brittle Nails  Patient complain of dry brittle nails for a while. States her nails starting to "peel" recently. She is taking Collagen for this issue. Would like to know if this is helpful. She does admit she is allergic to nail polish. She has appointment with dermatologist in a month. Denies increased fatigue.   HLD Patient does have hx of this. She is currently taking Crestor 5 mg daily with no complications. She has been taking fish oil 1000 mg daily and would like to know if she should stopped taking this. Denies any other concerns.   Past Medical History:  Diagnosis Date   GERD (gastroesophageal reflux disease)    Hyperlipidemia      Social History   Tobacco Use   Smoking status: Former    Types: Cigarettes    Quit date: 05/23/1996    Years since quitting: 25.9   Smokeless tobacco: Never  Substance Use Topics   Alcohol use: No    Alcohol/week: 0.0 standard drinks of alcohol   Drug use: No    Past Surgical History:  Procedure Laterality Date   ABDOMINAL HYSTERECTOMY     APPENDECTOMY  2006   BALLOON DILATION N/A 11/25/2021   Procedure: BALLOON DILATION;  Surgeon: Thornton Park, MD;  Location: [facility];  Service: Gastroenterology;  Laterality: N/A;   BIOPSY  11/25/2021   Procedure: BIOPSY;  Surgeon: Thornton Park, MD;  Location: [facility];  Service: Gastroenterology;;   disc ectomy     ESOPHAGOGASTRODUODENOSCOPY (EGD) WITH PROPOFOL N/A 11/25/2021   Procedure: ESOPHAGOGASTRODUODENOSCOPY (EGD) WITH PROPOFOL;  Surgeon: Thornton Park, MD;  Location: [facility];  Service: Gastroenterology;  Laterality: N/A;   pheumothorax      PLEURAL SCARIFICATION     TONSILECTOMY/ADENOIDECTOMY WITH MYRINGOTOMY      Family History  Problem Relation Age of Onset   Heart disease Mother    Cancer Mother        breast cancer   Breast cancer Mother    Cancer Father        myo dysplasia   Hyperlipidemia Brother    Hypertension Brother    Mental illness Brother    Esophageal cancer Maternal Aunt    Cancer Maternal Grandmother        cervical or uterine   Cancer Paternal Grandmother        ovarian   Cancer Paternal Grandfather        prostate   Colon cancer Neg Hx    Pancreatic cancer Neg Hx     No Known Allergies  Current Medications:   Current Outpatient Medications:    aspirin 81 MG tablet, Take 81 mg by mouth daily., Disp: , Rfl:    Calcium Carbonate-Vitamin D (CALTRATE 600+D PO), Take 1 tablet by mouth daily., Disp: , Rfl:    diazepam (VALIUM) 5  MG tablet, Take 1 tablet (5 mg total) by mouth every 12 (twelve) hours as needed for anxiety. (Patient taking differently: Take 5 mg by mouth every 12 (twelve) hours as needed for anxiety Conservation officer, historic buildings Jason Fila).), Disp: 10 tablet, Rfl: 0   doxycycline (ADOXA) 50 MG tablet, Take 50 mg by mouth daily as needed (rosacea)., Disp: , Rfl:    Estradiol (YUVAFEM) 10 MCG TABS vaginal tablet, Place 1-2 tablets (10-20 mcg total) vaginally once a week., Disp: 24 tablet, Rfl: 1   metroNIDAZOLE (METROCREAM) 0.75 % cream, Apply 1 application topically once a week., Disp: , Rfl:    Misc Natural Products (GLUCOSAMINE CHOND COMPLEX/MSM) TABS, Take 1 tablet by mouth daily., Disp: , Rfl:    Multiple Vitamin (MULTIVITAMIN) capsule, Take 1 capsule by mouth daily., Disp: , Rfl:    Omega-3 Fatty Acids  (FISH OIL) 1000 MG CAPS, Take 2 each by mouth in the morning and at bedtime., Disp: , Rfl:    pantoprazole (PROTONIX) 40 MG tablet, Take 1 tablet (40 mg total) by mouth daily., Disp: 90 tablet, Rfl: 3   rosuvastatin (CRESTOR) 5 MG tablet, Take 1 tablet (5 mg total) by mouth daily., Disp: 90 tablet, Rfl: 3  Current Facility-Administered Medications:    0.9 %  sodium chloride infusion, 500 mL, Intravenous, Continuous, Beavers, Kimberly, MD   Review of Systems:   ROS Negative unless otherwise specified per HPI.   Vitals:   Vitals:   04/25/22 1350  BP: 110/70  Pulse: (!) 58  Temp: 98 F (36.7 C)  TempSrc: Temporal  SpO2: 97%  Weight: 125 lb (56.7 kg)  Height: '5\' 3"'$  (1.6 m)     Body mass index is 22.14 kg/m.  Physical Exam:   Physical Exam Constitutional:      Appearance: Normal appearance. She is well-developed.  HENT:     Head: Normocephalic and atraumatic.  Eyes:     General: Lids are normal.     Extraocular Movements: Extraocular movements intact.     Conjunctiva/sclera: Conjunctivae normal.  Pulmonary:     Effort: Pulmonary effort is normal.  Musculoskeletal:        General: Normal range of motion.     Cervical back: Normal range of motion and neck supple.  Skin:    General: Skin is warm and dry.  Neurological:     Mental Status: She is alert and oriented to person, place, and time.  Psychiatric:        Attention and Perception: Attention and perception normal.        Mood and Affect: Mood normal.        Behavior: Behavior normal.        Thought Content: Thought content normal.        Judgment: Judgment normal.     Assessment and Plan:   Osteoporosis without current pathological fracture, unspecified osteoporosis type Will trial monthly boniva 150 mg  Follow-up in 1 year, sooner if concerns Continue adequate calcium, vitamin D and weight bearing activities  Hyperlipidemia, mild Improving Recommend continue crestor 5 mg daily May stop fish  oil Follow-up in 1 year for re-check  Brittle nails Follow-up with dermatology Offered to check TSH and iron however she declined Follow-up after dermatology needed   I,Savera Zaman,acting as a scribe for [employer], PA.,have documented all relevant documentation on the behalf of Inda Coke, PA,as directed by  Inda Coke, PA while in the presence of Inda Coke, [place].   I, Inda Coke, [place], have reviewed all documentation for  this visit. The documentation on 04/25/22 for the exam, diagnosis, procedures, and orders are all accurate and complete.   Inda Coke, PA-C

## 2022-05-05 ENCOUNTER — Other Ambulatory Visit: Payer: Self-pay | Admitting: Physician Assistant

## 2022-05-05 DIAGNOSIS — Z1231 Encounter for screening mammogram for malignant neoplasm of breast: Secondary | ICD-10-CM

## 2022-05-15 ENCOUNTER — Ambulatory Visit
Admission: RE | Admit: 2022-05-15 | Discharge: 2022-05-15 | Disposition: A | Discharge status: Home / Self Care | Payer: PPO | Source: Ambulatory Visit | Attending: Physician Assistant | Admitting: Physician Assistant

## 2022-05-15 DIAGNOSIS — H2513 Age-related nuclear cataract, bilateral: Secondary | ICD-10-CM | POA: Diagnosis not present

## 2022-05-15 DIAGNOSIS — Z1231 Encounter for screening mammogram for malignant neoplasm of breast: Secondary | ICD-10-CM | POA: Diagnosis not present

## 2022-05-15 DIAGNOSIS — H52203 Unspecified astigmatism, bilateral: Secondary | ICD-10-CM | POA: Diagnosis not present

## 2022-05-16 DIAGNOSIS — L603 Nail dystrophy: Secondary | ICD-10-CM | POA: Diagnosis not present

## 2022-05-16 DIAGNOSIS — D2239 Melanocytic nevi of other parts of face: Secondary | ICD-10-CM | POA: Diagnosis not present

## 2022-05-16 DIAGNOSIS — D1801 Hemangioma of skin and subcutaneous tissue: Secondary | ICD-10-CM | POA: Diagnosis not present

## 2022-05-16 DIAGNOSIS — D2272 Melanocytic nevi of left lower limb, including hip: Secondary | ICD-10-CM | POA: Diagnosis not present

## 2022-05-16 DIAGNOSIS — Z85828 Personal history of other malignant neoplasm of skin: Secondary | ICD-10-CM | POA: Diagnosis not present

## 2022-05-16 DIAGNOSIS — L821 Other seborrheic keratosis: Secondary | ICD-10-CM | POA: Diagnosis not present

## 2022-05-16 DIAGNOSIS — D224 Melanocytic nevi of scalp and neck: Secondary | ICD-10-CM | POA: Diagnosis not present

## 2022-05-16 DIAGNOSIS — D22121 Melanocytic nevi of left upper eyelid, including canthus: Secondary | ICD-10-CM | POA: Diagnosis not present

## 2022-05-16 DIAGNOSIS — D2271 Melanocytic nevi of right lower limb, including hip: Secondary | ICD-10-CM | POA: Diagnosis not present

## 2022-06-16 ENCOUNTER — Encounter: Payer: Self-pay | Admitting: *Deleted

## 2022-06-19 ENCOUNTER — Telehealth: Payer: Self-pay | Admitting: Physician Assistant

## 2022-06-19 NOTE — Telephone Encounter (Signed)
Copied from [place] [id number]. Topic: Medicare AWV >> Jun 19, 2022 11:38 AM Devoria Glassing wrote: Reason for CRM: Left message for patient to schedule Annual Wellness Visit.  Please schedule with Nurse Health Advisor Charlott Rakes, RN at [facility]. This appt can be telephone or office visit. Please call [phone] ask for [facility]

## 2022-07-03 ENCOUNTER — Telehealth: Payer: Self-pay | Admitting: Physician Assistant

## 2022-07-03 NOTE — Telephone Encounter (Signed)
Copied from [place] [id number]. Topic: Medicare AWV >> Jul 03, 2022  1:15 PM Devoria Glassing wrote: Reason for CRM: Left message for patient to schedule Annual Wellness Visit.  Please schedule with Nurse Health Advisor Charlott Rakes, RN at [facility]. This appt can be telephone or office visit. Please call [phone] ask for [facility]

## 2022-07-04 ENCOUNTER — Encounter: Payer: Self-pay | Admitting: Physician Assistant

## 2022-07-04 ENCOUNTER — Other Ambulatory Visit: Payer: Self-pay

## 2022-07-04 MED ORDER — ROSUVASTATIN CALCIUM 5 MG PO TABS: 5.0000 mg | ORAL_TABLET | Freq: Every day | ORAL | 3 refills | Status: DC

## 2022-07-07 ENCOUNTER — Encounter: Payer: Self-pay | Admitting: Physician Assistant

## 2022-07-07 ENCOUNTER — Ambulatory Visit (INDEPENDENT_AMBULATORY_CARE_PROVIDER_SITE_OTHER): Payer: PPO

## 2022-07-07 DIAGNOSIS — Z0000 Encounter for general adult medical examination without abnormal findings: Secondary | ICD-10-CM | POA: Diagnosis not present

## 2022-07-07 NOTE — Patient Instructions (Signed)
[name] , Thank you for taking time to come for your Medicare Wellness Visit. I appreciate your ongoing commitment to your health goals. Please review the following plan we discussed and let me know if I can assist you in the future.   These are the goals we discussed:  Goals      Patient Stated     Maintain health         This is a list of the screening recommended for you and due dates:  Health Maintenance  Topic Date Due   Zoster (Shingles) Vaccine (1 of 2) Never done   COVID-19 Vaccine (5 - Pfizer series) 01/01/2022   Flu Shot  04/22/2022   Mammogram  05/15/2024   DEXA scan (bone density measurement)  04/23/2025   Tetanus Vaccine  10/08/2025   Colon Cancer Screening  11/21/2027   Pneumonia Vaccine  Completed   Hepatitis C Screening: USPSTF Recommendation to screen - Ages 18-79 yo.  Completed   HPV Vaccine  Aged Out    Advanced directives: Please bring a copy of your health care power of attorney and living will to the office at your convenience.  Conditions/risks identified: maintain health   Next appointment: Follow up in one year for your annual wellness visit    Preventive Care 65 Years and Older, Female Preventive care refers to lifestyle choices and visits with your health care provider that can promote health and wellness. What does preventive care include? A yearly physical exam. This is also called an annual well check. Dental exams once or twice a year. Routine eye exams. Ask your health care provider how often you should have your eyes checked. Personal lifestyle choices, including: Daily care of your teeth and gums. Regular physical activity. Eating a healthy diet. Avoiding tobacco and drug use. Limiting alcohol use. Practicing safe sex. Taking low-dose aspirin every day. Taking vitamin and mineral supplements as recommended by your health care provider. What happens during an annual well check? The services and screenings done by your health care  provider during your annual well check will depend on your age, overall health, lifestyle risk factors, and family history of disease. Counseling  Your health care provider may ask you questions about your: Alcohol use. Tobacco use. Drug use. Emotional well-being. Home and relationship well-being. Sexual activity. Eating habits. History of falls. Memory and ability to understand (cognition). Work and work Statistician. Reproductive health. Screening  You may have the following tests or measurements: Height, weight, and BMI. Blood pressure. Lipid and cholesterol levels. These may be checked every 5 years, or more frequently if you are over 8 years old. Skin check. Lung cancer screening. You may have this screening every year starting at age 78 if you have a 30-pack-year history of smoking and currently smoke or have quit within the past 15 years. Fecal occult blood test (FOBT) of the stool. You may have this test every year starting at age 50. Flexible sigmoidoscopy or colonoscopy. You may have a sigmoidoscopy every 5 years or a colonoscopy every 10 years starting at age 21. Hepatitis C blood test. Hepatitis B blood test. Sexually transmitted disease (STD) testing. Diabetes screening. This is done by checking your blood sugar (glucose) after you have not eaten for a while (fasting). You may have this done every 1-3 years. Bone density scan. This is done to screen for osteoporosis. You may have this done starting at age 24. Mammogram. This may be done every 1-2 years. Talk to your health care  provider about how often you should have regular mammograms. Talk with your health care provider about your test results, treatment options, and if necessary, the need for more tests. Vaccines  Your health care provider may recommend certain vaccines, such as: Influenza vaccine. This is recommended every year. Tetanus, diphtheria, and acellular pertussis (Tdap, Td) vaccine. You may need a Td  booster every 10 years. Zoster vaccine. You may need this after age 54. Pneumococcal 13-valent conjugate (PCV13) vaccine. One dose is recommended after age 9. Pneumococcal polysaccharide (PPSV23) vaccine. One dose is recommended after age 10. Talk to your health care provider about which screenings and vaccines you need and how often you need them. This information is not intended to replace advice given to you by your health care provider. Make sure you discuss any questions you have with your health care provider. Document Released: 10/05/2015 Document Revised: 05/28/2016 Document Reviewed: 07/10/2015 Elsevier Interactive Patient Education  2017 [place] Prevention in the Home Falls can cause injuries. They can happen to people of all ages. There are many things you can do to make your home safe and to help prevent falls. What can I do on the outside of my home? Regularly fix the edges of walkways and driveways and fix any cracks. Remove anything that might make you trip as you walk through a door, such as a raised step or threshold. Trim any bushes or trees on the path to your home. Use bright outdoor lighting. Clear any walking paths of anything that might make someone trip, such as rocks or tools. Regularly check to see if handrails are loose or broken. Make sure that both sides of any steps have handrails. Any raised decks and porches should have guardrails on the edges. Have any leaves, snow, or ice cleared regularly. Use sand or salt on walking paths during winter. Clean up any spills in your garage right away. This includes oil or grease spills. What can I do in the bathroom? Use night lights. Install grab bars by the toilet and in the tub and shower. Do not use towel bars as grab bars. Use non-skid mats or decals in the tub or shower. If you need to sit down in the shower, use a plastic, non-slip stool. Keep the floor dry. Clean up any water that spills on the floor  as soon as it happens. Remove soap buildup in the tub or shower regularly. Attach bath mats securely with double-sided non-slip rug tape. Do not have throw rugs and other things on the floor that can make you trip. What can I do in the bedroom? Use night lights. Make sure that you have a light by your bed that is easy to reach. Do not use any sheets or blankets that are too big for your bed. They should not hang down onto the floor. Have a firm chair that has side arms. You can use this for support while you get dressed. Do not have throw rugs and other things on the floor that can make you trip. What can I do in the kitchen? Clean up any spills right away. Avoid walking on wet floors. Keep items that you use a lot in easy-to-reach places. If you need to reach something above you, use a strong step stool that has a grab bar. Keep electrical cords out of the way. Do not use floor polish or wax that makes floors slippery. If you must use wax, use non-skid floor wax. Do not have throw rugs  and other things on the floor that can make you trip. What can I do with my stairs? Do not leave any items on the stairs. Make sure that there are handrails on both sides of the stairs and use them. Fix handrails that are broken or loose. Make sure that handrails are as long as the stairways. Check any carpeting to make sure that it is firmly attached to the stairs. Fix any carpet that is loose or worn. Avoid having throw rugs at the top or bottom of the stairs. If you do have throw rugs, attach them to the floor with carpet tape. Make sure that you have a light switch at the top of the stairs and the bottom of the stairs. If you do not have them, ask someone to add them for you. What else can I do to help prevent falls? Wear shoes that: Do not have high heels. Have rubber bottoms. Are comfortable and fit you well. Are closed at the toe. Do not wear sandals. If you use a stepladder: Make sure that it is  fully opened. Do not climb a closed stepladder. Make sure that both sides of the stepladder are locked into place. Ask someone to hold it for you, if possible. Clearly mark and make sure that you can see: Any grab bars or handrails. First and last steps. Where the edge of each step is. Use tools that help you move around (mobility aids) if they are needed. These include: Canes. Walkers. Scooters. Crutches. Turn on the lights when you go into a dark area. Replace any light bulbs as soon as they burn out. Set up your furniture so you have a clear path. Avoid moving your furniture around. If any of your floors are uneven, fix them. If there are any pets around you, be aware of where they are. Review your medicines with your doctor. Some medicines can make you feel dizzy. This can increase your chance of falling. Ask your doctor what other things that you can do to help prevent falls. This information is not intended to replace advice given to you by your health care provider. Make sure you discuss any questions you have with your health care provider. Document Released: 07/05/2009 Document Revised: 02/14/2016 Document Reviewed: 10/13/2014 Elsevier Interactive Patient Education  2017 [employer].

## 2022-07-07 NOTE — Progress Notes (Signed)
I connected with  [name] on 07/07/22 by a audio enabled telemedicine application and verified that I am speaking with the correct person using two identifiers.  Patient Location: Home  Provider Location: Office/Clinic  I discussed the limitations of evaluation and management by telemedicine. The patient expressed understanding and agreed to proceed.   Subjective:   [name] is a 71 y.o. female who presents for Medicare Annual (Subsequent) preventive examination.  Review of Systems     Cardiac Risk Factors include: advanced age (>56mn, >>46women);dyslipidemia     Objective:    There were no vitals filed for this visit. There is no height or weight on file to calculate BMI.     07/07/2022    8:11 AM 11/25/2021    7:12 AM 10/14/2021    7:37 PM 07/21/2021    8:13 AM 06/20/2021    3:21 PM  Advanced Directives  Does Patient Have a Medical Advance Directive? Yes No No Yes Yes  Type of [place]aramed[place]of [place] will   [place] will [place]Living will  Does patient want to make changes to medical advance directive?    No - Patient declined No - Patient declined  Copy of HBeverlyin Chart? No - copy requested   Yes - validated most recent copy scanned in chart (See row information) Yes - validated most recent copy scanned in chart (See row information)  Would patient like information on creating a medical advance directive?  No - Patient declined No - Patient declined      Current Medications (verified) Outpatient Encounter Medications as of 07/07/2022  Medication Sig   aspirin 81 MG tablet Take 81 mg by mouth daily.   Calcium Carbonate-Vitamin D (CALTRATE 600+D PO) Take 1 tablet by mouth daily.   diazepam (VALIUM) 5 MG tablet Take 1 tablet (5 mg total) by mouth every 12 (twelve) hours as needed for anxiety. (Patient taking differently: Take 5 mg by mouth every 12 (twelve)  hours as needed for anxiety (Conservation officer, historic buildings/Jason Fila.)   doxycycline (ADOXA) 50 MG tablet Take 50 mg by mouth daily as needed (rosacea).   Estradiol (YUVAFEM) 10 MCG TABS vaginal tablet Place 1-2 tablets (10-20 mcg total) vaginally once a week.   ibandronate (BONIVA) 150 MG tablet Take 1 tablet (150 mg total) by mouth every 30 (thirty) days. Take in the morning with a full glass of water, on an empty stomach, and do not take anything else by mouth or lie down for the next 30 min.   metroNIDAZOLE (METROCREAM) 0.75 % cream Apply 1 application  topically as needed.   Multiple Vitamin (MULTIVITAMIN) capsule Take 1 capsule by mouth daily.   pantoprazole (PROTONIX) 40 MG tablet Take 1 tablet (40 mg total) by mouth daily.   rosuvastatin (CRESTOR) 5 MG tablet Take 1 tablet (5 mg total) by mouth daily.   TURMERIC PO Take by mouth.   [DISCONTINUED] Misc Natural Products (GLUCOSAMINE CHOND COMPLEX/MSM) TABS Take 1 tablet by mouth daily.   [DISCONTINUED] Omega-3 Fatty Acids (FISH OIL) 1000 MG CAPS Take 2 each by mouth in the morning and at bedtime.   Facility-Administered Encounter Medications as of 07/07/2022  Medication   0.9 %  sodium chloride infusion    Allergies (verified) Patient has no known allergies.   History: Past Medical History:  Diagnosis Date   GERD (gastroesophageal reflux disease)    Hyperlipidemia    Past Surgical History:  Procedure  Laterality Date   ABDOMINAL HYSTERECTOMY     APPENDECTOMY  2006   BALLOON DILATION N/A 11/25/2021   Procedure: BALLOON DILATION;  Surgeon: Thornton Park, MD;  Location: [facility];  Service: Gastroenterology;  Laterality: N/A;   BIOPSY  11/25/2021   Procedure: BIOPSY;  Surgeon: Thornton Park, MD;  Location: [facility];  Service: Gastroenterology;;   disc ectomy     ESOPHAGOGASTRODUODENOSCOPY (EGD) WITH PROPOFOL N/A 11/25/2021   Procedure: ESOPHAGOGASTRODUODENOSCOPY (EGD) WITH PROPOFOL;  Surgeon: Thornton Park, MD;  Location: [facility];   Service: Gastroenterology;  Laterality: N/A;   pheumothorax      PLEURAL SCARIFICATION     TONSILECTOMY/ADENOIDECTOMY WITH MYRINGOTOMY     Family History  Problem Relation Age of Onset   Heart disease Mother    Cancer Mother        breast cancer   Breast cancer Mother    Cancer Father        myo dysplasia   Hyperlipidemia Brother    Hypertension Brother    Mental illness Brother    Esophageal cancer Maternal Aunt    Cancer Maternal Grandmother        cervical or uterine   Cancer Paternal Grandmother        ovarian   Cancer Paternal Grandfather        prostate   Colon cancer Neg Hx    Pancreatic cancer Neg Hx    Social History   Socioeconomic History   Marital status: Married    Spouse name: Kennley Schwandt   Number of children: Not on file   Years of education: Not on file   Highest education level: Not on file  Occupational History   Occupation: Retired  Tobacco Use   Smoking status: Former    Types: Cigarettes    Quit date: 05/23/1996    Years since quitting: 26.1   Smokeless tobacco: Never  Substance and Sexual Activity   Alcohol use: No    Alcohol/week: 0.0 standard drinks of alcohol   Drug use: No   Sexual activity: Yes  Other Topics Concern   Not on file  Social History Narrative   Married   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 [place] Strain: [place]  (07/07/2022)   Overall Financial Resource Strain ([place])    Difficulty of Paying Living Expenses: Not hard at all  Food Insecurity: No Food Insecurity (07/07/2022)   Hunger Vital Sign    Worried About Running Out of Food in the Last Year: Never true    [place] in the Last Year: Never true  Transportation Needs: No Transportation Needs (07/07/2022)   PRAPARE - Hydrologist (Medical): No    Lack of Transportation (Non-Medical): No  Physical Activity: Sufficiently Active  (07/07/2022)   Exercise Vital Sign    Days of Exercise per Week: 5 days    Minutes of Exercise per Session: 40 min  Stress: No Stress Concern Present (07/07/2022)   [place]    Feeling of Stress : Not at all  Social Connections: Moderately Integrated (07/07/2022)   Social Connection and Isolation Panel [NHANES]    Frequency of Communication with Friends and Family: More than three times a week    Frequency of Social Gatherings with Friends and Family: More than three times a week    Attends  Religious Services: Never    Active Member of Clubs or Organizations: Yes    Attends Archivist Meetings: 1 to 4 times per year    Marital Status: Married    Tobacco Counseling Counseling given: Not Answered   Clinical Intake:  Pre-visit preparation completed: Yes  Pain : No/denies pain     Nutritional Risks: None Diabetes: No  How often do you need to have someone help you when you read instructions, pamphlets, or other written materials from your doctor or pharmacy?: 1 - Never  Diabetic?no  Interpreter Needed?: No  Information entered by :: Charlott Rakes, LPN   Activities of Daily Living    07/07/2022    8:12 AM  In your present state of health, do you have any difficulty performing the following activities:  Hearing? 0  Vision? 0  Difficulty concentrating or making decisions? 0  Walking or climbing stairs? 0  Dressing or bathing? 0  Doing errands, shopping? 0  Preparing Food and eating ? N  Using the Toilet? N  In the past six months, have you accidently leaked urine? Y  Comment at times, however I do kegel exercise  Do you have problems with loss of bowel control? N  Managing your Medications? N  Managing your Finances? N  Housekeeping or managing your Housekeeping? N    Patient Care Team: Inda Coke, [place] as PCP - General (Physician Assistant) Allyn Kenner, DO as Consulting  Physician (Obstetrics and Gynecology) Rolm Bookbinder, MD as Consulting Physician (Dermatology)  Indicate any recent Medical Services you may have received from other than [facility] providers in the past year (date may be approximate).     Assessment:   This is a routine wellness examination for [place].  Hearing/Vision screen Hearing Screening - Comments:: Pt denies any hearing issues  Vision Screening - Comments:: Pt follows up with Dr Prudencio Burly for annual eye exams   Dietary issues and exercise activities discussed: Current Exercise Habits: Home exercise routine, Type of exercise: walking, Time (Minutes): 40, Frequency (Times/Week): 5, Weekly Exercise (Minutes/Week): 200   Goals Addressed             This Visit's Progress    Patient Stated       Maintain health        Depression Screen    07/07/2022    8:10 AM 06/20/2021    3:22 PM 06/20/2021    3:18 PM 06/04/2021    2:24 PM 12/03/2020    1:36 PM 03/21/2020   12:59 PM 06/30/2018    1:58 PM  PHQ 2/9 Scores  PHQ - 2 Score 0 0 0 0 0 0 0    Fall Risk    07/07/2022    8:12 AM 06/20/2021    3:21 PM 12/03/2020    1:37 PM 06/30/2018    1:58 PM 09/09/2016   11:08 AM  [place] in the past year? 0 0 0 No No  Number falls in past yr: 0 0     Injury with Fall? 0 0     Risk for fall due to : Impaired vision No Fall Risks     Follow up Falls prevention discussed        FALL RISK PREVENTION PERTAINING TO THE HOME:  Any stairs in or around the home? Yes  If so, are there any without handrails? No  Home free of loose throw rugs in walkways, pet beds, electrical cords, etc? Yes  Adequate lighting in your home  to reduce risk of falls? Yes   ASSISTIVE DEVICES UTILIZED TO PREVENT FALLS:  Life alert? No  Use of a cane, walker or w/c? No  Grab bars in the bathroom? Yes  Shower chair or bench in shower? No  Elevated toilet seat or a handicapped toilet? No   TIMED UP AND GO:  Was the test performed? No .   Cognitive  Function:        07/07/2022    8:14 AM 06/20/2021    3:23 PM  6CIT Screen  What Year? 0 points 0 points  What month? 0 points 0 points  What time? 0 points 0 points  Count back from 20 0 points 0 points  Months in reverse 0 points 0 points  Repeat phrase 0 points 0 points  Total Score 0 points 0 points    Immunizations Immunization History  Administered Date(s) Administered   Fluad Quad(high Dose 65+) 06/04/2021   Influenza Inj Mdck Quad With Preservative 06/22/2017, 06/22/2018   Influenza Split 06/07/2012   Influenza, High Dose Seasonal PF 06/30/2018, 06/17/2020   Influenza,inj,Quad PF,6+ Mos 08/14/2014, 06/18/2017   Influenza-Unspecified 07/24/2015, 05/23/2016, 06/22/2016   PFIZER(Purple Top)SARS-COV-2 Vaccination 10/12/2019, 11/02/2019, 06/29/2020   Pfizer Covid-19 Vaccine Bivalent Booster 76yr & up 09/02/2021   Pneumococcal Conjugate-13 06/30/2018   Pneumococcal Polysaccharide-23 03/21/2020   Tdap 10/09/2015    TDAP status: Up to date  Flu Vaccine status: Due, Education has been provided regarding the importance of this vaccine. Advised may receive this vaccine at local pharmacy or Health Dept. Aware to provide a copy of the vaccination record if obtained from local pharmacy or Health Dept. Verbalized acceptance and understanding.  Pneumococcal vaccine status: Up to date  Covid-19 vaccine status: Completed vaccines  Qualifies for Shingles Vaccine? Yes   Zostavax completed Yes   Shingrix Completed?: Yes, pt will bring in dates at next visit  Screening Tests Health Maintenance  Topic Date Due   Zoster Vaccines- Shingrix (1 of 2) Never done   COVID-19 Vaccine (5 - Pfizer series) 01/01/2022   INFLUENZA VACCINE  04/22/2022   MAMMOGRAM  05/15/2024   DEXA SCAN  04/23/2025   TETANUS/TDAP  10/08/2025   COLONOSCOPY (Pts 45-429yrInsurance coverage will need to be confirmed)  11/21/2027   Pneumonia Vaccine 689Years old  Completed   Hepatitis C Screening  Completed    HPV VACCINES  Aged Out    Health Maintenance  Health Maintenance Due  Topic Date Due   Zoster Vaccines- Shingrix (1 of 2) Never done   COVID-19 Vaccine (5 - Pfizer series) 01/01/2022   INFLUENZA VACCINE  04/22/2022    Colorectal cancer screening: Type of screening: Colonoscopy. Completed 11/20/17. Repeat every 10 years  Mammogram status: Completed 05/15/22. Repeat every year  Bone Density status: Completed 04/23/22. Results reflect: Bone density results: OSTEOPOROSIS. Repeat every 3 years.   Additional Screening:  Hepatitis C Screening:  Completed 10/09/15  Vision Screening: Recommended annual ophthalmology exams for early detection of glaucoma and other disorders of the eye. Is the patient up to date with their annual eye exam?  Yes  Who is the provider or what is the name of the office in which the patient attends annual eye exams? Dr LyPrudencio BurlyIf pt is not established with a provider, would they like to be referred to a provider to establish care? No .   Dental Screening: Recommended annual dental exams for proper oral hygiene  Community Resource Referral / Chronic Care Management: CRR required this visit?  No   CCM required this visit?  No      Plan:     I have personally reviewed and noted the following in the patient's chart:   Medical and social history Use of alcohol, tobacco or illicit drugs  Current medications and supplements including opioid prescriptions. Patient is not currently taking opioid prescriptions. Functional ability and status Nutritional status Physical activity Advanced directives List of other physicians Hospitalizations, surgeries, and ER visits in previous 12 months Vitals Screenings to include cognitive, depression, and falls Referrals and appointments  In addition, I have reviewed and discussed with patient certain preventive protocols, quality metrics, and best practice recommendations. A written personalized care plan for preventive  services as well as general preventive health recommendations were provided to patient.     Willette Brace, LPN   [id number]/02/[id number]   Nurse Notes: none

## 2022-07-30 ENCOUNTER — Encounter: Payer: Self-pay | Admitting: Physician Assistant

## 2022-07-30 MED ORDER — ESTRADIOL 10 MCG VA TABS: 1.0000 | ORAL_TABLET | VAGINAL | 1 refills | Status: DC

## 2023-01-09 ENCOUNTER — Ambulatory Visit (INDEPENDENT_AMBULATORY_CARE_PROVIDER_SITE_OTHER): Payer: PPO | Admitting: Physician Assistant

## 2023-01-09 ENCOUNTER — Encounter: Payer: Self-pay | Admitting: Physician Assistant

## 2023-01-09 VITALS — BP 100/64 | HR 64 | Temp 97.5°F | Ht 63.0 in | Wt 125.4 lb

## 2023-01-09 DIAGNOSIS — M810 Age-related osteoporosis without current pathological fracture: Secondary | ICD-10-CM | POA: Diagnosis not present

## 2023-01-09 DIAGNOSIS — E785 Hyperlipidemia, unspecified: Secondary | ICD-10-CM | POA: Diagnosis not present

## 2023-01-09 DIAGNOSIS — E538 Deficiency of other specified B group vitamins: Secondary | ICD-10-CM

## 2023-01-09 DIAGNOSIS — Z833 Family history of diabetes mellitus: Secondary | ICD-10-CM

## 2023-01-09 DIAGNOSIS — Z0000 Encounter for general adult medical examination without abnormal findings: Secondary | ICD-10-CM

## 2023-01-09 DIAGNOSIS — E559 Vitamin D deficiency, unspecified: Secondary | ICD-10-CM

## 2023-01-09 DIAGNOSIS — L603 Nail dystrophy: Secondary | ICD-10-CM

## 2023-01-09 NOTE — Addendum Note (Signed)
Addended by: Laddie Aquas A on: 01/09/2023 02:55 PM   Modules accepted: Orders

## 2023-01-09 NOTE — Progress Notes (Signed)
Subjective:    [name] is a 72 y.o. female and is here for a comprehensive physical exam.  HPI  There are no preventive care reminders to display for this patient.  Acute Concerns: Thyroid Patient is requesting her thyroid levels be checked today because she has brittle fingernails.   Chronic Issues: Osteoporosis  Patient is complaint with monthly 150 mg boniva. She states initially she would experience a burning sensation in her stomach, but that is no longer the case now.   Health Maintenance: Colonoscopy -- last completed on 08/03/2017. Mammogram -- last completed on 05/15/2022. PAP -- last completed on 01/29/2016. Bone Density -- last completed on 04/23/2022. Diet -- Patient maintains a well balanced diet. Exercise -- Patient participates in regular exercise.  Sleep habits -- She reports to have trouble sleeping. Mood -- She is in a stable mood today.  UTD with dentist? - She is UTD on dental care. UTD with eye doctor? - She is UTD on vision care.  Weight history: Wt Readings from Last 10 Encounters:  04/25/22 125 lb (56.7 kg)  01/20/22 127 lb (57.6 kg)  01/14/22 127 lb 12.8 oz (58 kg)  01/14/22 125 lb 12.8 oz (57.1 kg)  12/31/21 125 lb 12.8 oz (57.1 kg)  11/25/21 123 lb 14.4 oz (56.2 kg)  10/15/21 124 lb (56.2 kg)  10/14/21 125 lb (56.7 kg)  09/30/21 125 lb (56.7 kg)  09/27/21 125 lb (56.7 kg)   There is no height or weight on file to calculate BMI. No LMP recorded. Patient is postmenopausal.  Alcohol use:  reports no history of alcohol use.  Tobacco use:  Tobacco Use: Medium Risk (07/07/2022)   Patient History    Smoking Tobacco Use: Former    Smokeless Tobacco Use: Never    Passive Exposure: Not on file   Eligible for lung cancer screening? no     07/07/2022    8:10 AM  Depression screen PHQ 2/9  Decreased Interest 0  Down, Depressed, Hopeless 0  PHQ - 2 Score 0     Other providers/specialists: Patient Care Team: Jarold Motto, [place]  as PCP - General (Physician Assistant) Philip Aspen, DO as Consulting Physician (Obstetrics and Gynecology) Venancio Poisson, MD as Consulting Physician (Dermatology)    PMHx, SurgHx, SocialHx, Medications, and Allergies were reviewed in the Visit Navigator and updated as appropriate.   Past Medical History:  Diagnosis Date   GERD (gastroesophageal reflux disease)    Hyperlipidemia      Past Surgical History:  Procedure Laterality Date   ABDOMINAL HYSTERECTOMY     APPENDECTOMY  2006   BALLOON DILATION N/A 11/25/2021   Procedure: BALLOON DILATION;  Surgeon: Tressia Danas, MD;  Location: [facility];  Service: Gastroenterology;  Laterality: N/A;   BIOPSY  11/25/2021   Procedure: BIOPSY;  Surgeon: Tressia Danas, MD;  Location: [facility];  Service: Gastroenterology;;   disc ectomy     ESOPHAGOGASTRODUODENOSCOPY (EGD) WITH PROPOFOL N/A 11/25/2021   Procedure: ESOPHAGOGASTRODUODENOSCOPY (EGD) WITH PROPOFOL;  Surgeon: Tressia Danas, MD;  Location: [facility];  Service: Gastroenterology;  Laterality: N/A;   pheumothorax      PLEURAL SCARIFICATION     TONSILECTOMY/ADENOIDECTOMY WITH MYRINGOTOMY       Family History  Problem Relation Age of Onset   Heart disease Mother    Cancer Mother        breast cancer   Breast cancer Mother    Cancer Father        myo dysplasia  Hyperlipidemia Brother    Hypertension Brother    Mental illness Brother    Esophageal cancer Maternal Aunt    Cancer Maternal Grandmother        cervical or uterine   Cancer Paternal Grandmother        ovarian   Cancer Paternal Grandfather        prostate   Colon cancer Neg Hx    Pancreatic cancer Neg Hx     Social History   Tobacco Use   Smoking status: Former    Types: Cigarettes    Quit date: 05/23/1996    Years since quitting: 26.6   Smokeless tobacco: Never  Substance Use Topics   Alcohol use: No    Alcohol/week: 0.0 standard drinks of alcohol   Drug use: No    Review of Systems:    Review of Systems  Constitutional:  Negative for chills, fever, malaise/fatigue and weight loss.  HENT:  Negative for hearing loss, sinus pain and sore throat.   Respiratory:  Negative for cough and hemoptysis.   Cardiovascular:  Negative for chest pain, palpitations, leg swelling and PND.  Gastrointestinal:  Negative for abdominal pain, constipation, diarrhea, heartburn, nausea and vomiting.  Genitourinary:  Negative for dysuria, frequency and urgency.  Musculoskeletal:  Negative for back pain, myalgias and neck pain.  Skin:  Negative for itching and rash.  Endo/Heme/Allergies:  Negative for polydipsia.  Psychiatric/Behavioral:  Negative for depression. The patient is not nervous/anxious.     Objective:   There were no vitals taken for this visit. There is no height or weight on file to calculate BMI.   General Appearance:    Alert, cooperative, no distress, appears stated age  Head:    Normocephalic, without obvious abnormality, atraumatic  Eyes:    PERRL, conjunctiva/corneas clear, EOM's intact, fundi    benign, both eyes  Ears:    Normal TM's and external ear canals, both ears  Nose:   Nares normal, septum midline, mucosa normal, no drainage    or sinus tenderness  Throat:   Lips, mucosa, and tongue normal; teeth and gums normal  Neck:   Supple, symmetrical, trachea midline, no adenopathy;    thyroid:  no enlargement/tenderness/nodules; no carotid   bruit or JVD  Back:     Symmetric, no curvature, ROM normal, no CVA tenderness  Lungs:     Clear to auscultation bilaterally, respirations unlabored  Chest Wall:    No tenderness or deformity   Heart:    Regular rate and rhythm, S1 and S2 normal, no murmur, rub or gallop  Breast Exam:    Deferred  Abdomen:     Soft, non-tender, bowel sounds active all four quadrants,    no masses, no organomegaly  Genitalia:    Deferred  Extremities:   Extremities normal, atraumatic, no cyanosis or edema  Pulses:   2+ and symmetric all  extremities  Skin:   Skin color, texture, turgor normal, no rashes or lesions  Lymph nodes:   Cervical, supraclavicular, and axillary nodes normal  Neurologic:   CNII-XII intact, normal strength, sensation and reflexes    throughout    Assessment/Plan:   Routine physical examination Today patient counseled on age appropriate routine health concerns for screening and prevention, each reviewed and up to date or declined. Immunizations reviewed and up to date or declined. Labs ordered and reviewed. Risk factors for depression reviewed and negative. Hearing function and visual acuity are intact. ADLs screened and addressed as needed. Functional ability and  level of safety reviewed and appropriate. Education, counseling and referrals performed based on assessed risks today. Patient provided with a copy of personalized plan for preventive services.   Vitamin D deficiency Update Vit D and provide recommendations accordingly  Hyperlipidemia, mild Update lipid panel and provide recommendations accordingly  Osteoporosis without current pathological fracture, unspecified osteoporosis type Compliant with boniva Too soon to update DEXA Continue caltrate and exercise  Family history of diabetes mellitus Update A1c per patient request  Brittle nails Update TSH per patient request  Vitamin B12 deficiency Update B12 and provide recommendations accordingly    I,Verona Buck,acting as a scribe for [employer], PA.,have documented all relevant documentation on the behalf of Jarold Motto, PA,as directed by  Jarold Motto, PA while in the presence of Jarold Motto, [place].  I, Jarold Motto, [place], have reviewed all documentation for this visit. The documentation on 01/09/23 for the exam, diagnosis, procedures, and orders are all accurate and complete.   Jarold Motto, PA-C [facility]

## 2023-01-09 NOTE — Patient Instructions (Signed)
It was great to see you! ? ?Please go to the lab for blood work.  ? ?Our office will call you with your results unless you have chosen to receive results via MyChart. ? ?If your blood work is normal we will follow-up each year for physicals and as scheduled for chronic medical problems. ? ?If anything is abnormal we will treat accordingly and get you in for a follow-up. ? ?Take care, ? ?Hollie Bartus ?  ? ? ?

## 2023-01-10 LAB — COMPREHENSIVE METABOLIC PANEL
AG Ratio: 1.5 (calc) (ref 1.0–2.5)
ALT: 13 U/L (ref 6–29)
AST: 19 U/L (ref 10–35)
Albumin: 4.1 g/dL (ref 3.6–5.1)
Alkaline phosphatase (APISO): 56 U/L (ref 37–153)
BUN: 16 mg/dL (ref 7–25)
CO2: 27 mmol/L (ref 20–32)
Calcium: 9.4 mg/dL (ref 8.6–10.4)
Chloride: 103 mmol/L (ref 98–110)
Creat: 0.8 mg/dL (ref 0.50–1.05)
Globulin: 2.8 g/dL (calc) (ref 1.9–3.7)
Glucose, Bld: 97 mg/dL (ref 65–99)
Potassium: 4.2 mmol/L (ref 3.5–5.3)
Sodium: 138 mmol/L (ref 135–146)
Total Bilirubin: 0.3 mg/dL (ref 0.2–1.2)
Total Protein: 6.9 g/dL (ref 6.1–8.1)

## 2023-01-10 LAB — VITAMIN B12: Vitamin B-12: 760 pg/mL (ref 200–1100)

## 2023-01-10 LAB — CBC WITH DIFFERENTIAL/PLATELET
Absolute Monocytes: 669 cells/uL (ref 200–950)
Basophils Absolute: 41 cells/uL (ref 0–200)
Basophils Relative: 0.6 %
Eosinophils Absolute: 152 cells/uL (ref 15–500)
Eosinophils Relative: 2.2 %
HCT: 35.5 % (ref 35.0–45.0)
Hemoglobin: 11.9 g/dL (ref 11.7–15.5)
Lymphs Abs: 2208 cells/uL (ref 850–3900)
MCH: 27.5 pg (ref 27.0–33.0)
MCHC: 33.5 g/dL (ref 32.0–36.0)
MCV: 82 fL (ref 80.0–100.0)
MPV: 9.6 fL (ref 7.5–12.5)
Monocytes Relative: 9.7 %
Neutro Abs: 3830 cells/uL (ref 1500–7800)
Neutrophils Relative %: 55.5 %
Platelets: 322 10*3/uL (ref 140–400)
RBC: 4.33 10*6/uL (ref 3.80–5.10)
RDW: 13.3 % (ref 11.0–15.0)
Total Lymphocyte: 32 %
WBC: 6.9 10*3/uL (ref 3.8–10.8)

## 2023-01-10 LAB — LIPID PANEL
Cholesterol: 164 mg/dL (ref <200)
HDL: 61 mg/dL (ref >=50)
LDL Cholesterol (Calc): 78 mg/dL (calc)
Non-HDL Cholesterol (Calc): 103 mg/dL (calc) (ref <130)
Total CHOL/HDL Ratio: 2.7 (calc) (ref <5.0)
Triglycerides: 155 mg/dL — ABNORMAL HIGH (ref <150)

## 2023-01-10 LAB — VITAMIN D 25 HYDROXY (VIT D DEFICIENCY, FRACTURES): Vit D, 25-Hydroxy: 55 ng/mL (ref 30–100)

## 2023-01-10 LAB — HEMOGLOBIN A1C
Hgb A1c MFr Bld: 5.8 % of total Hgb — ABNORMAL HIGH (ref <5.7)
Mean Plasma Glucose: 120 mg/dL
eAG (mmol/L): 6.6 mmol/L

## 2023-01-10 LAB — TSH: TSH: 1.47 mIU/L (ref 0.40–4.50)

## 2023-01-24 ENCOUNTER — Other Ambulatory Visit: Payer: Self-pay | Admitting: Gastroenterology

## 2023-01-24 DIAGNOSIS — K219 Gastro-esophageal reflux disease without esophagitis: Secondary | ICD-10-CM

## 2023-03-02 NOTE — Progress Notes (Signed)
[facility] New Patient Evaluation and Consultation  Referring Provider: Jarold Motto, [place] PCP: Jarold Motto, PA Date of Service: 03/06/2023  SUBJECTIVE Chief Complaint: New Patient (Initial Visit) [name] is a 72 y.o. female is here for pelvic floor pain. Also patient c/o incontinence.)  History of Present Illness: [name] is a 72 y.o. White or Caucasian female seen in consultation at the request of Dr. Bufford Buttner for evaluation of Pelvic floor pain.    Review of records significant for: A1c 5.8 (01/09/2023)  CT Abdomen Pelvic W/ Contrast (07/02/17)  Adrenals/Urinary Tract: Adrenal glands are unremarkable. Kidneys are normal, without renal calculi, focal lesion, or hydronephrosis. Bladder is unremarkable.   Stomach/Bowel: The stomach appears normal. There is no evidence of bowel obstruction or inflammation. Status post appendectomy.   Vascular/Lymphatic: Aortic atherosclerosis. No enlarged abdominal or pelvic lymph nodes.   Reproductive: Status post hysterectomy. No adnexal masses.   Urinary Symptoms: Leaks urine with cough/ sneeze and with urgency Leaks a few time(s) per weeks.  Pad use: None  She is bothered by her UI symptoms.  Has lock and key effect  Day time voids 5-6.  Nocturia: 2 times per night to void. Voiding dysfunction: she empties her bladder well.  does not use a catheter to empty bladder.  When urinating, she feels a weak stream, dribbling after finishing, and to push on her belly or vagina to empty bladder Drinks: 8oz Decaf Tea, 8oz Decaf coffee, 48oz Water  per day  UTIs: 0 UTI's in the last year.   Denies history of blood in urine, kidney or bladder stones, pyelonephritis, bladder cancer, and kidney cancer  Pelvic Organ Prolapse Symptoms:                  She Denies a feeling of a bulge the vaginal area. She Denies seeing a bulge.    Bowel Symptom: Bowel movements: 1 time(s) per day Stool consistency: soft   Straining: no.  Splinting: no.  Incomplete evacuation: no.  She Denies accidental bowel leakage / fecal incontinence Bowel regimen: none Last colonoscopy: Date 2018, Results WNL  Sexual Function Sexually active: no.  Sexual orientation: Straight Pain with sex: Yes, deep in the pelvis, has discomfort due to dryness  Pelvic Pain Admits to pelvic pain Location: Hips and deeper in pelvis Pain occurs: Often (Walking, lifting, sitting) Prior pain treatment: None Improved by: Nothing Worsened by: Lifting   Past Medical History:  Past Medical History:  Diagnosis Date   Anxiety    When I fly   GERD (gastroesophageal reflux disease)    Hyperlipidemia    Osteoporosis    osteoporosis     Past Surgical History:   Past Surgical History:  Procedure Laterality Date   ABDOMINAL HYSTERECTOMY     APPENDECTOMY  2006   BALLOON DILATION N/A 11/25/2021   Procedure: BALLOON DILATION;  Surgeon: Tressia Danas, MD;  Location: [facility];  Service: Gastroenterology;  Laterality: N/A;   BIOPSY  11/25/2021   Procedure: BIOPSY;  Surgeon: Tressia Danas, MD;  Location: [facility];  Service: Gastroenterology;;   disc ectomy     ESOPHAGOGASTRODUODENOSCOPY (EGD) WITH PROPOFOL N/A 11/25/2021   Procedure: ESOPHAGOGASTRODUODENOSCOPY (EGD) WITH PROPOFOL;  Surgeon: Tressia Danas, MD;  Location: [facility];  Service: Gastroenterology;  Laterality: N/A;   pheumothorax      PLEURAL SCARIFICATION     TONSILECTOMY/ADENOIDECTOMY WITH MYRINGOTOMY       Past OB/GYN History: G0 P0 Vaginal deliveries: 0,  Forceps/ Vacuum deliveries: 0, Cesarean  section: 0 Menopausal: Yes, at age 89 Last pap smear was 2018.  Any history of abnormal pap smears: no.   Medications: She has a current medication list which includes the following prescription(s): aspirin, calcium carbonate-vitamin d, diazepam, doxycycline, [START ON 03/09/2023] estradiol, ibandronate, metronidazole, multivitamin, pantoprazole,  rosuvastatin, and turmeric, and the following Facility-Administered Medications: sodium chloride.   Allergies: Patient has No Known Allergies.   Social History:  Social History   Tobacco Use   Smoking status: Former    Packs/day: 1.50    Years: 28.00    Additional pack years: 0.00    Total pack years: 42.00    Types: Cigarettes    Quit date: 09/23/1995    Years since quitting: 27.4   Smokeless tobacco: Never  Vaping Use   Vaping Use: Never used  Substance Use Topics   Alcohol use: No   Drug use: No    Relationship status: married She lives with husband.   She is not employed. Regular exercise: Yes: Walking-17miles and active History of abuse: No  Family History:   Family History  Problem Relation Age of Onset   Heart disease Mother    Cancer Mother        breast cancer   Breast cancer Mother    Cancer Father        myo dysplasia   Hyperlipidemia Brother    Hypertension Brother    Mental illness Brother    Other Brother        pre-diabetes   Cancer Maternal Grandmother        cervical or uterine   Cancer Paternal Grandmother        ovarian   Cancer Paternal Grandfather        prostate   Esophageal cancer Maternal Aunt    Colon cancer Neg Hx    Pancreatic cancer Neg Hx      Review of Systems: Review of Systems  Constitutional:  Negative for fever, malaise/fatigue and weight loss.  Respiratory:  Negative for cough, shortness of breath and wheezing.   Cardiovascular:  Negative for chest pain, palpitations and leg swelling.  Gastrointestinal:  Negative for abdominal pain, blood in stool and constipation.  Musculoskeletal:  Negative for myalgias.  Neurological:  Negative for dizziness, weakness and headaches.  Endo/Heme/Allergies:        +Hot flashes  Psychiatric/Behavioral:  Negative for depression and suicidal ideas. The patient is not nervous/anxious.      OBJECTIVE Physical Exam: Vitals:   03/06/23 0822  BP: 137/69  Pulse: 61  Weight: 125 lb (56.7  kg)  Height: 5\' 3"  (1.6 m)    Physical Exam Constitutional:      General: She is not in acute distress.    Appearance: Normal appearance. She is not ill-appearing, toxic-appearing or diaphoretic.  Pulmonary:     Effort: Pulmonary effort is normal.  Abdominal:     General: Abdomen is flat.  Skin:    General: Skin is warm and dry.  Neurological:     Mental Status: She is alert and oriented to person, place, and time.  Psychiatric:        Mood and Affect: Mood normal.        Behavior: Behavior normal.        Thought Content: Thought content normal.        Judgment: Judgment normal.      GU / Detailed Urogynecologic Evaluation:  Pelvic Exam: Normal external female genitalia; Bartholin's and Skene's glands normal in appearance; urethral meatus  normal in appearance, no urethral masses or discharge.   CST: negative  s/p hysterectomy: Speculum exam reveals normal vaginal mucosa with  atrophy and normal vaginal cuff.  Adnexa normal adnexa.     Pelvic floor strength I/V  Pelvic floor musculature: Right levator tender, Right obturator tender, Left levator non-tender, Left obturator tender  POP-Q:   POP-Q  -3                                            Aa   -3                                           Ba  -7                                              C   2                                            Gh  3.5                                            Pb  7.5                                            tvl   -3                                            Ap  -3                                            Bp                                                 D      Rectal Exam:  Normal external rectal exam  Post-Void Residual (PVR) by Bladder Scan: In order to evaluate bladder emptying, we discussed obtaining a postvoid residual and she agreed to this procedure.  Procedure: The ultrasound unit was placed on the patient's abdomen in the suprapubic region after the  patient had voided. A PVR of 0 ml was obtained by bladder scan.  Laboratory Results:  POC Urine: Trace leukocytes and trace blood  ASSESSMENT AND PLAN Ms. Fabre is a 72 y.o. with:  1. Pelvic floor dysfunction   2. Levator spasm   3. Vaginal atrophy   4. Urinary frequency    Patient has painful pelvic floor  muscles with movement. On exam she was visibly uncomfortable and had pain with muscle palpation. She was unable to give a squeeze to "hold in urine" during exam.  Patient had bilateral muscle tension in her pelvic floor. Will refer to pelvic floor PT. Advised her that internal work will be helpful for her. Her goal is not necessarily to be sexually active but she reports it would not be bad to be able to enjoy her intimacy with her husband. May benefit from use of pelvic wand or dilators.  Patient has significant vaginal atrophy. She has been using Yuvafem, but still has significant dryness on exam. Discussed switching to estrogen cream and then using coconut oil on nights she is not using estrogen.  Not as bothersome and may improve with pelvic floor PT. It seems to be a mix of stress and urge incontinence. Can address this more after physical therapy.   Patient to follow up in 2-3 months after she has done some pelvic floor PT.    Selmer Dominion, NP

## 2023-03-06 ENCOUNTER — Encounter: Payer: Self-pay | Admitting: Obstetrics and Gynecology

## 2023-03-06 ENCOUNTER — Ambulatory Visit: Payer: PPO | Admitting: Obstetrics and Gynecology

## 2023-03-06 VITALS — BP 137/69 | HR 61 | Ht 63.0 in | Wt 125.0 lb

## 2023-03-06 DIAGNOSIS — R350 Frequency of micturition: Secondary | ICD-10-CM

## 2023-03-06 DIAGNOSIS — M62838 Other muscle spasm: Secondary | ICD-10-CM

## 2023-03-06 DIAGNOSIS — M6289 Other specified disorders of muscle: Secondary | ICD-10-CM

## 2023-03-06 DIAGNOSIS — N952 Postmenopausal atrophic vaginitis: Secondary | ICD-10-CM | POA: Diagnosis not present

## 2023-03-06 LAB — POCT URINALYSIS DIPSTICK
Bilirubin, UA: NEGATIVE
Glucose, UA: NEGATIVE
Ketones, UA: NEGATIVE
Nitrite, UA: NEGATIVE
Protein, UA: NEGATIVE
Spec Grav, UA: 1.01 (ref 1.010–1.025)
Urobilinogen, UA: 0.2 E.U./dL
pH, UA: 7 (ref 5.0–8.0)

## 2023-03-06 MED ORDER — ESTRADIOL 0.1 MG/GM VA CREA: 0.5000 g | TOPICAL_CREAM | VAGINAL | 11 refills | Status: DC

## 2023-03-06 NOTE — Patient Instructions (Signed)
Start with pelvic floor PT and the vaginal estrogen.   The vaginal estrogen, use a blueberry sized amount onto the finger nightly and then after the first two weeks go to twice a week.   You can also do coconut oil in the vaginal area on nights you do not use the estrogen.

## 2023-03-10 ENCOUNTER — Other Ambulatory Visit: Payer: Self-pay | Admitting: Physician Assistant

## 2023-03-10 ENCOUNTER — Encounter: Payer: Self-pay | Admitting: Physician Assistant

## 2023-03-10 MED ORDER — DIAZEPAM 5 MG PO TABS
5.0000 mg | ORAL_TABLET | Freq: Two times a day (BID) | ORAL | 0 refills | Status: AC | PRN
Start: 2023-03-10 — End: ?

## 2023-03-10 NOTE — Telephone Encounter (Signed)
See note

## 2023-03-16 ENCOUNTER — Other Ambulatory Visit: Payer: Self-pay

## 2023-03-16 ENCOUNTER — Ambulatory Visit: Payer: PPO | Attending: Obstetrics and Gynecology | Admitting: Physical Therapy

## 2023-03-16 DIAGNOSIS — R293 Abnormal posture: Secondary | ICD-10-CM | POA: Insufficient documentation

## 2023-03-16 DIAGNOSIS — M62838 Other muscle spasm: Secondary | ICD-10-CM | POA: Insufficient documentation

## 2023-03-16 DIAGNOSIS — R279 Unspecified lack of coordination: Secondary | ICD-10-CM | POA: Diagnosis not present

## 2023-03-16 DIAGNOSIS — M6281 Muscle weakness (generalized): Secondary | ICD-10-CM | POA: Insufficient documentation

## 2023-03-16 NOTE — Therapy (Signed)
OUTPATIENT PHYSICAL THERAPY FEMALE PELVIC EVALUATION   Patient Name: [name] MRN: [id number] DOB:[birth year], 72 y.o., female Today's Date: 03/16/2023  END OF SESSION:  PT End of Session - 03/16/23 1146     Visit Number 1    Date for PT Re-Evaluation 06/16/23    Authorization Type [employer]    PT Start Time 1101    PT Stop Time 1145    PT Time Calculation (min) 44 min    Activity Tolerance Patient tolerated treatment well    Behavior During Therapy WFL for tasks assessed/performed             Past Medical History:  Diagnosis Date   Anxiety    When I fly   GERD (gastroesophageal reflux disease)    Hyperlipidemia    Osteoporosis    osteoporosis   Past Surgical History:  Procedure Laterality Date   ABDOMINAL HYSTERECTOMY     APPENDECTOMY  2006   BALLOON DILATION N/A 11/25/2021   Procedure: BALLOON DILATION;  Surgeon: Tressia Danas, MD;  Location: [facility];  Service: Gastroenterology;  Laterality: N/A;   BIOPSY  11/25/2021   Procedure: BIOPSY;  Surgeon: Tressia Danas, MD;  Location: [facility];  Service: Gastroenterology;;   disc ectomy     ESOPHAGOGASTRODUODENOSCOPY (EGD) WITH PROPOFOL N/A 11/25/2021   Procedure: ESOPHAGOGASTRODUODENOSCOPY (EGD) WITH PROPOFOL;  Surgeon: Tressia Danas, MD;  Location: [facility];  Service: Gastroenterology;  Laterality: N/A;   pheumothorax      PLEURAL SCARIFICATION     TONSILECTOMY/ADENOIDECTOMY WITH MYRINGOTOMY     Patient Active Problem List   Diagnosis Date Noted   Acute gastric ulcer without hemorrhage or perforation    Difficult intubation 10/15/2021   Gastroesophageal reflux disease 09/27/2021   Dysphagia 09/27/2021   Endometriosis 06/04/2021   Atrophic vaginitis 06/04/2021   Reduced libido 06/04/2021   Hyperlipidemia, mild 12/03/2020   Trigger thumb, left thumb 11/09/2019   Stress incontinence 06/30/2018   S/P total abdominal hysterectomy 10/03/2016   Abnormal mammogram 02/05/2016    Osteoporosis without current pathological fracture 12/13/2015   Nonspecific abnormal electrocardiogram (ECG) (EKG) 09/27/2013   Collagenous colitis 09/23/2003    PCP: Jarold Motto, PA  REFERRING PROVIDER: Selmer Dominion, NP  REFERRING DIAG: R35.0 (ICD-10-CM) - Urinary frequency M62.89 (ICD-10-CM) - Pelvic floor dysfunction M62.838 (ICD-10-CM) - Levator spasm  THERAPY DIAG:  Muscle weakness (generalized)  Abnormal posture  Unspecified lack of coordination  Other muscle spasm  Rationale for Evaluation and Treatment: Rehabilitation  ONSET DATE: a few years  SUBJECTIVE:  SUBJECTIVE STATEMENT: Sometimes has pelvic pain, worse after lifting something. Last time it happened for several days, after traveling and having to carry suitcase. Sometimes just feels tightness.   Fluid intake: Yes: a lot of fluid in the morning, unsure exact amounts does see increased urine frequency with am and less fluids in afternoon    PAIN:  Are you having pain? Yes NPRS scale: 3/10 at worst, but urinary urgency is much worse when that pain is there and this is more bothersome than pain Pain location:  pelvic/lower abdominal   Pain type: heaviness/pressure Pain description: intermittent   Aggravating factors: lifting things, prolonged walking (out of her normal amount) Relieving factors: goes away a couple days after exertion   PRECAUTIONS: None  WEIGHT BEARING RESTRICTIONS: No  FALLS:  Has patient fallen in last 6 months? No  LIVING ENVIRONMENT: Lives with: lives with their family Lives in: House/apartment   OCCUPATION: retired  PLOF: Independent  PATIENT GOALS: to have no pain  PERTINENT HISTORY:  Hysterectomy, Osteoporosis, Anxiety,  Sexual abuse: No  BOWEL MOVEMENT: Pain with bowel  movement: No Type of bowel movement:Type (Bristol Stool Scale) 4, Frequency daily, and Strain No Fully empty rectum: Yes:   Leakage: No Pads: No Fiber supplement: No  URINATION: Pain with urination: No Fully empty bladder: No - never Stream: Strong and Weak Urgency: Yes: worse with pelvic pain Frequency: not usually longer than every 2 hours, 1x per night Leakage: Urge to void, Coughing, and Sneezing only with very full bladder and this is rare, even less often will have small leak standing after urinating.  Pads: No  INTERCOURSE: Pain with intercourse:  not active, but is painful but this isn't why not active Ability to have vaginal penetration:  Yes:   Climax: not painful Marinoff Scale: 0/3  PREGNANCY: Vaginal deliveries 0 Tearing No C-section deliveries 0 Currently pregnant No  PROLAPSE: None   OBJECTIVE:   DIAGNOSTIC FINDINGS:   COGNITION: Overall cognitive status: Within functional limits for tasks assessed     SENSATION: Light touch: Appears intact Proprioception: Appears intact  MUSCLE LENGTH: Bil hamstrings and adductors limited by 25%  POSTURE: rounded shoulders and forward head  PELVIC ALIGNMENT: WFL  LUMBARAROM/PROM:  A/PROM A/PROM  eval  Flexion Limited by 50%  Extension WFL  Right lateral flexion WFL  Left lateral flexion WFL  Right rotation Limited by 25%  Left rotation Limited by 25%   (Blank rows = not tested)  LOWER EXTREMITY ROM:  WFL  LOWER EXTREMITY MMT:  Bil hip abduction 3+/5, all other hips 4/5; knees 5/5 PALPATION:   General  tension at bil piriformis but no TTP, tension at bil lumbar paraspinals, mild fascial restrictions at hysterectomy scar site                External Perineal Exam no TTP, mild tension at medial gluteals                             Internal Pelvic Floor tension throughout superficial and deep pelvic floor, TTP at RT pelvic floor side  Patient confirms identification and approves PT to assess  internal pelvic floor and treatment Yes No emotional/communication barriers or cognitive limitation. Patient is motivated to learn. Patient understands and agrees with treatment goals and plan. PT explains patient will be examined in standing, sitting, and lying down to see how their muscles and joints work. When they are ready, they will be asked to  remove their underwear so PT can examine their perineum. The patient is also given the option of providing their own chaperone as one is not provided in our facility. The patient also has the right and is explained the right to defer or refuse any part of the evaluation or treatment including the internal exam. With the patient's consent, PT will use one gloved finger to gently assess the muscles of the pelvic floor, seeing how well it contracts and relaxes and if there is muscle symmetry. After, the patient will get dressed and PT and patient will discuss exam findings and plan of care. PT and patient discuss plan of care, schedule, attendance policy and HEP activities.   PELVIC MMT:   MMT eval  Vaginal Unable to gain contraction or bulge of pelvic floor due to tension  Internal Anal Sphincter   External Anal Sphincter   Puborectalis   Diastasis Recti   (Blank rows = not tested)        TONE: Increased   PROLAPSE: Not seen in hooklying at anterior or posterior with cough   TODAY'S TREATMENT:                                                                                                                              DATE:   03/16/23 EVAL Examination completed, findings reviewed, pt educated on POC, HEP, and feminine moisturizer handout given and reviewed. Pt motivated to participate in PT and agreeable to attempt recommendations.     PATIENT EDUCATION:  Education details: [id number] Person educated: Patient Education method: Explanation, Demonstration, Tactile cues, Verbal cues, and Handouts Education comprehension: verbalized understanding and  returned demonstration  HOME EXERCISE PROGRAM: [id number]  ASSESSMENT:  CLINICAL IMPRESSION: Patient is a 72 y.o. female  who was seen today for physical therapy evaluation and treatment for pelvic pain and tension, urinary frequency/urgency. Pt found to have tension in lumbar spine, bil hips, lower abdominals, decreased flexibility in hips and spine, decreased core and hip strength. Patient consented to internal pelvic floor assessment vaginally this date and found to have increased tension throughout pelvic floor with Rt sided pain with palpation, inability to contract or relax/bulge pelvic floor with cues. With extra time and visualization, pt able to demonstrate slight release of pelvic floor and relaxation. Pt reports she has noted worsening of pain/tension with lifting and stress, sometimes worse with prolonged activity outside of her norm and will remain for a few days. Pt would benefit from additional PT to further address deficits.      OBJECTIVE IMPAIRMENTS: decreased coordination, decreased endurance, decreased mobility, decreased strength, increased fascial restrictions, increased muscle spasms, impaired flexibility, improper body mechanics, postural dysfunction, and pain.   ACTIVITY LIMITATIONS: carrying, lifting, continence, and locomotion level  PARTICIPATION LIMITATIONS: community activity  PERSONAL FACTORS: Time since onset of injury/illness/exacerbation are also affecting patient's functional outcome.   REHAB POTENTIAL: Good  CLINICAL DECISION MAKING: Stable/uncomplicated  EVALUATION COMPLEXITY: Low   GOALS: Goals reviewed  with patient? Yes  SHORT TERM GOALS: Target date: 04/13/23  Pt to be I with HEP.  Baseline: Goal status: INITIAL  2.  Pt will have 25% less urgency due to bladder retraining and strengthening  Baseline:  Goal status: INITIAL  3.  Pt to report improved time between bladder voids to at least 3 hours for improved QOL with decreased urinary  frequency.   Baseline:  Goal status: INITIAL  4.  Pt to demonstrate improved ability to coordination pelvic floor and breathing mechanics for decreased tension at pelvic floor and improved relaxation for decreased pain at least 50% of the time. Baseline:  Goal status: INITIAL  5.  Pt to be I with voiding mechanics for improved bladder evacuations. Baseline:  Goal status: INITIAL   LONG TERM GOALS: Target date: 06/16/23  Pt to be I with advanced HEP.  Baseline:  Goal status: INITIAL  2.  Pt to demonstrate at least 5/5 bil hip strength for improved pelvic stability and functional squats without pelvic pain.  Baseline:  Goal status: INITIAL  3.  Pt to demonstrate improved ability to coordination pelvic floor and breathing mechanics for decreased tension at pelvic floor and improved relaxation for decreased pain at least 75% of the time. Baseline:  Goal status: INITIAL  4.  Pt will have 50% less urgency due to bladder retraining and strengthening   Baseline:  Goal status: INITIAL  5.  Pt to demonstrate improved coordination of pelvic floor and breathing mechanics with 10# squat with appropriate synergistic patterns to decrease pain and leakage at least 75% of the time.    Baseline:  Goal status: INITIAL  6.  Pt to demonstrate at least 4/5 pelvic floor strength and ability to fully relax pelvic floor after each rep and bulge for improved pelvic stability and decreased strain at pelvic floor/ decrease leakage.  Baseline:  Goal status: INITIAL  PLAN:  PT FREQUENCY: 1x/week  PT DURATION:  8 sessions  PLANNED INTERVENTIONS: Therapeutic exercises, Therapeutic activity, Neuromuscular re-education, Patient/Family education, Self Care, Joint mobilization, DME instructions, Aquatic Therapy, Dry Needling, Spinal mobilization, Cryotherapy, Moist heat, scar mobilization, Taping, Biofeedback, and Manual therapy  PLAN FOR NEXT SESSION: internal as needed for improved mobility/relaxation at  pelvic floor, hip/spine/core/pelvic floor mobility/stretching, core and hip strengthening, pelvic floor strengthening once relaxation improved, voiding and breathing mechanics, pelvic floor relaxation techniques, wand education  Otelia Sergeant, PT, DPT 03/16/2411:01 PM

## 2023-03-16 NOTE — Patient Instructions (Signed)

## 2023-03-31 ENCOUNTER — Ambulatory Visit: Payer: PPO | Attending: Obstetrics and Gynecology | Admitting: Physical Therapy

## 2023-03-31 DIAGNOSIS — R293 Abnormal posture: Secondary | ICD-10-CM | POA: Diagnosis not present

## 2023-03-31 DIAGNOSIS — R279 Unspecified lack of coordination: Secondary | ICD-10-CM | POA: Insufficient documentation

## 2023-03-31 DIAGNOSIS — M6281 Muscle weakness (generalized): Secondary | ICD-10-CM | POA: Diagnosis not present

## 2023-03-31 NOTE — Therapy (Signed)
OUTPATIENT PHYSICAL THERAPY FEMALE PELVIC EVALUATION   Patient Name: [name] MRN: [id number] DOB:08-29-1951, 72 y.o., female Today's Date: 03/31/2023  END OF SESSION:  PT End of Session - 03/31/23 1540     Visit Number 2    Date for PT Re-Evaluation 06/16/23    Authorization Type [employer]    PT Start Time 1535    PT Stop Time 1615    PT Time Calculation (min) 40 min    Activity Tolerance Patient tolerated treatment well    Behavior During Therapy WFL for tasks assessed/performed             Past Medical History:  Diagnosis Date   Anxiety    When I fly   GERD (gastroesophageal reflux disease)    Hyperlipidemia    Osteoporosis    osteoporosis   Past Surgical History:  Procedure Laterality Date   ABDOMINAL HYSTERECTOMY     APPENDECTOMY  2006   BALLOON DILATION N/A 11/25/2021   Procedure: BALLOON DILATION;  Surgeon: Tressia Danas, MD;  Location: [facility];  Service: Gastroenterology;  Laterality: N/A;   BIOPSY  11/25/2021   Procedure: BIOPSY;  Surgeon: Tressia Danas, MD;  Location: [facility];  Service: Gastroenterology;;   disc ectomy     ESOPHAGOGASTRODUODENOSCOPY (EGD) WITH PROPOFOL N/A 11/25/2021   Procedure: ESOPHAGOGASTRODUODENOSCOPY (EGD) WITH PROPOFOL;  Surgeon: Tressia Danas, MD;  Location: [facility];  Service: Gastroenterology;  Laterality: N/A;   pheumothorax      PLEURAL SCARIFICATION     TONSILECTOMY/ADENOIDECTOMY WITH MYRINGOTOMY     Patient Active Problem List   Diagnosis Date Noted   Acute gastric ulcer without hemorrhage or perforation    Difficult intubation 10/15/2021   Gastroesophageal reflux disease 09/27/2021   Dysphagia 09/27/2021   Endometriosis 06/04/2021   Atrophic vaginitis 06/04/2021   Reduced libido 06/04/2021   Hyperlipidemia, mild 12/03/2020   Trigger thumb, left thumb 11/09/2019   Stress incontinence 06/30/2018   S/P total abdominal hysterectomy 10/03/2016   Abnormal mammogram 02/05/2016    Osteoporosis without current pathological fracture 12/13/2015   Nonspecific abnormal electrocardiogram (ECG) (EKG) 09/27/2013   Collagenous colitis 09/23/2003    PCP: Jarold Motto, PA  REFERRING PROVIDER: Selmer Dominion, NP  REFERRING DIAG: R35.0 (ICD-10-CM) - Urinary frequency M62.89 (ICD-10-CM) - Pelvic floor dysfunction M62.838 (ICD-10-CM) - Levator spasm  THERAPY DIAG:  Muscle weakness (generalized)  Abnormal posture  Unspecified lack of coordination  Rationale for Evaluation and Treatment: Rehabilitation  ONSET DATE: a few years  SUBJECTIVE:  SUBJECTIVE STATEMENT: Has been doing breathing all exercises, doesn't thinks she has been contracting pelvic floor with this but not straining. Is a little sore at Lt inner thigh but low levels.   Fluid intake: Yes: a lot of fluid in the morning, unsure exact amounts does see increased urine frequency with am and less fluids in afternoon    PAIN:  Are you having pain? Yes NPRS scale: 2/10 Pain location:  ly inner thigh  Pain type: achy Pain description: intermittent   Aggravating factors: lifting things, prolonged walking (out of her normal amount) Relieving factors: goes away a couple days after exertion   PRECAUTIONS: None  WEIGHT BEARING RESTRICTIONS: No  FALLS:  Has patient fallen in last 6 months? No  LIVING ENVIRONMENT: Lives with: lives with their family Lives in: House/apartment   OCCUPATION: retired  PLOF: Independent  PATIENT GOALS: to have no pain  PERTINENT HISTORY:  Hysterectomy, Osteoporosis, Anxiety,  Sexual abuse: No  BOWEL MOVEMENT: Pain with bowel movement: No Type of bowel movement:Type (Bristol Stool Scale) 4, Frequency daily, and Strain No Fully empty rectum: Yes:   Leakage: No Pads: No Fiber  supplement: No  URINATION: Pain with urination: No Fully empty bladder: No - never Stream: Strong and Weak Urgency: Yes: worse with pelvic pain Frequency: not usually longer than every 2 hours, 1x per night Leakage: Urge to void, Coughing, and Sneezing only with very full bladder and this is rare, even less often will have small leak standing after urinating.  Pads: No  INTERCOURSE: Pain with intercourse:  not active, but is painful but this isn't why not active Ability to have vaginal penetration:  Yes:   Climax: not painful Marinoff Scale: 0/3  PREGNANCY: Vaginal deliveries 0 Tearing No C-section deliveries 0 Currently pregnant No  PROLAPSE: None   OBJECTIVE:   DIAGNOSTIC FINDINGS:   COGNITION: Overall cognitive status: Within functional limits for tasks assessed     SENSATION: Light touch: Appears intact Proprioception: Appears intact  MUSCLE LENGTH: Bil hamstrings and adductors limited by 25%  POSTURE: rounded shoulders and forward head  PELVIC ALIGNMENT: WFL  LUMBARAROM/PROM:  A/PROM A/PROM  eval  Flexion Limited by 50%  Extension WFL  Right lateral flexion WFL  Left lateral flexion WFL  Right rotation Limited by 25%  Left rotation Limited by 25%   (Blank rows = not tested)  LOWER EXTREMITY ROM:  WFL  LOWER EXTREMITY MMT:  Bil hip abduction 3+/5, all other hips 4/5; knees 5/5 PALPATION:   General  tension at bil piriformis but no TTP, tension at bil lumbar paraspinals, mild fascial restrictions at hysterectomy scar site                External Perineal Exam no TTP, mild tension at medial gluteals                             Internal Pelvic Floor tension throughout superficial and deep pelvic floor, TTP at RT pelvic floor side  Patient confirms identification and approves PT to assess internal pelvic floor and treatment Yes No emotional/communication barriers or cognitive limitation. Patient is motivated to learn. Patient understands and  agrees with treatment goals and plan. PT explains patient will be examined in standing, sitting, and lying down to see how their muscles and joints work. When they are ready, they will be asked to remove their underwear so PT can examine their perineum. The patient is also given the option  of providing their own chaperone as one is not provided in our facility. The patient also has the right and is explained the right to defer or refuse any part of the evaluation or treatment including the internal exam. With the patient's consent, PT will use one gloved finger to gently assess the muscles of the pelvic floor, seeing how well it contracts and relaxes and if there is muscle symmetry. After, the patient will get dressed and PT and patient will discuss exam findings and plan of care. PT and patient discuss plan of care, schedule, attendance policy and HEP activities.   PELVIC MMT:   MMT eval  Vaginal Unable to gain contraction or bulge of pelvic floor due to tension  Internal Anal Sphincter   External Anal Sphincter   Puborectalis   Diastasis Recti   (Blank rows = not tested)        TONE: Increased   PROLAPSE: Not seen in hooklying at anterior or posterior with cough   TODAY'S TREATMENT:                                                                                                                              DATE:   03/16/23 EVAL Examination completed, findings reviewed, pt educated on POC, HEP, and feminine moisturizer handout given and reviewed. Pt motivated to participate in PT and agreeable to attempt recommendations.   03/31/23: Review of HEP and given pelvic relaxation technique video Pelvic tilts on pool noodle x10, 1 mins on pool noodle for relaxation with diaphragmatic breathing  X20 pelvic drops on green yoga ball X20 pelvic tilts ant/post, x10 pelvic circles rt/lt 2x1 min thoracic opener each 3x30s seated happy baby      PATIENT EDUCATION:  Education details:  [id number] Person educated: Patient Education method: Explanation, Demonstration, Tactile cues, Verbal cues, and Handouts Education comprehension: verbalized understanding and returned demonstration  HOME EXERCISE PROGRAM: [id number]  ASSESSMENT:  CLINICAL IMPRESSION: Patient presents for treatment with focus on relaxation techniques for pelvic floor mobility. Pt tolerated well and reported improved with awareness of this throughout session. Pt does still report symptoms similar to eval and would benefit from additional PT to further address deficits.      OBJECTIVE IMPAIRMENTS: decreased coordination, decreased endurance, decreased mobility, decreased strength, increased fascial restrictions, increased muscle spasms, impaired flexibility, improper body mechanics, postural dysfunction, and pain.   ACTIVITY LIMITATIONS: carrying, lifting, continence, and locomotion level  PARTICIPATION LIMITATIONS: community activity  PERSONAL FACTORS: Time since onset of injury/illness/exacerbation are also affecting patient's functional outcome.   REHAB POTENTIAL: Good  CLINICAL DECISION MAKING: Stable/uncomplicated  EVALUATION COMPLEXITY: Low   GOALS: Goals reviewed with patient? Yes  SHORT TERM GOALS: Target date: 04/13/23  Pt to be I with HEP.  Baseline: Goal status: INITIAL  2.  Pt will have 25% less urgency due to bladder retraining and strengthening  Baseline:  Goal status: INITIAL  3.  Pt to report improved time between bladder voids  to at least 3 hours for improved QOL with decreased urinary frequency.   Baseline:  Goal status: INITIAL  4.  Pt to demonstrate improved ability to coordination pelvic floor and breathing mechanics for decreased tension at pelvic floor and improved relaxation for decreased pain at least 50% of the time. Baseline:  Goal status: INITIAL  5.  Pt to be I with voiding mechanics for improved bladder evacuations. Baseline:  Goal status: INITIAL   LONG  TERM GOALS: Target date: 06/16/23  Pt to be I with advanced HEP.  Baseline:  Goal status: INITIAL  2.  Pt to demonstrate at least 5/5 bil hip strength for improved pelvic stability and functional squats without pelvic pain.  Baseline:  Goal status: INITIAL  3.  Pt to demonstrate improved ability to coordination pelvic floor and breathing mechanics for decreased tension at pelvic floor and improved relaxation for decreased pain at least 75% of the time. Baseline:  Goal status: INITIAL  4.  Pt will have 50% less urgency due to bladder retraining and strengthening   Baseline:  Goal status: INITIAL  5.  Pt to demonstrate improved coordination of pelvic floor and breathing mechanics with 10# squat with appropriate synergistic patterns to decrease pain and leakage at least 75% of the time.    Baseline:  Goal status: INITIAL  6.  Pt to demonstrate at least 4/5 pelvic floor strength and ability to fully relax pelvic floor after each rep and bulge for improved pelvic stability and decreased strain at pelvic floor/ decrease leakage.  Baseline:  Goal status: INITIAL  PLAN:  PT FREQUENCY: 1x/week  PT DURATION:  8 sessions  PLANNED INTERVENTIONS: Therapeutic exercises, Therapeutic activity, Neuromuscular re-education, Patient/Family education, Self Care, Joint mobilization, DME instructions, Aquatic Therapy, Dry Needling, Spinal mobilization, Cryotherapy, Moist heat, scar mobilization, Taping, Biofeedback, and Manual therapy  PLAN FOR NEXT SESSION: internal as needed for improved mobility/relaxation at pelvic floor, hip/spine/core/pelvic floor mobility/stretching, core and hip strengthening, pelvic floor strengthening once relaxation improved, voiding and breathing mechanics, pelvic floor relaxation techniques, wand education  Otelia Sergeant, PT, DPT 07/09/244:30 PM

## 2023-03-31 NOTE — Patient Instructions (Signed)
[email or web address]   

## 2023-04-07 ENCOUNTER — Other Ambulatory Visit: Payer: Self-pay | Admitting: Physician Assistant

## 2023-04-07 DIAGNOSIS — Z1231 Encounter for screening mammogram for malignant neoplasm of breast: Secondary | ICD-10-CM

## 2023-04-09 ENCOUNTER — Telehealth: Payer: Self-pay | Admitting: *Deleted

## 2023-04-09 NOTE — Telephone Encounter (Signed)
Received message from [facility] pt had a [employer] vaccine. Documented in chart.

## 2023-04-28 ENCOUNTER — Ambulatory Visit: Payer: PPO | Attending: Obstetrics and Gynecology | Admitting: Physical Therapy

## 2023-04-28 DIAGNOSIS — M25551 Pain in right hip: Secondary | ICD-10-CM | POA: Diagnosis not present

## 2023-04-28 DIAGNOSIS — M6281 Muscle weakness (generalized): Secondary | ICD-10-CM

## 2023-04-28 DIAGNOSIS — R279 Unspecified lack of coordination: Secondary | ICD-10-CM

## 2023-04-28 NOTE — Therapy (Signed)
OUTPATIENT PHYSICAL THERAPY FEMALE PELVIC EVALUATION   Patient Name: [name] MRN: [id number] DOB:09-29-1950, 72 y.o., female Today's Date: 04/28/2023  END OF SESSION:  PT End of Session - 04/28/23 0804     Visit Number 3    Date for PT Re-Evaluation 06/16/23    Authorization Type [employer]    PT Start Time 0801    PT Stop Time 0844    PT Time Calculation (min) 43 min    Activity Tolerance Patient tolerated treatment well    Behavior During Therapy WFL for tasks assessed/performed              Past Medical History:  Diagnosis Date   Anxiety    When I fly   GERD (gastroesophageal reflux disease)    Hyperlipidemia    Osteoporosis    osteoporosis   Past Surgical History:  Procedure Laterality Date   ABDOMINAL HYSTERECTOMY     APPENDECTOMY  2006   BALLOON DILATION N/A 11/25/2021   Procedure: BALLOON DILATION;  Surgeon: Tressia Danas, MD;  Location: [facility];  Service: Gastroenterology;  Laterality: N/A;   BIOPSY  11/25/2021   Procedure: BIOPSY;  Surgeon: Tressia Danas, MD;  Location: [facility];  Service: Gastroenterology;;   disc ectomy     ESOPHAGOGASTRODUODENOSCOPY (EGD) WITH PROPOFOL N/A 11/25/2021   Procedure: ESOPHAGOGASTRODUODENOSCOPY (EGD) WITH PROPOFOL;  Surgeon: Tressia Danas, MD;  Location: [facility];  Service: Gastroenterology;  Laterality: N/A;   pheumothorax      PLEURAL SCARIFICATION     TONSILECTOMY/ADENOIDECTOMY WITH MYRINGOTOMY     Patient Active Problem List   Diagnosis Date Noted   Acute gastric ulcer without hemorrhage or perforation    Difficult intubation 10/15/2021   Gastroesophageal reflux disease 09/27/2021   Dysphagia 09/27/2021   Endometriosis 06/04/2021   Atrophic vaginitis 06/04/2021   Reduced libido 06/04/2021   Hyperlipidemia, mild 12/03/2020   Trigger thumb, left thumb 11/09/2019   Stress incontinence 06/30/2018   S/P total abdominal hysterectomy 10/03/2016   Abnormal mammogram  02/05/2016   Osteoporosis without current pathological fracture 12/13/2015   Nonspecific abnormal electrocardiogram (ECG) (EKG) 09/27/2013   Collagenous colitis 09/23/2003    PCP: Jarold Motto, PA  REFERRING PROVIDER: Selmer Dominion, NP  REFERRING DIAG: R35.0 (ICD-10-CM) - Urinary frequency M62.89 (ICD-10-CM) - Pelvic floor dysfunction M62.838 (ICD-10-CM) - Levator spasm  THERAPY DIAG:  Muscle weakness (generalized)  Unspecified lack of coordination  Rationale for Evaluation and Treatment: Rehabilitation  ONSET DATE: a few years  SUBJECTIVE:  SUBJECTIVE STATEMENT: Pt reports all leakage has been much better and pleased with this however did have medial proximal adductor/pelvic pain with stress with traveling and stress but didn't have this prior to traveling it had resolved.  Fluid intake: Yes: a lot of fluid in the morning, unsure exact amounts does see increased urine frequency with am and less fluids in afternoon    PAIN:  Are you having pain? Yes NPRS scale: 2/10 Pain location:  ly inner thigh  Pain type: achy Pain description: intermittent   Aggravating factors: lifting things, prolonged walking (out of her normal amount) Relieving factors: goes away a couple days after exertion   PRECAUTIONS: None  WEIGHT BEARING RESTRICTIONS: No  FALLS:  Has patient fallen in last 6 months? No  LIVING ENVIRONMENT: Lives with: lives with their family Lives in: House/apartment   OCCUPATION: retired  PLOF: Independent  PATIENT GOALS: to have no pain  PERTINENT HISTORY:  Hysterectomy, Osteoporosis, Anxiety,  Sexual abuse: No  BOWEL MOVEMENT: Pain with bowel movement: No Type of bowel movement:Type (Bristol Stool Scale) 4, Frequency daily, and Strain No Fully empty rectum: Yes:    Leakage: No Pads: No Fiber supplement: No  URINATION: Pain with urination: No Fully empty bladder: No - never Stream: Strong and Weak Urgency: Yes: worse with pelvic pain Frequency: not usually longer than every 2 hours, 1x per night Leakage: Urge to void, Coughing, and Sneezing only with very full bladder and this is rare, even less often will have small leak standing after urinating.  Pads: No  INTERCOURSE: Pain with intercourse:  not active, but is painful but this isn't why not active Ability to have vaginal penetration:  Yes:   Climax: not painful Marinoff Scale: 0/3  PREGNANCY: Vaginal deliveries 0 Tearing No C-section deliveries 0 Currently pregnant No  PROLAPSE: None   OBJECTIVE:   DIAGNOSTIC FINDINGS:   COGNITION: Overall cognitive status: Within functional limits for tasks assessed     SENSATION: Light touch: Appears intact Proprioception: Appears intact  MUSCLE LENGTH: Bil hamstrings and adductors limited by 25%  POSTURE: rounded shoulders and forward head  PELVIC ALIGNMENT: WFL  LUMBARAROM/PROM:  A/PROM A/PROM  eval  Flexion Limited by 50%  Extension WFL  Right lateral flexion WFL  Left lateral flexion WFL  Right rotation Limited by 25%  Left rotation Limited by 25%   (Blank rows = not tested)  LOWER EXTREMITY ROM:  WFL  LOWER EXTREMITY MMT:  Bil hip abduction 3+/5, all other hips 4/5; knees 5/5 PALPATION:   General  tension at bil piriformis but no TTP, tension at bil lumbar paraspinals, mild fascial restrictions at hysterectomy scar site                External Perineal Exam no TTP, mild tension at medial gluteals                             Internal Pelvic Floor tension throughout superficial and deep pelvic floor, TTP at RT pelvic floor side  Patient confirms identification and approves PT to assess internal pelvic floor and treatment Yes No emotional/communication barriers or cognitive limitation. Patient is motivated to  learn. Patient understands and agrees with treatment goals and plan. PT explains patient will be examined in standing, sitting, and lying down to see how their muscles and joints work. When they are ready, they will be asked to remove their underwear so PT can examine their perineum. The patient is  also given the option of providing their own chaperone as one is not provided in our facility. The patient also has the right and is explained the right to defer or refuse any part of the evaluation or treatment including the internal exam. With the patient's consent, PT will use one gloved finger to gently assess the muscles of the pelvic floor, seeing how well it contracts and relaxes and if there is muscle symmetry. After, the patient will get dressed and PT and patient will discuss exam findings and plan of care. PT and patient discuss plan of care, schedule, attendance policy and HEP activities.   PELVIC MMT:   MMT eval  Vaginal Unable to gain contraction or bulge of pelvic floor due to tension  Internal Anal Sphincter   External Anal Sphincter   Puborectalis   Diastasis Recti   (Blank rows = not tested)        TONE: Increased   PROLAPSE: Not seen in hooklying at anterior or posterior with cough   TODAY'S TREATMENT:                                                                                                                              DATE:   03/31/23: Review of HEP and given pelvic relaxation technique video Pelvic tilts on pool noodle x10, 1 mins on pool noodle for relaxation with diaphragmatic breathing  X20 pelvic drops on green yoga ball X20 pelvic tilts ant/post, x10 pelvic circles rt/lt 2x1 min thoracic opener each 3x30s seated happy baby  04/28/23: Patient consented to internal pelvic floor assessment vaginally this date and found to have tension at superficial pelvic floor no pain and tolerated manual gentle stretching well. Did have good release of superficial transverse  bilaterally and with improved mobility did noted tension and trigger point at levator ani and this did release well.  Pt reported this felt better post manual work.  Pt educated on updated HEP for sitting in car as they have been traveling a lot and requested this - also thinks she gets stressed with traveling and thinks stretching would be helpful here as well. This was completed and reviewed with pt. Also continued pelvic floor relaxation techniques in sitting as well.      PATIENT EDUCATION:  Education details: [id number] Person educated: Patient Education method: Explanation, Demonstration, Tactile cues, Verbal cues, and Handouts Education comprehension: verbalized understanding and returned demonstration  HOME EXERCISE PROGRAM: [id number]  ASSESSMENT:  CLINICAL IMPRESSION: Patient presents for treatment with focus on relaxation techniques for pelvic floor mobility. Pt tolerated well and reported improved with awareness of this throughout session. Pt does still report symptoms similar to eval and would benefit from additional PT to further address deficits.      OBJECTIVE IMPAIRMENTS: decreased coordination, decreased endurance, decreased mobility, decreased strength, increased fascial restrictions, increased muscle spasms, impaired flexibility, improper body mechanics, postural dysfunction, and pain.   ACTIVITY LIMITATIONS: carrying, lifting, continence, and locomotion level  PARTICIPATION LIMITATIONS: community activity  PERSONAL FACTORS: Time since onset of injury/illness/exacerbation are also affecting patient's functional outcome.   REHAB POTENTIAL: Good  CLINICAL DECISION MAKING: Stable/uncomplicated  EVALUATION COMPLEXITY: Low   GOALS: Goals reviewed with patient? Yes  SHORT TERM GOALS: Target date: 04/13/23  Pt to be I with HEP.  Baseline: Goal status: MET  2.  Pt will have 25% less urgency due to bladder retraining and strengthening  Baseline:  Goal status:  MET  3.  Pt to report improved time between bladder voids to at least 3 hours for improved QOL with decreased urinary frequency.   Baseline:  Goal status: on going  4.  Pt to demonstrate improved ability to coordination pelvic floor and breathing mechanics for decreased tension at pelvic floor and improved relaxation for decreased pain at least 50% of the time. Baseline:  Goal status: on going  5.  Pt to be I with voiding mechanics for improved bladder evacuations. Baseline:  Goal status: MET   LONG TERM GOALS: Target date: 06/16/23  Pt to be I with advanced HEP.  Baseline:  Goal status: on going  2.  Pt to demonstrate at least 5/5 bil hip strength for improved pelvic stability and functional squats without pelvic pain.  Baseline:  Goal status: on going  3.  Pt to demonstrate improved ability to coordination pelvic floor and breathing mechanics for decreased tension at pelvic floor and improved relaxation for decreased pain at least 75% of the time. Baseline:  Goal status: on going  4.  Pt will have 50% less urgency due to bladder retraining and strengthening   Baseline:  Goal status: on going  5.  Pt to demonstrate improved coordination of pelvic floor and breathing mechanics with 10# squat with appropriate synergistic patterns to decrease pain and leakage at least 75% of the time.    Baseline:  Goal status: on going  6.  Pt to demonstrate at least 4/5 pelvic floor strength and ability to fully relax pelvic floor after each rep and bulge for improved pelvic stability and decreased strain at pelvic floor/ decrease leakage.  Baseline:  Goal status: on going  PLAN:  PT FREQUENCY: 1x/week  PT DURATION:  8 sessions  PLANNED INTERVENTIONS: Therapeutic exercises, Therapeutic activity, Neuromuscular re-education, Patient/Family education, Self Care, Joint mobilization, DME instructions, Aquatic Therapy, Dry Needling, Spinal mobilization, Cryotherapy, Moist heat, scar  mobilization, Taping, Biofeedback, and Manual therapy  PLAN FOR NEXT SESSION: internal as needed for improved mobility/relaxation at pelvic floor, hip/spine/core/pelvic floor mobility/stretching, core and hip strengthening, pelvic floor strengthening once relaxation improved, voiding and breathing mechanics, pelvic floor relaxation techniques, wand education  Otelia Sergeant, PT, DPT 08/06/248:04 AM

## 2023-04-29 ENCOUNTER — Ambulatory Visit (INDEPENDENT_AMBULATORY_CARE_PROVIDER_SITE_OTHER): Payer: PPO | Admitting: Physician Assistant

## 2023-04-29 ENCOUNTER — Encounter: Payer: Self-pay | Admitting: Physician Assistant

## 2023-04-29 VITALS — BP 110/70 | HR 60 | Temp 97.5°F | Ht 63.0 in | Wt 125.0 lb

## 2023-04-29 DIAGNOSIS — G4452 New daily persistent headache (NDPH): Secondary | ICD-10-CM

## 2023-04-29 DIAGNOSIS — R42 Dizziness and giddiness: Secondary | ICD-10-CM

## 2023-04-29 LAB — COMPREHENSIVE METABOLIC PANEL
ALT: 16 U/L (ref 0–35)
AST: 21 U/L (ref 0–37)
Albumin: 4.4 g/dL (ref 3.5–5.2)
Alkaline Phosphatase: 54 U/L (ref 39–117)
BUN: 15 mg/dL (ref 6–23)
CO2: 31 mEq/L (ref 19–32)
Calcium: 9.6 mg/dL (ref 8.4–10.5)
Chloride: 100 mEq/L (ref 96–112)
Creatinine, Ser: 0.68 mg/dL (ref 0.40–1.20)
GFR: 88.43 mL/min (ref >60.00)
Glucose, Bld: 96 mg/dL (ref 70–99)
Potassium: 4.1 mEq/L (ref 3.5–5.1)
Sodium: 138 mEq/L (ref 135–145)
Total Bilirubin: 0.3 mg/dL (ref 0.2–1.2)
Total Protein: 7.6 g/dL (ref 6.0–8.3)

## 2023-04-29 LAB — CBC
HCT: 39 % (ref 36.0–46.0)
Hemoglobin: 12.8 g/dL (ref 12.0–15.0)
MCHC: 32.8 g/dL (ref 30.0–36.0)
MCV: 83.6 fl (ref 78.0–100.0)
Platelets: 323 10*3/uL (ref 150.0–400.0)
RBC: 4.67 Mil/uL (ref 3.87–5.11)
RDW: 13.6 % (ref 11.5–15.5)
WBC: 8.1 10*3/uL (ref 4.0–10.5)

## 2023-04-29 LAB — TSH: TSH: 1.72 u[IU]/mL (ref 0.35–5.50)

## 2023-04-29 LAB — POC COVID19 BINAXNOW: SARS Coronavirus 2 Ag: NEGATIVE

## 2023-04-29 NOTE — Patient Instructions (Signed)
It was great to see you!  Trial Flonase Update blood work today Work on getting good sleep as able Consider pushing fluids to 64 oz daily  Worsening symptom(s) - call me or go to ER  If no improvement by Monday -- call me and we will scan  Take care,  Jarold Motto PA-C

## 2023-04-29 NOTE — Progress Notes (Signed)
[name] is a 72 y.o. female here for a new problem.  History of Present Illness:   Chief Complaint  Patient presents with   Headache    Pt c/o dull headache  x 1 week off and on, temporal area and radiates around her head, feeling fuzzy. Denies blurred vision.    HPI  Vertigo and headache(s)  She was doing some exercises on the floor on 04/24/23 and when she stood up she had an episode of vertigo followed by nausea that day. Has also been having headache, balance problems, ear popping, fatigue. Still having intermittent vertigo, headache. Has tried vertigo maneuvers with no relief. Took ibuprofen for two nights when symptoms began and that seemed to help. Headache is worse with activity. She was in [place] earlier in the week before symptoms started for a family visit. Denies upper respiratory symptoms. Eats regularly and hydrates well. No one is sick in her household. Denies changes in vision, chest pain, shortness of breath, swelling in legs, weakness, hemiparesis, confusion, slurred speech.  Past Medical History:  Diagnosis Date   Anxiety    When I fly   GERD (gastroesophageal reflux disease)    Hyperlipidemia    Osteoporosis    osteoporosis     Social History   Tobacco Use   Smoking status: Former    Current packs/day: 0.00    Average packs/day: 1.5 packs/day for 28.0 years (42.0 ttl pk-yrs)    Types: Cigarettes    Start date: 09/23/1967    Quit date: 09/23/1995    Years since quitting: 27.6   Smokeless tobacco: Never  Vaping Use   Vaping status: Never Used  Substance Use Topics   Alcohol use: No   Drug use: No    Past Surgical History:  Procedure Laterality Date   ABDOMINAL HYSTERECTOMY     APPENDECTOMY  2006   BALLOON DILATION N/A 11/25/2021   Procedure: BALLOON DILATION;  Surgeon: Tressia Danas, MD;  Location: [facility];  Service: Gastroenterology;  Laterality: N/A;   BIOPSY  11/25/2021   Procedure: BIOPSY;  Surgeon: Tressia Danas,  MD;  Location: [facility];  Service: Gastroenterology;;   disc ectomy     ESOPHAGOGASTRODUODENOSCOPY (EGD) WITH PROPOFOL N/A 11/25/2021   Procedure: ESOPHAGOGASTRODUODENOSCOPY (EGD) WITH PROPOFOL;  Surgeon: Tressia Danas, MD;  Location: [facility];  Service: Gastroenterology;  Laterality: N/A;   pheumothorax      PLEURAL SCARIFICATION     TONSILECTOMY/ADENOIDECTOMY WITH MYRINGOTOMY      Family History  Problem Relation Age of Onset   Heart disease Mother    Cancer Mother        breast cancer   Breast cancer Mother    Cancer Father        myo dysplasia   Hyperlipidemia Brother    Hypertension Brother    Mental illness Brother    Other Brother        pre-diabetes   Cancer Maternal Grandmother        cervical or uterine   Cancer Paternal Grandmother        ovarian   Cancer Paternal Grandfather        prostate   Esophageal cancer Maternal Aunt    Colon cancer Neg Hx    Pancreatic cancer Neg Hx     No Known Allergies  Current Medications:   Current Outpatient Medications:    aspirin 81 MG tablet, Take 81 mg by mouth daily., Disp: , Rfl:    Calcium Carbonate-Vitamin D (CALTRATE 600+D  PO), Take 1 tablet by mouth daily., Disp: , Rfl:    diazepam (VALIUM) 5 MG tablet, Take 1 tablet (5 mg total) by mouth every 12 (twelve) hours as needed for anxiety Corporate treasurer Darius Bump)., Disp: 10 tablet, Rfl: 0   doxycycline (ADOXA) 50 MG tablet, Take 50 mg by mouth daily as needed (rosacea)., Disp: , Rfl:    estradiol (ESTRACE) 0.1 MG/GM vaginal cream, Place 0.5 g vaginally 2 (two) times a week. Place 0.5g nightly for two weeks then twice a week after, Disp: 42.5 g, Rfl: 11   ibandronate (BONIVA) 150 MG tablet, Take 1 tablet (150 mg total) by mouth every 30 (thirty) days. Take in the morning with a full glass of water, on an empty stomach, and do not take anything else by mouth or lie down for the next 30 min., Disp: 12 tablet, Rfl: 1   metroNIDAZOLE (METROCREAM) 0.75 % cream, Apply 1  application  topically as needed., Disp: , Rfl:    Multiple Vitamin (MULTIVITAMIN) capsule, Take 1 capsule by mouth daily., Disp: , Rfl:    pantoprazole (PROTONIX) 40 MG tablet, TAKE 1 TABLET BY MOUTH TWICE A DAY, Disp: 180 tablet, Rfl: 0   rosuvastatin (CRESTOR) 5 MG tablet, Take 1 tablet (5 mg total) by mouth daily., Disp: 90 tablet, Rfl: 3   TURMERIC PO, Take by mouth., Disp: , Rfl:   Current Facility-Administered Medications:    0.9 %  sodium chloride infusion, 500 mL, Intravenous, Continuous, Tressia Danas, MD   Review of Systems:   Review of Systems  Constitutional:  Positive for malaise/fatigue. Negative for fever.  HENT:  Positive for ear pain (Popping). Negative for congestion.   Eyes:  Negative for blurred vision and double vision.  Respiratory:  Negative for cough and shortness of breath.   Cardiovascular:  Negative for chest pain, palpitations and leg swelling.  Gastrointestinal:  Positive for nausea. Negative for vomiting.  Musculoskeletal:  Negative for back pain.  Skin:  Negative for rash.  Neurological:  Positive for dizziness and headaches. Negative for speech change, loss of consciousness and weakness.    Vitals:   Vitals:   04/29/23 1124  BP: 110/70  Pulse: 60  Temp: (!) 97.5 F (36.4 C)  TempSrc: Temporal  SpO2: 99%  Weight: 125 lb (56.7 kg)  Height: 5\' 3"  (1.6 m)     Body mass index is 22.14 kg/m.  Physical Exam:   Physical Exam Vitals and nursing note reviewed.  Constitutional:      General: She is not in acute distress.    Appearance: She is well-developed. She is not ill-appearing or toxic-appearing.  Cardiovascular:     Rate and Rhythm: Normal rate and regular rhythm.     Pulses: Normal pulses.     Heart sounds: Normal heart sounds, S1 normal and S2 normal.  Pulmonary:     Effort: Pulmonary effort is normal.     Breath sounds: Normal breath sounds.  Skin:    General: Skin is warm and dry.  Neurological:     General: No focal deficit  present.     Mental Status: She is alert.     GCS: GCS eye subscore is 4. GCS verbal subscore is 5. GCS motor subscore is 6.     Cranial Nerves: Cranial nerves 2-12 are intact.     Sensory: Sensation is intact.     Motor: Motor function is intact.     Coordination: Coordination is intact.     Gait: Gait is intact.  Psychiatric:        Speech: Speech normal.        Behavior: Behavior normal. Behavior is cooperative.     Assessment and Plan:   New daily persistent headache Neuro exam is wnl Due to new persistent headache(s) and age >69. recommend MR - she declined at this time and would like to trial watchful waiting If new/worsening symptom(s), recommend she reach out and we will order stat MR without contrast Trial Flonase for suspect eustachian tube dysfunction and possible sinus pressure -- if no improvement by Monday, will order MR without contrast for evaluation  Vertigo Resolved Consider vestibular rehab for evaluation if no improvement   I,Alexander Ruley,acting as a scribe for Jarold Motto, PA.,have documented all relevant documentation on the behalf of Jarold Motto, PA,as directed by  Jarold Motto, PA while in the presence of Jarold Motto, [place].   I, Jarold Motto, [place], have reviewed all documentation for this visit. The documentation on 04/29/23 for the exam, diagnosis, procedures, and orders are all accurate and complete.    Jarold Motto, PA-C

## 2023-05-05 ENCOUNTER — Ambulatory Visit: Payer: PPO | Admitting: Physical Therapy

## 2023-05-05 DIAGNOSIS — M25551 Pain in right hip: Secondary | ICD-10-CM | POA: Diagnosis not present

## 2023-05-05 DIAGNOSIS — R279 Unspecified lack of coordination: Secondary | ICD-10-CM

## 2023-05-05 DIAGNOSIS — M62838 Other muscle spasm: Secondary | ICD-10-CM

## 2023-05-05 DIAGNOSIS — R293 Abnormal posture: Secondary | ICD-10-CM

## 2023-05-05 DIAGNOSIS — M6281 Muscle weakness (generalized): Secondary | ICD-10-CM

## 2023-05-05 NOTE — Therapy (Signed)
OUTPATIENT PHYSICAL THERAPY FEMALE PELVIC EVALUATION   Patient Name: [name] MRN: [id number] DOB:Dec 08, 1950, 72 y.o., female Today's Date: 05/05/2023  END OF SESSION:  PT End of Session - 05/05/23 0800     Visit Number 4    Date for PT Re-Evaluation 06/16/23    Authorization Type [employer]    PT Start Time 0756    PT Stop Time 0837    PT Time Calculation (min) 41 min    Activity Tolerance Patient tolerated treatment well    Behavior During Therapy WFL for tasks assessed/performed              Past Medical History:  Diagnosis Date   Anxiety    When I fly   GERD (gastroesophageal reflux disease)    Hyperlipidemia    Osteoporosis    osteoporosis   Past Surgical History:  Procedure Laterality Date   ABDOMINAL HYSTERECTOMY     APPENDECTOMY  2006   BALLOON DILATION N/A 11/25/2021   Procedure: BALLOON DILATION;  Surgeon: Tressia Danas, MD;  Location: [facility];  Service: Gastroenterology;  Laterality: N/A;   BIOPSY  11/25/2021   Procedure: BIOPSY;  Surgeon: Tressia Danas, MD;  Location: [facility];  Service: Gastroenterology;;   disc ectomy     ESOPHAGOGASTRODUODENOSCOPY (EGD) WITH PROPOFOL N/A 11/25/2021   Procedure: ESOPHAGOGASTRODUODENOSCOPY (EGD) WITH PROPOFOL;  Surgeon: Tressia Danas, MD;  Location: [facility];  Service: Gastroenterology;  Laterality: N/A;   pheumothorax      PLEURAL SCARIFICATION     TONSILECTOMY/ADENOIDECTOMY WITH MYRINGOTOMY     Patient Active Problem List   Diagnosis Date Noted   Acute gastric ulcer without hemorrhage or perforation    Difficult intubation 10/15/2021   Gastroesophageal reflux disease 09/27/2021   Dysphagia 09/27/2021   Endometriosis 06/04/2021   Atrophic vaginitis 06/04/2021   Reduced libido 06/04/2021   Hyperlipidemia, mild 12/03/2020   Trigger thumb, left thumb 11/09/2019   Stress incontinence 06/30/2018   S/P total abdominal hysterectomy 10/03/2016   Abnormal mammogram  02/05/2016   Osteoporosis without current pathological fracture 12/13/2015   Nonspecific abnormal electrocardiogram (ECG) (EKG) 09/27/2013   Collagenous colitis 09/23/2003    PCP: Jarold Motto, PA  REFERRING PROVIDER: Selmer Dominion, NP  REFERRING DIAG: R35.0 (ICD-10-CM) - Urinary frequency M62.89 (ICD-10-CM) - Pelvic floor dysfunction M62.838 (ICD-10-CM) - Levator spasm  THERAPY DIAG:  Muscle weakness (generalized)  Unspecified lack of coordination  Abnormal posture  Other muscle spasm  Rationale for Evaluation and Treatment: Rehabilitation  ONSET DATE: a few years  SUBJECTIVE:  SUBJECTIVE STATEMENT: Pt reports she has no pain, only one small urinary leakage instance since last visit.   Fluid intake: Yes: a lot of fluid in the morning, unsure exact amounts does see increased urine frequency with am and less fluids in afternoon    PAIN:  Are you having pain? Yes NPRS scale: 2/10 Pain location:  ly inner thigh  Pain type: achy Pain description: intermittent   Aggravating factors: lifting things, prolonged walking (out of her normal amount) Relieving factors: goes away a couple days after exertion   PRECAUTIONS: None  WEIGHT BEARING RESTRICTIONS: No  FALLS:  Has patient fallen in last 6 months? No  LIVING ENVIRONMENT: Lives with: lives with their family Lives in: House/apartment   OCCUPATION: retired  PLOF: Independent  PATIENT GOALS: to have no pain  PERTINENT HISTORY:  Hysterectomy, Osteoporosis, Anxiety,  Sexual abuse: No  BOWEL MOVEMENT: Pain with bowel movement: No Type of bowel movement:Type (Bristol Stool Scale) 4, Frequency daily, and Strain No Fully empty rectum: Yes:   Leakage: No Pads: No Fiber supplement: No  URINATION: Pain with urination:  No Fully empty bladder: No - never Stream: Strong and Weak Urgency: Yes: worse with pelvic pain Frequency: not usually longer than every 2 hours, 1x per night Leakage: Urge to void, Coughing, and Sneezing only with very full bladder and this is rare, even less often will have small leak standing after urinating.  Pads: No  INTERCOURSE: Pain with intercourse:  not active, but is painful but this isn't why not active Ability to have vaginal penetration:  Yes:   Climax: not painful Marinoff Scale: 0/3  PREGNANCY: Vaginal deliveries 0 Tearing No C-section deliveries 0 Currently pregnant No  PROLAPSE: None   OBJECTIVE:   DIAGNOSTIC FINDINGS:   COGNITION: Overall cognitive status: Within functional limits for tasks assessed     SENSATION: Light touch: Appears intact Proprioception: Appears intact  MUSCLE LENGTH: Bil hamstrings and adductors limited by 25%  POSTURE: rounded shoulders and forward head  PELVIC ALIGNMENT: WFL  LUMBARAROM/PROM:  A/PROM A/PROM  eval  Flexion Limited by 50%  Extension WFL  Right lateral flexion WFL  Left lateral flexion WFL  Right rotation Limited by 25%  Left rotation Limited by 25%   (Blank rows = not tested)  LOWER EXTREMITY ROM:  WFL  LOWER EXTREMITY MMT:  Bil hip abduction 3+/5, all other hips 4/5; knees 5/5 PALPATION:   General  tension at bil piriformis but no TTP, tension at bil lumbar paraspinals, mild fascial restrictions at hysterectomy scar site                External Perineal Exam no TTP, mild tension at medial gluteals                             Internal Pelvic Floor tension throughout superficial and deep pelvic floor, TTP at RT pelvic floor side  Patient confirms identification and approves PT to assess internal pelvic floor and treatment Yes No emotional/communication barriers or cognitive limitation. Patient is motivated to learn. Patient understands and agrees with treatment goals and plan. PT explains  patient will be examined in standing, sitting, and lying down to see how their muscles and joints work. When they are ready, they will be asked to remove their underwear so PT can examine their perineum. The patient is also given the option of providing their own chaperone as one is not provided in our facility. The patient  also has the right and is explained the right to defer or refuse any part of the evaluation or treatment including the internal exam. With the patient's consent, PT will use one gloved finger to gently assess the muscles of the pelvic floor, seeing how well it contracts and relaxes and if there is muscle symmetry. After, the patient will get dressed and PT and patient will discuss exam findings and plan of care. PT and patient discuss plan of care, schedule, attendance policy and HEP activities.   PELVIC MMT:   MMT eval  Vaginal Unable to gain contraction or bulge of pelvic floor due to tension  Internal Anal Sphincter   External Anal Sphincter   Puborectalis   Diastasis Recti   (Blank rows = not tested)        TONE: Increased   PROLAPSE: Not seen in hooklying at anterior or posterior with cough   TODAY'S TREATMENT:                                                                                                                              DATE:   03/31/23: 2x30s unilateral happy baby 2x30s piriformis stretch Hamstring with strap 3x30s each Sitting on ball - x10 pelvic circles bil, tilts x10 ant/post Seated marching with coordination of pelvic floor contraction and exhale 2x10 2x10 diaphragmatic breathing with pelvic floor contraction Urge drill given and reviewed      PATIENT EDUCATION:  Education details: [id number] Person educated: Patient Education method: Explanation, Demonstration, Tactile cues, Verbal cues, and Handouts Education comprehension: verbalized understanding and returned demonstration  HOME EXERCISE PROGRAM: [id number]  ASSESSMENT:  CLINICAL  IMPRESSION: Patient presents for treatment with focus on relaxation techniques for pelvic floor mobility, as pt is having no pain and has been able to relax pelvic floor more independently now did initiate gentle strengthening today. Pt tolerated well and reported improved with awareness of this throughout session, no pain and reports felt relax of pelvic floor post each contraction today.  Pt does still report symptoms similar to eval and would benefit from additional PT to further address deficits.      OBJECTIVE IMPAIRMENTS: decreased coordination, decreased endurance, decreased mobility, decreased strength, increased fascial restrictions, increased muscle spasms, impaired flexibility, improper body mechanics, postural dysfunction, and pain.   ACTIVITY LIMITATIONS: carrying, lifting, continence, and locomotion level  PARTICIPATION LIMITATIONS: community activity  PERSONAL FACTORS: Time since onset of injury/illness/exacerbation are also affecting patient's functional outcome.   REHAB POTENTIAL: Good  CLINICAL DECISION MAKING: Stable/uncomplicated  EVALUATION COMPLEXITY: Low   GOALS: Goals reviewed with patient? Yes  SHORT TERM GOALS: Target date: 04/13/23  Pt to be I with HEP.  Baseline: Goal status: MET  2.  Pt will have 25% less urgency due to bladder retraining and strengthening  Baseline:  Goal status: MET  3.  Pt to report improved time between bladder voids to at least 3 hours for improved QOL with decreased urinary frequency.  Baseline:  Goal status: MET  4.  Pt to demonstrate improved ability to coordination pelvic floor and breathing mechanics for decreased tension at pelvic floor and improved relaxation for decreased pain at least 50% of the time. Baseline:  Goal status: MET  5.  Pt to be I with voiding mechanics for improved bladder evacuations. Baseline:  Goal status: MET   LONG TERM GOALS: Target date: 06/16/23  Pt to be I with advanced HEP.  Baseline:   Goal status: on going  2.  Pt to demonstrate at least 5/5 bil hip strength for improved pelvic stability and functional squats without pelvic pain.  Baseline:  Goal status: MET  3.  Pt to demonstrate improved ability to coordination pelvic floor and breathing mechanics for decreased tension at pelvic floor and improved relaxation for decreased pain at least 75% of the time. Baseline:  Goal status: on going  4.  Pt will have 50% less urgency due to bladder retraining and strengthening   Baseline:  Goal status: on going  5.  Pt to demonstrate improved coordination of pelvic floor and breathing mechanics with 10# squat with appropriate synergistic patterns to decrease pain and leakage at least 75% of the time.    Baseline:  Goal status: on going  6.  Pt to demonstrate at least 4/5 pelvic floor strength and ability to fully relax pelvic floor after each rep and bulge for improved pelvic stability and decreased strain at pelvic floor/ decrease leakage.  Baseline:  Goal status: on going  PLAN:  PT FREQUENCY: 1x/week  PT DURATION:  8 sessions  PLANNED INTERVENTIONS: Therapeutic exercises, Therapeutic activity, Neuromuscular re-education, Patient/Family education, Self Care, Joint mobilization, DME instructions, Aquatic Therapy, Dry Needling, Spinal mobilization, Cryotherapy, Moist heat, scar mobilization, Taping, Biofeedback, and Manual therapy  PLAN FOR NEXT SESSION: internal as needed for improved mobility/relaxation at pelvic floor, hip/spine/core/pelvic floor mobility/stretching, core and hip strengthening, pelvic floor strengthening once relaxation improved, voiding and breathing mechanics, pelvic floor relaxation techniques, wand education  Otelia Sergeant, PT, DPT 08/13/248:38 AM

## 2023-05-05 NOTE — Patient Instructions (Signed)

## 2023-05-06 ENCOUNTER — Other Ambulatory Visit: Payer: Self-pay | Admitting: Physician Assistant

## 2023-05-06 ENCOUNTER — Encounter: Payer: Self-pay | Admitting: Physician Assistant

## 2023-05-06 DIAGNOSIS — G4452 New daily persistent headache (NDPH): Secondary | ICD-10-CM

## 2023-05-12 ENCOUNTER — Ambulatory Visit: Payer: PPO | Admitting: Physical Therapy

## 2023-05-12 DIAGNOSIS — R279 Unspecified lack of coordination: Secondary | ICD-10-CM

## 2023-05-12 DIAGNOSIS — M25551 Pain in right hip: Secondary | ICD-10-CM | POA: Diagnosis not present

## 2023-05-12 DIAGNOSIS — M62838 Other muscle spasm: Secondary | ICD-10-CM

## 2023-05-12 DIAGNOSIS — M6281 Muscle weakness (generalized): Secondary | ICD-10-CM

## 2023-05-12 NOTE — Therapy (Signed)
OUTPATIENT PHYSICAL THERAPY FEMALE PELVIC EVALUATION   Patient Name: [name] MRN: [id number] DOB:[birth year], 72 y.o., female Today's Date: 05/12/2023  END OF SESSION:  PT End of Session - 05/12/23 1236     Visit Number 5    Date for PT Re-Evaluation 06/16/23    Authorization Type [employer]    PT Start Time 1230    PT Stop Time 1310    PT Time Calculation (min) 40 min    Activity Tolerance Patient tolerated treatment well    Behavior During Therapy WFL for tasks assessed/performed              Past Medical History:  Diagnosis Date   Anxiety    When I fly   GERD (gastroesophageal reflux disease)    Hyperlipidemia    Osteoporosis    osteoporosis   Past Surgical History:  Procedure Laterality Date   ABDOMINAL HYSTERECTOMY     APPENDECTOMY  2006   BALLOON DILATION N/A 11/25/2021   Procedure: BALLOON DILATION;  Surgeon: Tressia Danas, MD;  Location: [facility];  Service: Gastroenterology;  Laterality: N/A;   BIOPSY  11/25/2021   Procedure: BIOPSY;  Surgeon: Tressia Danas, MD;  Location: [facility];  Service: Gastroenterology;;   disc ectomy     ESOPHAGOGASTRODUODENOSCOPY (EGD) WITH PROPOFOL N/A 11/25/2021   Procedure: ESOPHAGOGASTRODUODENOSCOPY (EGD) WITH PROPOFOL;  Surgeon: Tressia Danas, MD;  Location: [facility];  Service: Gastroenterology;  Laterality: N/A;   pheumothorax      PLEURAL SCARIFICATION     TONSILECTOMY/ADENOIDECTOMY WITH MYRINGOTOMY     Patient Active Problem List   Diagnosis Date Noted   Acute gastric ulcer without hemorrhage or perforation    Difficult intubation 10/15/2021   Gastroesophageal reflux disease 09/27/2021   Dysphagia 09/27/2021   Endometriosis 06/04/2021   Atrophic vaginitis 06/04/2021   Reduced libido 06/04/2021   Hyperlipidemia, mild 12/03/2020   Trigger thumb, left thumb 11/09/2019   Stress incontinence 06/30/2018   S/P total abdominal hysterectomy 10/03/2016   Abnormal mammogram  02/05/2016   Osteoporosis without current pathological fracture 12/13/2015   Nonspecific abnormal electrocardiogram (ECG) (EKG) 09/27/2013   Collagenous colitis 09/23/2003    PCP: Jarold Motto, PA  REFERRING PROVIDER: Selmer Dominion, NP  REFERRING DIAG: R35.0 (ICD-10-CM) - Urinary frequency M62.89 (ICD-10-CM) - Pelvic floor dysfunction M62.838 (ICD-10-CM) - Levator spasm  THERAPY DIAG:  Muscle weakness (generalized)  Unspecified lack of coordination  Other muscle spasm  Rationale for Evaluation and Treatment: Rehabilitation  ONSET DATE: a few years  SUBJECTIVE:  SUBJECTIVE STATEMENT: No  leakage in the last week at least, pain has been better.   Fluid intake: Yes: a lot of fluid in the morning, unsure exact amounts does see increased urine frequency with am and less fluids in afternoon    PAIN:  Are you having pain? No PRECAUTIONS: None  WEIGHT BEARING RESTRICTIONS: No  FALLS:  Has patient fallen in last 6 months? No  LIVING ENVIRONMENT: Lives with: lives with their family Lives in: House/apartment   OCCUPATION: retired  PLOF: Independent  PATIENT GOALS: to have no pain  PERTINENT HISTORY:  Hysterectomy, Osteoporosis, Anxiety,  Sexual abuse: No  BOWEL MOVEMENT: Pain with bowel movement: No Type of bowel movement:Type (Bristol Stool Scale) 4, Frequency daily, and Strain No Fully empty rectum: Yes:   Leakage: No Pads: No Fiber supplement: No  URINATION: Pain with urination: No Fully empty bladder: No - never Stream: Strong and Weak Urgency: Yes: worse with pelvic pain Frequency: not usually longer than every 2 hours, 1x per night Leakage: Urge to void, Coughing, and Sneezing only with very full bladder and this is rare, even less often will have small leak  standing after urinating.  Pads: No  INTERCOURSE: Pain with intercourse:  not active, but is painful but this isn't why not active Ability to have vaginal penetration:  Yes:   Climax: not painful Marinoff Scale: 0/3  PREGNANCY: Vaginal deliveries 0 Tearing No C-section deliveries 0 Currently pregnant No  PROLAPSE: None   OBJECTIVE:   DIAGNOSTIC FINDINGS:   COGNITION: Overall cognitive status: Within functional limits for tasks assessed     SENSATION: Light touch: Appears intact Proprioception: Appears intact  MUSCLE LENGTH: Bil hamstrings and adductors limited by 25%  POSTURE: rounded shoulders and forward head  PELVIC ALIGNMENT: WFL  LUMBARAROM/PROM:  A/PROM A/PROM  eval  Flexion Limited by 50%  Extension WFL  Right lateral flexion WFL  Left lateral flexion WFL  Right rotation Limited by 25%  Left rotation Limited by 25%   (Blank rows = not tested)  LOWER EXTREMITY ROM:  WFL  LOWER EXTREMITY MMT:  Bil hip abduction 3+/5, all other hips 4/5; knees 5/5 PALPATION:   General  tension at bil piriformis but no TTP, tension at bil lumbar paraspinals, mild fascial restrictions at hysterectomy scar site                External Perineal Exam no TTP, mild tension at medial gluteals                             Internal Pelvic Floor tension throughout superficial and deep pelvic floor, TTP at RT pelvic floor side  Patient confirms identification and approves PT to assess internal pelvic floor and treatment Yes No emotional/communication barriers or cognitive limitation. Patient is motivated to learn. Patient understands and agrees with treatment goals and plan. PT explains patient will be examined in standing, sitting, and lying down to see how their muscles and joints work. When they are ready, they will be asked to remove their underwear so PT can examine their perineum. The patient is also given the option of providing their own chaperone as one is not provided  in our facility. The patient also has the right and is explained the right to defer or refuse any part of the evaluation or treatment including the internal exam. With the patient's consent, PT will use one gloved finger to gently assess the muscles of the pelvic  floor, seeing how well it contracts and relaxes and if there is muscle symmetry. After, the patient will get dressed and PT and patient will discuss exam findings and plan of care. PT and patient discuss plan of care, schedule, attendance policy and HEP activities.   PELVIC MMT:   MMT eval  Vaginal Unable to gain contraction or bulge of pelvic floor due to tension  Internal Anal Sphincter   External Anal Sphincter   Puborectalis   Diastasis Recti   (Blank rows = not tested)        TONE: Increased   PROLAPSE: Not seen in hooklying at anterior or posterior with cough   TODAY'S TREATMENT:                                                                                                                              DATE:   03/31/23: Nustep 6 mins L7 2x30s unilateral hamstring stretch 2x30s piriformis stretch Sitting on ball - 2x10 pelvic circles bil, tilts x10 ant/post, x10 bil circles Standing 4# OHP 2x5, OHP with opp march 4# 2x5 [employer] carry 500' x4 (2 reps with Rt and 2 with LT 9#) Squats 2x10 body weights     PATIENT EDUCATION:  Education details: [id number] Person educated: Patient Education method: Explanation, Demonstration, Tactile cues, Verbal cues, and Handouts Education comprehension: verbalized understanding and returned demonstration  HOME EXERCISE PROGRAM: [id number]  ASSESSMENT:  CLINICAL IMPRESSION: Patient presents for treatment with focus on relaxation techniques for pelvic floor mobility, as pt is having no pain and has been able to relax pelvic floor consistently now, and progressed with strengthening today. Pt tolerated well, no pain and reports felt relax of pelvic floor post each contraction today.   Pt does still report symptoms similar to eval and would benefit from additional PT to further address deficits.      OBJECTIVE IMPAIRMENTS: decreased coordination, decreased endurance, decreased mobility, decreased strength, increased fascial restrictions, increased muscle spasms, impaired flexibility, improper body mechanics, postural dysfunction, and pain.   ACTIVITY LIMITATIONS: carrying, lifting, continence, and locomotion level  PARTICIPATION LIMITATIONS: community activity  PERSONAL FACTORS: Time since onset of injury/illness/exacerbation are also affecting patient's functional outcome.   REHAB POTENTIAL: Good  CLINICAL DECISION MAKING: Stable/uncomplicated  EVALUATION COMPLEXITY: Low   GOALS: Goals reviewed with patient? Yes  SHORT TERM GOALS: Target date: 04/13/23  Pt to be I with HEP.  Baseline: Goal status: MET  2.  Pt will have 25% less urgency due to bladder retraining and strengthening  Baseline:  Goal status: MET  3.  Pt to report improved time between bladder voids to at least 3 hours for improved QOL with decreased urinary frequency.   Baseline:  Goal status: MET  4.  Pt to demonstrate improved ability to coordination pelvic floor and breathing mechanics for decreased tension at pelvic floor and improved relaxation for decreased pain at least 50% of the time. Baseline:  Goal status: MET  5.  Pt  to be I with voiding mechanics for improved bladder evacuations. Baseline:  Goal status: MET   LONG TERM GOALS: Target date: 06/16/23  Pt to be I with advanced HEP.  Baseline:  Goal status: on going  2.  Pt to demonstrate at least 5/5 bil hip strength for improved pelvic stability and functional squats without pelvic pain.  Baseline:  Goal status: MET  3.  Pt to demonstrate improved ability to coordination pelvic floor and breathing mechanics for decreased tension at pelvic floor and improved relaxation for decreased pain at least 75% of the time. Baseline:   Goal status: on going  4.  Pt will have 50% less urgency due to bladder retraining and strengthening   Baseline:  Goal status: MET  5.  Pt to demonstrate improved coordination of pelvic floor and breathing mechanics with 10# squat with appropriate synergistic patterns to decrease pain and leakage at least 75% of the time.    Baseline:  Goal status: on going  6.  Pt to demonstrate at least 4/5 pelvic floor strength and ability to fully relax pelvic floor after each rep and bulge for improved pelvic stability and decreased strain at pelvic floor/ decrease leakage.  Baseline:  Goal status: on going  PLAN:  PT FREQUENCY: 1x/week  PT DURATION:  8 sessions  PLANNED INTERVENTIONS: Therapeutic exercises, Therapeutic activity, Neuromuscular re-education, Patient/Family education, Self Care, Joint mobilization, DME instructions, Aquatic Therapy, Dry Needling, Spinal mobilization, Cryotherapy, Moist heat, scar mobilization, Taping, Biofeedback, and Manual therapy  PLAN FOR NEXT SESSION: internal as needed for improved mobility/relaxation at pelvic floor, hip/spine/core/pelvic floor mobility/stretching, core and hip strengthening, pelvic floor strengthening once relaxation improved, voiding and breathing mechanics, pelvic floor relaxation techniques, wand education  Otelia Sergeant, PT, DPT 08/20/243:35 PM

## 2023-05-19 ENCOUNTER — Ambulatory Visit: Payer: PPO | Admitting: Physical Therapy

## 2023-05-19 DIAGNOSIS — M25551 Pain in right hip: Secondary | ICD-10-CM | POA: Diagnosis not present

## 2023-05-19 DIAGNOSIS — M62838 Other muscle spasm: Secondary | ICD-10-CM

## 2023-05-19 DIAGNOSIS — R279 Unspecified lack of coordination: Secondary | ICD-10-CM

## 2023-05-19 DIAGNOSIS — M6281 Muscle weakness (generalized): Secondary | ICD-10-CM

## 2023-05-19 NOTE — Therapy (Signed)
OUTPATIENT PHYSICAL THERAPY FEMALE PELVIC EVALUATION   Patient Name: [name] MRN: [id number] DOB:Jun 27, 1951, 72 y.o., female Today's Date: 05/19/2023  END OF SESSION:  PT End of Session - 05/19/23 1406     Visit Number 6    Date for PT Re-Evaluation 06/16/23    Authorization Type [employer]    PT Start Time 1400    PT Stop Time 1439    PT Time Calculation (min) 39 min    Activity Tolerance Patient tolerated treatment well    Behavior During Therapy WFL for tasks assessed/performed              Past Medical History:  Diagnosis Date   Anxiety    When I fly   GERD (gastroesophageal reflux disease)    Hyperlipidemia    Osteoporosis    osteoporosis   Past Surgical History:  Procedure Laterality Date   ABDOMINAL HYSTERECTOMY     APPENDECTOMY  2006   BALLOON DILATION N/A 11/25/2021   Procedure: BALLOON DILATION;  Surgeon: Tressia Danas, MD;  Location: [facility];  Service: Gastroenterology;  Laterality: N/A;   BIOPSY  11/25/2021   Procedure: BIOPSY;  Surgeon: Tressia Danas, MD;  Location: [facility];  Service: Gastroenterology;;   disc ectomy     ESOPHAGOGASTRODUODENOSCOPY (EGD) WITH PROPOFOL N/A 11/25/2021   Procedure: ESOPHAGOGASTRODUODENOSCOPY (EGD) WITH PROPOFOL;  Surgeon: Tressia Danas, MD;  Location: [facility];  Service: Gastroenterology;  Laterality: N/A;   pheumothorax      PLEURAL SCARIFICATION     TONSILECTOMY/ADENOIDECTOMY WITH MYRINGOTOMY     Patient Active Problem List   Diagnosis Date Noted   Acute gastric ulcer without hemorrhage or perforation    Difficult intubation 10/15/2021   Gastroesophageal reflux disease 09/27/2021   Dysphagia 09/27/2021   Endometriosis 06/04/2021   Atrophic vaginitis 06/04/2021   Reduced libido 06/04/2021   Hyperlipidemia, mild 12/03/2020   Trigger thumb, left thumb 11/09/2019   Stress incontinence 06/30/2018   S/P total abdominal hysterectomy 10/03/2016   Abnormal mammogram  02/05/2016   Osteoporosis without current pathological fracture 12/13/2015   Nonspecific abnormal electrocardiogram (ECG) (EKG) 09/27/2013   Collagenous colitis 09/23/2003    PCP: Jarold Motto, PA  REFERRING PROVIDER: Selmer Dominion, NP  REFERRING DIAG: R35.0 (ICD-10-CM) - Urinary frequency M62.89 (ICD-10-CM) - Pelvic floor dysfunction M62.838 (ICD-10-CM) - Levator spasm  THERAPY DIAG:  Muscle weakness (generalized)  Unspecified lack of coordination  Other muscle spasm  Rationale for Evaluation and Treatment: Rehabilitation  ONSET DATE: a few years  SUBJECTIVE:  SUBJECTIVE STATEMENT: Lt adductor/medial pelvic area having pain today after lifting a heavy sewing table, some other moderately heavy lifting for meals on wheels.  Fluid intake: Yes: a lot of fluid in the morning, unsure exact amounts does see increased urine frequency with am and less fluids in afternoon    PAIN:  Are you having pain? No PRECAUTIONS: None  WEIGHT BEARING RESTRICTIONS: No  FALLS:  Has patient fallen in last 6 months? No  LIVING ENVIRONMENT: Lives with: lives with their family Lives in: House/apartment   OCCUPATION: retired  PLOF: Independent  PATIENT GOALS: to have no pain  PERTINENT HISTORY:  Hysterectomy, Osteoporosis, Anxiety,  Sexual abuse: No  BOWEL MOVEMENT: Pain with bowel movement: No Type of bowel movement:Type (Bristol Stool Scale) 4, Frequency daily, and Strain No Fully empty rectum: Yes:   Leakage: No Pads: No Fiber supplement: No  URINATION: Pain with urination: No Fully empty bladder: No - never Stream: Strong and Weak Urgency: Yes: worse with pelvic pain Frequency: not usually longer than every 2 hours, 1x per night Leakage: Urge to void, Coughing, and Sneezing only  with very full bladder and this is rare, even less often will have small leak standing after urinating.  Pads: No  INTERCOURSE: Pain with intercourse:  not active, but is painful but this isn't why not active Ability to have vaginal penetration:  Yes:   Climax: not painful Marinoff Scale: 0/3  PREGNANCY: Vaginal deliveries 0 Tearing No C-section deliveries 0 Currently pregnant No  PROLAPSE: None   OBJECTIVE:   DIAGNOSTIC FINDINGS:   COGNITION: Overall cognitive status: Within functional limits for tasks assessed     SENSATION: Light touch: Appears intact Proprioception: Appears intact  MUSCLE LENGTH: Bil hamstrings and adductors limited by 25%  POSTURE: rounded shoulders and forward head  PELVIC ALIGNMENT: WFL  LUMBARAROM/PROM:  A/PROM A/PROM  eval  Flexion Limited by 50%  Extension WFL  Right lateral flexion WFL  Left lateral flexion WFL  Right rotation Limited by 25%  Left rotation Limited by 25%   (Blank rows = not tested)  LOWER EXTREMITY ROM:  WFL  LOWER EXTREMITY MMT:  Bil hip abduction 3+/5, all other hips 4/5; knees 5/5 PALPATION:   General  tension at bil piriformis but no TTP, tension at bil lumbar paraspinals, mild fascial restrictions at hysterectomy scar site                External Perineal Exam no TTP, mild tension at medial gluteals                             Internal Pelvic Floor tension throughout superficial and deep pelvic floor, TTP at RT pelvic floor side  Patient confirms identification and approves PT to assess internal pelvic floor and treatment Yes No emotional/communication barriers or cognitive limitation. Patient is motivated to learn. Patient understands and agrees with treatment goals and plan. PT explains patient will be examined in standing, sitting, and lying down to see how their muscles and joints work. When they are ready, they will be asked to remove their underwear so PT can examine their perineum. The patient is  also given the option of providing their own chaperone as one is not provided in our facility. The patient also has the right and is explained the right to defer or refuse any part of the evaluation or treatment including the internal exam. With the patient's consent, PT will use one gloved finger  to gently assess the muscles of the pelvic floor, seeing how well it contracts and relaxes and if there is muscle symmetry. After, the patient will get dressed and PT and patient will discuss exam findings and plan of care. PT and patient discuss plan of care, schedule, attendance policy and HEP activities.   PELVIC MMT:   MMT eval  Vaginal Unable to gain contraction or bulge of pelvic floor due to tension  Internal Anal Sphincter   External Anal Sphincter   Puborectalis   Diastasis Recti   (Blank rows = not tested)        TONE: Increased   PROLAPSE: Not seen in hooklying at anterior or posterior with cough   TODAY'S TREATMENT:                                                                                                                              DATE:   03/31/23: Manual - addaday level 2 at Lt adductor with good relief Hip abduction 3#  but increased tension at Lt side decreased to 2# and no pain 2x10 Adductor 2# sidelying x10 each Opp hand/knee press x10 Marjo Bicker pose stretch 2x30s Butterfly  2x30s  Piriformis stretch 2x30s each     PATIENT EDUCATION:  Education details: [id number] Person educated: Patient Education method: Explanation, Demonstration, Tactile cues, Verbal cues, and Handouts Education comprehension: verbalized understanding and returned demonstration  HOME EXERCISE PROGRAM: [email or web address]  ASSESSMENT:  CLINICAL IMPRESSION: Patient presents for treatment with focus on decreasing pain at lt adductor then progressed to stretching and then gentle strengthening. Pt tolerated well with this denied any pain at end of session or with strengthening exercises. Did benefit from  cues for relaxing into stretches and decreased tension in glutes and adductors intermittently throughout. Pt tolerated well, no pain and reports felt relax of pelvic floor post each contraction today.  Pt does still report symptoms similar to eval and would benefit from additional PT to further address deficits.      OBJECTIVE IMPAIRMENTS: decreased coordination, decreased endurance, decreased mobility, decreased strength, increased fascial restrictions, increased muscle spasms, impaired flexibility, improper body mechanics, postural dysfunction, and pain.   ACTIVITY LIMITATIONS: carrying, lifting, continence, and locomotion level  PARTICIPATION LIMITATIONS: community activity  PERSONAL FACTORS: Time since onset of injury/illness/exacerbation are also affecting patient's functional outcome.   REHAB POTENTIAL: Good  CLINICAL DECISION MAKING: Stable/uncomplicated  EVALUATION COMPLEXITY: Low   GOALS: Goals reviewed with patient? Yes  SHORT TERM GOALS: Target date: 04/13/23  Pt to be I with HEP.  Baseline: Goal status: MET  2.  Pt will have 25% less urgency due to bladder retraining and strengthening  Baseline:  Goal status: MET  3.  Pt to report improved time between bladder voids to at least 3 hours for improved QOL with decreased urinary frequency.   Baseline:  Goal status: MET  4.  Pt to demonstrate improved ability to coordination pelvic floor and breathing mechanics for decreased tension  at pelvic floor and improved relaxation for decreased pain at least 50% of the time. Baseline:  Goal status: MET  5.  Pt to be I with voiding mechanics for improved bladder evacuations. Baseline:  Goal status: MET   LONG TERM GOALS: Target date: 06/16/23  Pt to be I with advanced HEP.  Baseline:  Goal status: on going  2.  Pt to demonstrate at least 5/5 bil hip strength for improved pelvic stability and functional squats without pelvic pain.  Baseline:  Goal status: MET  3.  Pt  to demonstrate improved ability to coordination pelvic floor and breathing mechanics for decreased tension at pelvic floor and improved relaxation for decreased pain at least 75% of the time. Baseline:  Goal status: on going  4.  Pt will have 50% less urgency due to bladder retraining and strengthening   Baseline:  Goal status: MET  5.  Pt to demonstrate improved coordination of pelvic floor and breathing mechanics with 10# squat with appropriate synergistic patterns to decrease pain and leakage at least 75% of the time.    Baseline:  Goal status: on going  6.  Pt to demonstrate at least 4/5 pelvic floor strength and ability to fully relax pelvic floor after each rep and bulge for improved pelvic stability and decreased strain at pelvic floor/ decrease leakage.  Baseline:  Goal status: on going  PLAN:  PT FREQUENCY: 1x/week  PT DURATION:  8 sessions  PLANNED INTERVENTIONS: Therapeutic exercises, Therapeutic activity, Neuromuscular re-education, Patient/Family education, Self Care, Joint mobilization, DME instructions, Aquatic Therapy, Dry Needling, Spinal mobilization, Cryotherapy, Moist heat, scar mobilization, Taping, Biofeedback, and Manual therapy  PLAN FOR NEXT SESSION: internal as needed for improved mobility/relaxation at pelvic floor, hip/spine/core/pelvic floor mobility/stretching, core and hip strengthening, pelvic floor strengthening once relaxation improved, voiding and breathing mechanics, pelvic floor relaxation techniques, wand education  Otelia Sergeant, PT, DPT 08/27/243:42 PM

## 2023-05-20 ENCOUNTER — Ambulatory Visit
Admission: RE | Admit: 2023-05-20 | Discharge: 2023-05-20 | Disposition: A | Discharge status: Home / Self Care | Payer: PPO | Source: Ambulatory Visit | Attending: Physician Assistant | Admitting: Physician Assistant

## 2023-05-20 DIAGNOSIS — Z1231 Encounter for screening mammogram for malignant neoplasm of breast: Secondary | ICD-10-CM | POA: Diagnosis not present

## 2023-05-21 DIAGNOSIS — H2513 Age-related nuclear cataract, bilateral: Secondary | ICD-10-CM | POA: Diagnosis not present

## 2023-05-26 ENCOUNTER — Encounter: Payer: Self-pay | Admitting: Obstetrics and Gynecology

## 2023-05-26 ENCOUNTER — Ambulatory Visit: Payer: PPO | Admitting: Obstetrics and Gynecology

## 2023-05-26 VITALS — BP 109/64 | HR 77

## 2023-05-26 DIAGNOSIS — R350 Frequency of micturition: Secondary | ICD-10-CM | POA: Diagnosis not present

## 2023-05-26 DIAGNOSIS — N952 Postmenopausal atrophic vaginitis: Secondary | ICD-10-CM | POA: Diagnosis not present

## 2023-05-26 DIAGNOSIS — M6289 Other specified disorders of muscle: Secondary | ICD-10-CM | POA: Diagnosis not present

## 2023-05-26 DIAGNOSIS — M62838 Other muscle spasm: Secondary | ICD-10-CM

## 2023-05-26 NOTE — Progress Notes (Signed)
[facility] Return Visit  SUBJECTIVE  History of Present Illness: [name] is a 72 y.o. female seen in follow-up for levator spasm and urinary leakage. Plan at last visit was change Yuvafem to Vaginal estrogen cream and start physical therapy. She reports she is pleased with her results of pelvic floor PT and has been working diligently on the exercises from [facility], PT.   Overall she reports she is doing well and wants to continue on her current path.   Past Medical History: Patient  has a past medical history of Anxiety, GERD (gastroesophageal reflux disease), Hyperlipidemia, and Osteoporosis.   Past Surgical History: She  has a past surgical history that includes disc ectomy; Tonsilectomy/adenoidectomy with myringotomy; Pleural scarification; pheumothorax ; Appendectomy (2006); Abdominal hysterectomy; Esophagogastroduodenoscopy (egd) with propofol (N/A, 11/25/2021); Balloon dilation (N/A, 11/25/2021); and biopsy (11/25/2021).   Medications: She has a current medication list which includes the following prescription(s): aspirin, calcium carbonate-vitamin d, diazepam, doxycycline, estradiol, ibandronate, metronidazole, multivitamin, pantoprazole, rosuvastatin, and turmeric, and the following Facility-Administered Medications: sodium chloride.   Allergies: Patient has No Known Allergies.   Social History: Patient  reports that she quit smoking about 27 years ago. Her smoking use included cigarettes. She started smoking about 55 years ago. She has a 42 pack-year smoking history. She has never used smokeless tobacco. She reports that she does not drink alcohol and does not use drugs.      OBJECTIVE     Physical Exam: Vitals:   05/26/23 1403  BP: 109/64  Pulse: 77   Gen: No apparent distress, A&O x 3.  Detailed Urogynecologic Evaluation:  Deferred. Prior exam showed:    ASSESSMENT AND PLAN    Ms. Reither is a 72 y.o. with:  1. Pelvic floor dysfunction    2. Levator spasm   3. Vaginal atrophy   4. Urinary frequency    Patient reports she is planning to continue pelvic floor PT.  Patient has been specifically working on the muscle spasms and has been able to massage the areas. She feels empowered to massage those regions and reports if she needs further treatment she can let us know. Briefly discussed trigger point injections and muscle relaxants so she is aware of treatment options should she need them.  Patient is doing well on the estrogen cream and reports it is more cost effective for her.  Patient feels her frequency is much improved and she has not suffered from leakage much at all recently.   Patient to return as needed.

## 2023-05-27 ENCOUNTER — Ambulatory Visit: Payer: PPO | Attending: Obstetrics and Gynecology | Admitting: Physical Therapy

## 2023-05-27 DIAGNOSIS — R279 Unspecified lack of coordination: Secondary | ICD-10-CM | POA: Diagnosis not present

## 2023-05-27 DIAGNOSIS — M6281 Muscle weakness (generalized): Secondary | ICD-10-CM | POA: Insufficient documentation

## 2023-05-27 DIAGNOSIS — M62838 Other muscle spasm: Secondary | ICD-10-CM | POA: Diagnosis not present

## 2023-05-27 NOTE — Therapy (Signed)
OUTPATIENT PHYSICAL THERAPY FEMALE PELVIC EVALUATION   Patient Name: [name] MRN: [id number] DOB:09/02/1951, 72 y.o., female Today's Date: 05/27/2023  END OF SESSION:  PT End of Session - 05/27/23 0806     Visit Number 7    Date for PT Re-Evaluation 06/16/23    Authorization Type [employer]    PT Start Time 0801    PT Stop Time 0842    PT Time Calculation (min) 41 min    Activity Tolerance Patient tolerated treatment well    Behavior During Therapy WFL for tasks assessed/performed              Past Medical History:  Diagnosis Date   Anxiety    When I fly   GERD (gastroesophageal reflux disease)    Hyperlipidemia    Osteoporosis    osteoporosis   Past Surgical History:  Procedure Laterality Date   ABDOMINAL HYSTERECTOMY     APPENDECTOMY  2006   BALLOON DILATION N/A 11/25/2021   Procedure: BALLOON DILATION;  Surgeon: Tressia Danas, MD;  Location: [facility];  Service: Gastroenterology;  Laterality: N/A;   BIOPSY  11/25/2021   Procedure: BIOPSY;  Surgeon: Tressia Danas, MD;  Location: [facility];  Service: Gastroenterology;;   disc ectomy     ESOPHAGOGASTRODUODENOSCOPY (EGD) WITH PROPOFOL N/A 11/25/2021   Procedure: ESOPHAGOGASTRODUODENOSCOPY (EGD) WITH PROPOFOL;  Surgeon: Tressia Danas, MD;  Location: [facility];  Service: Gastroenterology;  Laterality: N/A;   pheumothorax      PLEURAL SCARIFICATION     TONSILECTOMY/ADENOIDECTOMY WITH MYRINGOTOMY     Patient Active Problem List   Diagnosis Date Noted   Acute gastric ulcer without hemorrhage or perforation    Difficult intubation 10/15/2021   Gastroesophageal reflux disease 09/27/2021   Dysphagia 09/27/2021   Endometriosis 06/04/2021   Atrophic vaginitis 06/04/2021   Reduced libido 06/04/2021   Hyperlipidemia, mild 12/03/2020   Trigger thumb, left thumb 11/09/2019   Stress incontinence 06/30/2018   S/P total abdominal hysterectomy 10/03/2016   Abnormal mammogram  02/05/2016   Osteoporosis without current pathological fracture 12/13/2015   Nonspecific abnormal electrocardiogram (ECG) (EKG) 09/27/2013   Collagenous colitis 09/23/2003    PCP: Jarold Motto, PA  REFERRING PROVIDER: Selmer Dominion, NP  REFERRING DIAG: R35.0 (ICD-10-CM) - Urinary frequency M62.89 (ICD-10-CM) - Pelvic floor dysfunction M62.838 (ICD-10-CM) - Levator spasm  THERAPY DIAG:  Muscle weakness (generalized)  Unspecified lack of coordination  Other muscle spasm  Rationale for Evaluation and Treatment: Rehabilitation  ONSET DATE: a few years  SUBJECTIVE:  SUBJECTIVE STATEMENT: Occasionally will have pain at Lt pelvis but not as often and not last as long.  No longer needs to stop to urinate while on routes.   Fluid intake: Yes: a lot of fluid in the morning, unsure exact amounts does see increased urine frequency with am and less fluids in afternoon    PAIN:  Are you having pain? No PRECAUTIONS: None  WEIGHT BEARING RESTRICTIONS: No  FALLS:  Has patient fallen in last 6 months? No  LIVING ENVIRONMENT: Lives with: lives with their family Lives in: House/apartment   OCCUPATION: retired  PLOF: Independent  PATIENT GOALS: to have no pain  PERTINENT HISTORY:  Hysterectomy, Osteoporosis, Anxiety,  Sexual abuse: No  BOWEL MOVEMENT: Pain with bowel movement: No Type of bowel movement:Type (Bristol Stool Scale) 4, Frequency daily, and Strain No Fully empty rectum: Yes:   Leakage: No Pads: No Fiber supplement: No  URINATION: Pain with urination: No Fully empty bladder: No - never Stream: Strong and Weak Urgency: Yes: worse with pelvic pain Frequency: not usually longer than every 2 hours, 1x per night Leakage: Urge to void, Coughing, and Sneezing only with very  full bladder and this is rare, even less often will have small leak standing after urinating.  Pads: No  INTERCOURSE: Pain with intercourse:  not active, but is painful but this isn't why not active Ability to have vaginal penetration:  Yes:   Climax: not painful Marinoff Scale: 0/3  PREGNANCY: Vaginal deliveries 0 Tearing No C-section deliveries 0 Currently pregnant No  PROLAPSE: None   OBJECTIVE:   DIAGNOSTIC FINDINGS:   COGNITION: Overall cognitive status: Within functional limits for tasks assessed     SENSATION: Light touch: Appears intact Proprioception: Appears intact  MUSCLE LENGTH: Bil hamstrings and adductors limited by 25%  POSTURE: rounded shoulders and forward head  PELVIC ALIGNMENT: WFL  LUMBARAROM/PROM:  A/PROM A/PROM  eval  Flexion Limited by 50%  Extension WFL  Right lateral flexion WFL  Left lateral flexion WFL  Right rotation Limited by 25%  Left rotation Limited by 25%   (Blank rows = not tested)  LOWER EXTREMITY ROM:  WFL  LOWER EXTREMITY MMT:  Bil hip abduction 3+/5, all other hips 4/5; knees 5/5 PALPATION:   General  tension at bil piriformis but no TTP, tension at bil lumbar paraspinals, mild fascial restrictions at hysterectomy scar site                External Perineal Exam no TTP, mild tension at medial gluteals                             Internal Pelvic Floor tension throughout superficial and deep pelvic floor, TTP at RT pelvic floor side  Patient confirms identification and approves PT to assess internal pelvic floor and treatment Yes No emotional/communication barriers or cognitive limitation. Patient is motivated to learn. Patient understands and agrees with treatment goals and plan. PT explains patient will be examined in standing, sitting, and lying down to see how their muscles and joints work. When they are ready, they will be asked to remove their underwear so PT can examine their perineum. The patient is also given  the option of providing their own chaperone as one is not provided in our facility. The patient also has the right and is explained the right to defer or refuse any part of the evaluation or treatment including the internal exam. With the patient's consent,  PT will use one gloved finger to gently assess the muscles of the pelvic floor, seeing how well it contracts and relaxes and if there is muscle symmetry. After, the patient will get dressed and PT and patient will discuss exam findings and plan of care. PT and patient discuss plan of care, schedule, attendance policy and HEP activities.   PELVIC MMT:   MMT eval  Vaginal Unable to gain contraction or bulge of pelvic floor due to tension  Internal Anal Sphincter   External Anal Sphincter   Puborectalis   Diastasis Recti   (Blank rows = not tested)        TONE: Increased   PROLAPSE: Not seen in hooklying at anterior or posterior with cough   TODAY'S TREATMENT:                                                                                                                              DATE:   05/27/23:  Butterfly  2x30s  Hamstring stretch 2x30s each Leg press 50# x10, 70# x10 Dead lift 20# 2x10 Farmers carry 15# 300' x4 each side Bird dogs 2x10 2x10 unilateral dead bugs each side Quad green band open rotation 2x10 each Standing alt marching with 4# held at shoulder height with ext elbows 2x10 each Mario punch 2x10 4#   PATIENT EDUCATION:  Education details: [id number] Person educated: Patient Education method: Programmer, multimedia, Demonstration, Tactile cues, Verbal cues, and Handouts Education comprehension: verbalized understanding and returned demonstration  HOME EXERCISE PROGRAM: [email or web address]  ASSESSMENT:  CLINICAL IMPRESSION: Patient presents for treatment with focus on strengthening of hip and core and pelvic floor with cues for relaxing pelvic floor post each rep. Pt tolerated well denied any pain throughout. Pt demonstrated good  technique and only needed minimal cues. Pt continues to progress toward all goals. Deferred reassessing internal today as her symptoms are continuing to improve. Pt does still report symptoms similar to eval and would benefit from additional PT to further address deficits.      OBJECTIVE IMPAIRMENTS: decreased coordination, decreased endurance, decreased mobility, decreased strength, increased fascial restrictions, increased muscle spasms, impaired flexibility, improper body mechanics, postural dysfunction, and pain.   ACTIVITY LIMITATIONS: carrying, lifting, continence, and locomotion level  PARTICIPATION LIMITATIONS: community activity  PERSONAL FACTORS: Time since onset of injury/illness/exacerbation are also affecting patient's functional outcome.   REHAB POTENTIAL: Good  CLINICAL DECISION MAKING: Stable/uncomplicated  EVALUATION COMPLEXITY: Low   GOALS: Goals reviewed with patient? Yes  SHORT TERM GOALS: Target date: 04/13/23  Pt to be I with HEP.  Baseline: Goal status: MET  2.  Pt will have 25% less urgency due to bladder retraining and strengthening  Baseline:  Goal status: MET  3.  Pt to report improved time between bladder voids to at least 3 hours for improved QOL with decreased urinary frequency.   Baseline:  Goal status: MET  4.  Pt to demonstrate improved ability to coordination pelvic floor and breathing mechanics  for decreased tension at pelvic floor and improved relaxation for decreased pain at least 50% of the time. Baseline:  Goal status: MET  5.  Pt to be I with voiding mechanics for improved bladder evacuations. Baseline:  Goal status: MET   LONG TERM GOALS: Target date: 06/16/23  Pt to be I with advanced HEP.  Baseline:  Goal status: MET  2.  Pt to demonstrate at least 5/5 bil hip strength for improved pelvic stability and functional squats without pelvic pain.  Baseline:  Goal status: MET  3.  Pt to demonstrate improved ability to  coordination pelvic floor and breathing mechanics for decreased tension at pelvic floor and improved relaxation for decreased pain at least 75% of the time. Baseline:  Goal status: on going  4.  Pt will have 50% less urgency due to bladder retraining and strengthening   Baseline:  Goal status: MET  5.  Pt to demonstrate improved coordination of pelvic floor and breathing mechanics with 10# squat with appropriate synergistic patterns to decrease pain and leakage at least 75% of the time.    Baseline:  Goal status: on going  6.  Pt to demonstrate at least 4/5 pelvic floor strength and ability to fully relax pelvic floor after each rep and bulge for improved pelvic stability and decreased strain at pelvic floor/ decrease leakage.  Baseline:  Goal status: on going  PLAN:  PT FREQUENCY: 1x/week  PT DURATION:  8 sessions  PLANNED INTERVENTIONS: Therapeutic exercises, Therapeutic activity, Neuromuscular re-education, Patient/Family education, Self Care, Joint mobilization, DME instructions, Aquatic Therapy, Dry Needling, Spinal mobilization, Cryotherapy, Moist heat, scar mobilization, Taping, Biofeedback, and Manual therapy  PLAN FOR NEXT SESSION: internal as needed for improved mobility/relaxation at pelvic floor, hip/spine/core/pelvic floor mobility/stretching, core and hip strengthening, pelvic floor strengthening once relaxation improved, voiding and breathing mechanics, pelvic floor relaxation techniques, wand education  Otelia Sergeant, PT, DPT 09/04/248:52 AM

## 2023-06-03 ENCOUNTER — Encounter: Payer: PPO | Admitting: Physical Therapy

## 2023-06-08 ENCOUNTER — Ambulatory Visit
Admission: RE | Admit: 2023-06-08 | Discharge: 2023-06-08 | Disposition: A | Discharge status: Home / Self Care | Payer: PPO | Source: Ambulatory Visit | Attending: Physician Assistant

## 2023-06-08 DIAGNOSIS — G4452 New daily persistent headache (NDPH): Secondary | ICD-10-CM

## 2023-06-08 DIAGNOSIS — R519 Headache, unspecified: Secondary | ICD-10-CM | POA: Diagnosis not present

## 2023-06-10 ENCOUNTER — Ambulatory Visit: Payer: PPO | Admitting: Physical Therapy

## 2023-06-10 DIAGNOSIS — M6281 Muscle weakness (generalized): Secondary | ICD-10-CM | POA: Diagnosis not present

## 2023-06-10 DIAGNOSIS — M62838 Other muscle spasm: Secondary | ICD-10-CM

## 2023-06-10 DIAGNOSIS — R279 Unspecified lack of coordination: Secondary | ICD-10-CM

## 2023-06-10 NOTE — Therapy (Signed)
OUTPATIENT PHYSICAL THERAPY FEMALE PELVIC EVALUATION   Patient Name: [name] MRN: [id number] DOB:09/16/1951, 72 y.o., female Today's Date: 06/10/2023  END OF SESSION:  PT End of Session - 06/10/23 0803     Visit Number 8    Date for PT Re-Evaluation 06/16/23    Authorization Type [employer]    PT Start Time 0800    PT Stop Time 0838    PT Time Calculation (min) 38 min    Activity Tolerance Patient tolerated treatment well    Behavior During Therapy WFL for tasks assessed/performed              Past Medical History:  Diagnosis Date   Anxiety    When I fly   GERD (gastroesophageal reflux disease)    Hyperlipidemia    Osteoporosis    osteoporosis   Past Surgical History:  Procedure Laterality Date   ABDOMINAL HYSTERECTOMY     APPENDECTOMY  2006   BALLOON DILATION N/A 11/25/2021   Procedure: BALLOON DILATION;  Surgeon: Tressia Danas, MD;  Location: [facility];  Service: Gastroenterology;  Laterality: N/A;   BIOPSY  11/25/2021   Procedure: BIOPSY;  Surgeon: Tressia Danas, MD;  Location: [facility];  Service: Gastroenterology;;   disc ectomy     ESOPHAGOGASTRODUODENOSCOPY (EGD) WITH PROPOFOL N/A 11/25/2021   Procedure: ESOPHAGOGASTRODUODENOSCOPY (EGD) WITH PROPOFOL;  Surgeon: Tressia Danas, MD;  Location: [facility];  Service: Gastroenterology;  Laterality: N/A;   pheumothorax      PLEURAL SCARIFICATION     TONSILECTOMY/ADENOIDECTOMY WITH MYRINGOTOMY     Patient Active Problem List   Diagnosis Date Noted   Acute gastric ulcer without hemorrhage or perforation    Difficult intubation 10/15/2021   Gastroesophageal reflux disease 09/27/2021   Dysphagia 09/27/2021   Endometriosis 06/04/2021   Atrophic vaginitis 06/04/2021   Reduced libido 06/04/2021   Hyperlipidemia, mild 12/03/2020   Trigger thumb, left thumb 11/09/2019   Stress incontinence 06/30/2018   S/P total abdominal hysterectomy 10/03/2016   Abnormal mammogram  02/05/2016   Osteoporosis without current pathological fracture 12/13/2015   Nonspecific abnormal electrocardiogram (ECG) (EKG) 09/27/2013   Collagenous colitis 09/23/2003    PCP: Jarold Motto, PA  REFERRING PROVIDER: Selmer Dominion, NP  REFERRING DIAG: R35.0 (ICD-10-CM) - Urinary frequency M62.89 (ICD-10-CM) - Pelvic floor dysfunction M62.838 (ICD-10-CM) - Levator spasm  THERAPY DIAG:  Muscle weakness (generalized)  Unspecified lack of coordination  Other muscle spasm  Rationale for Evaluation and Treatment: Rehabilitation  ONSET DATE: a few years  SUBJECTIVE:  SUBJECTIVE STATEMENT: Haven't had any pain but did need to lift kayak and did struggle getting out of it onto a floating dock and had a little pain after this but it went away.   Fluid intake: Yes: a lot of fluid in the morning, unsure exact amounts does see increased urine frequency with am and less fluids in afternoon    PAIN:  Are you having pain? No PRECAUTIONS: None  WEIGHT BEARING RESTRICTIONS: No  FALLS:  Has patient fallen in last 6 months? No  LIVING ENVIRONMENT: Lives with: lives with their family Lives in: House/apartment   OCCUPATION: retired  PLOF: Independent  PATIENT GOALS: to have no pain  PERTINENT HISTORY:  Hysterectomy, Osteoporosis, Anxiety,  Sexual abuse: No  BOWEL MOVEMENT: Pain with bowel movement: No Type of bowel movement:Type (Bristol Stool Scale) 4, Frequency daily, and Strain No Fully empty rectum: Yes:   Leakage: No Pads: No Fiber supplement: No  URINATION: Pain with urination: No Fully empty bladder: No - never Stream: Strong and Weak Urgency: Yes: worse with pelvic pain Frequency: not usually longer than every 2 hours, 1x per night Leakage: Urge to void, Coughing, and  Sneezing only with very full bladder and this is rare, even less often will have small leak standing after urinating.  Pads: No  INTERCOURSE: Pain with intercourse:  not active, but is painful but this isn't why not active Ability to have vaginal penetration:  Yes:   Climax: not painful Marinoff Scale: 0/3  PREGNANCY: Vaginal deliveries 0 Tearing No C-section deliveries 0 Currently pregnant No  PROLAPSE: None   OBJECTIVE:   DIAGNOSTIC FINDINGS:   COGNITION: Overall cognitive status: Within functional limits for tasks assessed     SENSATION: Light touch: Appears intact Proprioception: Appears intact  MUSCLE LENGTH: Bil hamstrings and adductors limited by 25%  POSTURE: rounded shoulders and forward head  PELVIC ALIGNMENT: WFL  LUMBARAROM/PROM:  A/PROM A/PROM  eval  Flexion Limited by 50%  Extension WFL  Right lateral flexion WFL  Left lateral flexion WFL  Right rotation Limited by 25%  Left rotation Limited by 25%   (Blank rows = not tested)  LOWER EXTREMITY ROM:  WFL  LOWER EXTREMITY MMT:  Bil hip abduction 3+/5, all other hips 4/5; knees 5/5 PALPATION:   General  tension at bil piriformis but no TTP, tension at bil lumbar paraspinals, mild fascial restrictions at hysterectomy scar site                External Perineal Exam no TTP, mild tension at medial gluteals                             Internal Pelvic Floor tension throughout superficial and deep pelvic floor, TTP at RT pelvic floor side  Patient confirms identification and approves PT to assess internal pelvic floor and treatment Yes No emotional/communication barriers or cognitive limitation. Patient is motivated to learn. Patient understands and agrees with treatment goals and plan. PT explains patient will be examined in standing, sitting, and lying down to see how their muscles and joints work. When they are ready, they will be asked to remove their underwear so PT can examine their perineum.  The patient is also given the option of providing their own chaperone as one is not provided in our facility. The patient also has the right and is explained the right to defer or refuse any part of the evaluation or treatment including the internal  exam. With the patient's consent, PT will use one gloved finger to gently assess the muscles of the pelvic floor, seeing how well it contracts and relaxes and if there is muscle symmetry. After, the patient will get dressed and PT and patient will discuss exam findings and plan of care. PT and patient discuss plan of care, schedule, attendance policy and HEP activities.   PELVIC MMT:   MMT eval  Vaginal Unable to gain contraction or bulge of pelvic floor due to tension  Internal Anal Sphincter   External Anal Sphincter   Puborectalis   Diastasis Recti   (Blank rows = not tested)        TONE: Increased   PROLAPSE: Not seen in hooklying at anterior or posterior with cough   TODAY'S TREATMENT:                                                                                                                              DATE:   06/10/23: Pt requests to build HEP for a daily routine that is no more than 15 mins for her improved carry over. This was completed and listed below.  Pt completed HEP I with PT present however pt demonstrated good technique with all stretches. Pt denied all questions and reports good understanding.      PATIENT EDUCATION:  Education details: [id number] Person educated: Patient Education method: Explanation, Demonstration, Tactile cues, Verbal cues, and Handouts Education comprehension: verbalized understanding and returned demonstration  HOME EXERCISE PROGRAM: [email or web address] Access Code: [email or web address] URL: [email or web address] Date: 06/10/2023 Prepared by: [facility]  Exercises - Supine Diaphragmatic Breathing  - 1 x daily - 7 x weekly - 1 sets - 10 reps - Supine  Hip Internal and External Rotation  - 1 x daily - 7 x weekly - 1 sets - 10 reps - 5s holds - Supported Teacher, music with Pelvic Floor Relaxation  - 1 x daily - 7 x weekly - 1 sets - 3 reps - 30s holds - [employer]  - 1 x daily - 7 x weekly - 1 sets - 3 reps - 30s holds - V Sit Hip Adductor Hamstring Stretch  - 1 x daily - 7 x weekly - 1 sets - 3 reps - 30s holds - Child's Pose Stretch  - 1 x daily - 7 x weekly - 1 sets - 3 reps - 30s holds - Seated Cat Cow  - 1 x daily - 7 x weekly - 1 sets - 10 reps - Seated Pelvic Tilt  - 1 x daily - 7 x weekly - 1 sets - 10 reps - Seated Pelvic Circles on Balance Disc  - 1 x daily - 7 x weekly - 1 sets - 10 reps - Seated Happy Baby With Trunk Flexion For Pelvic Relaxation  - 1 x daily - 7 x weekly - 1 sets - 3 reps - 30s holds - Seated  Piriformis Stretch with Trunk Bend  - 1 x daily - 7 x weekly - 1 sets - 3 reps - 30s holds  ASSESSMENT:  CLINICAL IMPRESSION: Patient presents for treatment with focus on review of HEP and updating it. Pt was I with HEP and reports good understanding of this, denied questions. Has met all goals except for internal reassessment as she deferred internal with her symptoms improving. Pt comfortable with DC today and understands she will need a new PT referral for future PT needs. This will serve as DC from pt post treatment today.   OBJECTIVE IMPAIRMENTS: decreased coordination, decreased endurance, decreased mobility, decreased strength, increased fascial restrictions, increased muscle spasms, impaired flexibility, improper body mechanics, postural dysfunction, and pain.   ACTIVITY LIMITATIONS: carrying, lifting, continence, and locomotion level  PARTICIPATION LIMITATIONS: community activity  PERSONAL FACTORS: Time since onset of injury/illness/exacerbation are also affecting patient's functional outcome.   REHAB POTENTIAL: Good  CLINICAL DECISION MAKING: Stable/uncomplicated  EVALUATION COMPLEXITY:  Low   GOALS: Goals reviewed with patient? Yes  SHORT TERM GOALS: Target date: 04/13/23  Pt to be I with HEP.  Baseline: Goal status: MET  2.  Pt will have 25% less urgency due to bladder retraining and strengthening  Baseline:  Goal status: MET  3.  Pt to report improved time between bladder voids to at least 3 hours for improved QOL with decreased urinary frequency.   Baseline:  Goal status: MET  4.  Pt to demonstrate improved ability to coordination pelvic floor and breathing mechanics for decreased tension at pelvic floor and improved relaxation for decreased pain at least 50% of the time. Baseline:  Goal status: MET  5.  Pt to be I with voiding mechanics for improved bladder evacuations. Baseline:  Goal status: MET   LONG TERM GOALS: Target date: 06/16/23  Pt to be I with advanced HEP.  Baseline:  Goal status: MET  2.  Pt to demonstrate at least 5/5 bil hip strength for improved pelvic stability and functional squats without pelvic pain.  Baseline:  Goal status: MET  3.  Pt to demonstrate improved ability to coordination pelvic floor and breathing mechanics for decreased tension at pelvic floor and improved relaxation for decreased pain at least 75% of the time. Baseline:  Goal status: MET  4.  Pt will have 50% less urgency due to bladder retraining and strengthening   Baseline:  Goal status: MET  5.  Pt to demonstrate improved coordination of pelvic floor and breathing mechanics with 10# squat with appropriate synergistic patterns to decrease pain and leakage at least 75% of the time.    Baseline:  Goal status: MET  6.  Pt to demonstrate at least 4/5 pelvic floor strength and ability to fully relax pelvic floor after each rep and bulge for improved pelvic stability and decreased strain at pelvic floor/ decrease leakage.  Baseline:  Goal status: deferred - pt's symptoms improving and declined internal today.   PLAN:  PT FREQUENCY: 1x/week  PT DURATION:  8  sessions  PLANNED INTERVENTIONS: Therapeutic exercises, Therapeutic activity, Neuromuscular re-education, Patient/Family education, Self Care, Joint mobilization, DME instructions, Aquatic Therapy, Dry Needling, Spinal mobilization, Cryotherapy, Moist heat, scar mobilization, Taping, Biofeedback, and Manual therapy  PLAN FOR NEXT SESSION:   PHYSICAL THERAPY DISCHARGE SUMMARY  Visits from Start of Care: 8  Current functional level related to goals / functional outcomes: All goals met except internal strength goal as pt deferred internal    Remaining deficits: After high activity levels pt  reports tension noted in Lt adductor and mild pain but resolves with stretching and rest   Education / Equipment: HEP   Patient agrees to discharge. Patient goals were partially met. Patient is being discharged due to being pleased with the current functional level.  Otelia Sergeant, PT, DPT 09/18/248:47 AM

## 2023-06-16 DIAGNOSIS — L820 Inflamed seborrheic keratosis: Secondary | ICD-10-CM | POA: Diagnosis not present

## 2023-06-17 ENCOUNTER — Encounter: Payer: Self-pay | Admitting: Physician Assistant

## 2023-06-21 ENCOUNTER — Other Ambulatory Visit: Payer: Self-pay | Admitting: Physician Assistant

## 2023-07-13 ENCOUNTER — Ambulatory Visit (INDEPENDENT_AMBULATORY_CARE_PROVIDER_SITE_OTHER): Payer: PPO

## 2023-07-13 VITALS — Wt 125.0 lb

## 2023-07-13 DIAGNOSIS — Z0000 Encounter for general adult medical examination without abnormal findings: Secondary | ICD-10-CM

## 2023-07-13 NOTE — Patient Instructions (Signed)
[name] , Thank you for taking time to come for your Medicare Wellness Visit. I appreciate your ongoing commitment to your health goals. Please review the following plan we discussed and let me know if I can assist you in the future.   Referrals/Orders/Follow-Ups/Clinician Recommendations: Aim for 30 minutes of exercise or brisk walking, 6-8 glasses of water, and 5 servings of fruits and vegetables each day. Continue to work toward losing a few pounds    This is a list of the screening recommended for you and due dates:  Health Maintenance  Topic Date Due   Flu Shot  08/08/2023   Medicare Annual Wellness Visit  07/12/2024   DEXA scan (bone density measurement)  04/23/2025   Mammogram  05/19/2025   DTaP/Tdap/Td vaccine (2 - Td or Tdap) 10/08/2025   Colon Cancer Screening  11/21/2027   Pneumonia Vaccine  Completed   COVID-19 Vaccine  Completed   Hepatitis C Screening  Completed   Zoster (Shingles) Vaccine  Completed   HPV Vaccine  Aged Out    Advanced directives: (Copy Requested) Please bring a copy of your health care power of attorney and living will to the office to be added to your chart at your convenience.  Next Medicare Annual Wellness Visit scheduled for next year: Yes

## 2023-07-13 NOTE — Progress Notes (Signed)
Subjective:   [name] is a 72 y.o. female who presents for Medicare Annual (Subsequent) preventive examination.  Visit Complete: Virtual I connected with  [name] on 07/13/23 by a audio enabled telemedicine application and verified that I am speaking with the correct person using two identifiers.  Patient Location: Home  Provider Location: Office/Clinic  I discussed the limitations of evaluation and management by telemedicine. The patient expressed understanding and agreed to proceed.  Vital Signs: Because this visit was a virtual/telehealth visit, some criteria may be missing or patient reported. Any vitals not documented were not able to be obtained and vitals that have been documented are patient reported.  Patient Medicare AWV questionnaire was completed by the patient on 07/06/23; I have confirmed that all information answered by patient is correct and no changes since this date.  Cardiac Risk Factors include: advanced age (>6men, >38 women);dyslipidemia     Objective:    Today's Vitals   07/13/23 0747  Weight: 125 lb (56.7 kg)   Body mass index is 22.14 kg/m.     07/13/2023    7:53 AM 03/16/2023   11:13 AM 07/07/2022    8:11 AM 11/25/2021    7:12 AM 10/14/2021    7:37 PM 07/21/2021    8:13 AM 06/20/2021    3:21 PM  Advanced Directives  Does Patient Have a Medical Advance Directive? Yes Yes Yes No No Yes Yes  Type of Estate agent of [place];Living will  Healthcare Power of [place];Living will   Healthcare Power of [place];Living will Healthcare Power of [place];Living will  Does patient want to make changes to medical advance directive?  No - Patient declined    No - Patient declined No - Patient declined  Copy of Healthcare Power of Attorney in Chart? No - copy requested  No - copy requested   Yes - validated most recent copy scanned in chart (See row information) Yes - validated most recent copy scanned in chart (See row  information)  Would patient like information on creating a medical advance directive?    No - Patient declined No - Patient declined      Current Medications (verified) Outpatient Encounter Medications as of 07/13/2023  Medication Sig   aspirin 81 MG tablet Take 81 mg by mouth daily.   Calcium Carbonate-Vitamin D (CALTRATE 600+D PO) Take 1 tablet by mouth daily.   estradiol (ESTRACE) 0.1 MG/GM vaginal cream Place 0.5 g vaginally 2 (two) times a week. Place 0.5g nightly for two weeks then twice a week after   ibandronate (BONIVA) 150 MG tablet TAKE 1 TABLET ONCE A MONTH ON AN EMPTY STOMACH WITH WATER. STAND OR WALK FOR 30 MINS   Multiple Vitamin (MULTIVITAMIN) capsule Take 1 capsule by mouth daily.   pantoprazole (PROTONIX) 40 MG tablet TAKE 1 TABLET BY MOUTH TWICE A DAY   rosuvastatin (CRESTOR) 5 MG tablet TAKE 1 TABLET (5 MG TOTAL) BY MOUTH DAILY.   SPIKEVAX syringe    TURMERIC PO Take by mouth.   diazepam (VALIUM) 5 MG tablet Take 1 tablet (5 mg total) by mouth every 12 (twelve) hours as needed for anxiety Corporate treasurer Darius Bump). (Patient not taking: Reported on 07/13/2023)   doxycycline (ADOXA) 50 MG tablet Take 50 mg by mouth daily as needed (rosacea). (Patient not taking: Reported on 07/13/2023)   metroNIDAZOLE (METROCREAM) 0.75 % cream Apply 1 application  topically as needed. (Patient not taking: Reported on 07/13/2023)   Facility-Administered Encounter Medications as of 07/13/2023  Medication   0.9 %  sodium chloride infusion    Allergies (verified) Patient has no known allergies.   History: Past Medical History:  Diagnosis Date   Anxiety    When I fly   GERD (gastroesophageal reflux disease)    Hyperlipidemia    Osteoporosis    osteoporosis   Past Surgical History:  Procedure Laterality Date   ABDOMINAL HYSTERECTOMY     APPENDECTOMY  2006   BALLOON DILATION N/A 11/25/2021   Procedure: BALLOON DILATION;  Surgeon: Tressia Danas, MD;  Location: [facility];   Service: Gastroenterology;  Laterality: N/A;   BIOPSY  11/25/2021   Procedure: BIOPSY;  Surgeon: Tressia Danas, MD;  Location: [facility];  Service: Gastroenterology;;   disc ectomy     ESOPHAGOGASTRODUODENOSCOPY (EGD) WITH PROPOFOL N/A 11/25/2021   Procedure: ESOPHAGOGASTRODUODENOSCOPY (EGD) WITH PROPOFOL;  Surgeon: Tressia Danas, MD;  Location: [facility];  Service: Gastroenterology;  Laterality: N/A;   pheumothorax      PLEURAL SCARIFICATION     TONSILECTOMY/ADENOIDECTOMY WITH MYRINGOTOMY     Family History  Problem Relation Age of Onset   Heart disease Mother    Cancer Mother        breast cancer   Breast cancer Mother    Cancer Father        myo dysplasia   Hyperlipidemia Brother    Hypertension Brother    Mental illness Brother    Other Brother        pre-diabetes   Cancer Maternal Grandmother        cervical or uterine   Cancer Paternal Grandmother        ovarian   Cancer Paternal Grandfather        prostate   Esophageal cancer Maternal Aunt    Colon cancer Neg Hx    Pancreatic cancer Neg Hx    Social History   Socioeconomic History   Marital status: Married    Spouse name: Leydy Whoolery   Number of children: Not on file   Years of education: Not on file   Highest education level: Not on file  Occupational History   Occupation: Retired  Tobacco Use   Smoking status: Former    Current packs/day: 0.00    Average packs/day: 1.5 packs/day for 28.0 years (42.0 ttl pk-yrs)    Types: Cigarettes    Start date: 09/23/1967    Quit date: 09/23/1995    Years since quitting: 27.8   Smokeless tobacco: Never  Vaping Use   Vaping status: Never Used  Substance and Sexual Activity   Alcohol use: No   Drug use: No   Sexual activity: Not Currently  Other Topics Concern   Not on file  Social History Narrative   Married   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 [employer]   Social Determinants of [employer]: Low Risk  (07/06/2023)   Overall Financial Resource Strain (CARDIA)    Difficulty of Paying Living Expenses: Not hard at all  Food Insecurity: No Food Insecurity (07/06/2023)   Hunger Vital Sign    Worried About Running Out of Food in the Last Year: Never true    Ran Out of Food in the Last Year: Never true  Transportation Needs: No Transportation Needs (07/06/2023)   PRAPARE - Administrator, Civil Service (Medical): No    Lack of Transportation (Non-Medical): No  Physical Activity: Sufficiently Active (07/06/2023)   Exercise Vital  Sign    Days of Exercise per Week: 7 days    Minutes of Exercise per Session: 60 min  Stress: No Stress Concern Present (07/06/2023)   [employer] of Occupational Health - Occupational Stress Questionnaire    Feeling of Stress : Not at all  Social Connections: Moderately Integrated (07/06/2023)   Social Connection and Isolation Panel [NHANES]    Frequency of Communication with Friends and Family: More than three times a week    Frequency of Social Gatherings with Friends and Family: Three times a week    Attends Religious Services: Never    Active Member of Clubs or Organizations: Yes    Attends Engineer, structural: More than 4 times per year    Marital Status: Married    Tobacco Counseling Counseling given: Not Answered   Clinical Intake:  Pre-visit preparation completed: Yes  Pain : No/denies pain     BMI - recorded: 22.14 Nutritional Status: BMI of 19-24  Normal Nutritional Risks: None Diabetes: No  How often do you need to have someone help you when you read instructions, pamphlets, or other written materials from your doctor or pharmacy?: 1 - Never  Interpreter Needed?: No  Information entered by :: Lanier Ensign, LPN   Activities of Daily Living    07/06/2023   11:46 AM  In your present state of health, do you have any difficulty performing the following activities:  Hearing? 0   Vision? 0  Difficulty concentrating or making decisions? 0  Walking or climbing stairs? 0  Dressing or bathing? 0  Doing errands, shopping? 0  Preparing Food and eating ? N  Using the Toilet? N  In the past six months, have you accidently leaked urine? N  Do you have problems with loss of bowel control? N  Managing your Medications? N  Managing your Finances? N  Housekeeping or managing your Housekeeping? N    Patient Care Team: Jarold Motto, [place] as PCP - General (Physician Assistant) Philip Aspen, DO as Consulting Physician (Obstetrics and Gynecology) Venancio Poisson, MD as Consulting Physician (Dermatology)  Indicate any recent Medical Services you may have received from other than [facility] providers in the past year (date may be approximate).     Assessment:   This is a routine wellness examination for Melyssa.  Hearing/Vision screen Hearing Screening - Comments:: Pt denies any hearing issues  Vision Screening - Comments:: Pt follows up with Dr Randon Goldsmith for annual eye exams    Goals Addressed             This Visit's Progress    Patient Stated       Lose a few pounds        Depression Screen    07/13/2023    7:51 AM 01/09/2023    1:42 PM 07/07/2022    8:10 AM 06/20/2021    3:22 PM 06/20/2021    3:18 PM 06/04/2021    2:24 PM 12/03/2020    1:36 PM  PHQ 2/9 Scores  PHQ - 2 Score 0 0 0 0 0 0 0    Fall Risk    07/06/2023   11:46 AM 07/07/2022    8:12 AM 06/20/2021    3:21 PM 12/03/2020    1:37 PM 06/30/2018    1:58 PM  Fall Risk   Falls in the past year? 0 0 0 0 No  Number falls in past yr:  0 0    Injury with Fall?  0 0  Risk for fall due to : Impaired vision Impaired vision No Fall Risks    Follow up Falls prevention discussed Falls prevention discussed       MEDICARE RISK AT HOME: Medicare Risk at Home Any stairs in or around the home?: Yes If so, are there any without handrails?: No Home free of loose throw rugs in walkways, pet beds, electrical  cords, etc?: Yes Adequate lighting in your home to reduce risk of falls?: Yes Life alert?: No Use of a cane, walker or w/c?: No Grab bars in the bathroom?: Yes Shower chair or bench in shower?: No Elevated toilet seat or a handicapped toilet?: No  TIMED UP AND GO:  Was the test performed?  No    Cognitive Function:        07/13/2023    7:54 AM 07/07/2022    8:14 AM 06/20/2021    3:23 PM  6CIT Screen  What Year? 0 points 0 points 0 points  What month? 0 points 0 points 0 points  What time? 0 points 0 points 0 points  Count back from 20 0 points 0 points 0 points  Months in reverse 0 points 0 points 0 points  Repeat phrase 0 points 0 points 0 points  Total Score 0 points 0 points 0 points    Immunizations Immunization History  Administered Date(s) Administered   Fluad Quad(high Dose 65+) 06/04/2021, 08/07/2022   Influenza Inj Mdck Quad With Preservative 06/22/2017, 06/22/2018   Influenza Split 06/07/2012   Influenza, High Dose Seasonal PF 06/30/2018, 06/17/2020   Influenza, Seasonal, Injecte, Preservative Fre 08/22/2015   Influenza,inj,Quad PF,6+ Mos 08/14/2014, 06/18/2017   Influenza,inj,quad, With Preservative 06/25/2016   Influenza-Unspecified 07/24/2015, 05/23/2016, 06/22/2016, 08/07/2022   Moderna Covid-19 Fall Seasonal Vaccine 72yrs & older 08/07/2022, 04/06/2023   Moderna Sars-Covid-2 Vaccination 04/06/2023   PFIZER Comirnaty(Gray Top)Covid-19 Tri-Sucrose Vaccine 01/21/2021   PFIZER(Purple Top)SARS-COV-2 Vaccination 10/12/2019, 11/02/2019, 06/29/2020   Pfizer Covid-19 Vaccine Bivalent Booster 20yrs & up 09/02/2021   Pfizer(Comirnaty)Fall Seasonal Vaccine 12 years and older 08/07/2022   Pneumococcal Conjugate-13 06/30/2018   Pneumococcal Polysaccharide-23 03/21/2020   Tdap 10/09/2015   Zoster Recombinant(Shingrix) 07/09/2021, 11/14/2021    TDAP status: Up to date  Flu Vaccine status: Due, Education has been provided regarding the importance of this vaccine.  Advised may receive this vaccine at local pharmacy or Health Dept. Aware to provide a copy of the vaccination record if obtained from local pharmacy or Health Dept. Verbalized acceptance and understanding.  Pneumococcal vaccine status: Up to date  Covid-19 vaccine status: Completed vaccines  Qualifies for Shingles Vaccine? Yes   Zostavax completed Yes   Shingrix Completed?: Yes  Screening Tests Health Maintenance  Topic Date Due   INFLUENZA VACCINE  08/08/2023   Medicare Annual Wellness (AWV)  07/12/2024   DEXA SCAN  04/23/2025   MAMMOGRAM  05/19/2025   DTaP/Tdap/Td (2 - Td or Tdap) 10/08/2025   Colonoscopy  11/21/2027   Pneumonia Vaccine 33+ Years old  Completed   COVID-19 Vaccine  Completed   Hepatitis C Screening  Completed   Zoster Vaccines- Shingrix  Completed   HPV VACCINES  Aged Out    Health Maintenance  There are no preventive care reminders to display for this patient.   Colorectal cancer screening: Type of screening: Colonoscopy. Completed 11/20/17. Repeat every 10 years  Mammogram status: Completed 05/20/23. Repeat every year  Bone Density status: Completed 04/23/22. Results reflect: Bone density results: OSTEOPOROSIS. Repeat every 3 years.   Additional Screening:  Hepatitis C  Screening:  Completed 10/09/15  Vision Screening: Recommended annual ophthalmology exams for early detection of glaucoma and other disorders of the eye. Is the patient up to date with their annual eye exam?  Yes  Who is the provider or what is the name of the office in which the patient attends annual eye exams? Dr Randon Goldsmith  If pt is not established with a provider, would they like to be referred to a provider to establish care? No .   Dental Screening: Recommended annual dental exams for proper oral hygiene  Community Resource Referral / Chronic Care Management: CRR required this visit?  No   CCM required this visit?  No     Plan:     I have personally reviewed and noted the  following in the patient's chart:   Medical and social history Use of alcohol, tobacco or illicit drugs  Current medications and supplements including opioid prescriptions. Patient is not currently taking opioid prescriptions. Functional ability and status Nutritional status Physical activity Advanced directives List of other physicians Hospitalizations, surgeries, and ER visits in previous 12 months Vitals Screenings to include cognitive, depression, and falls Referrals and appointments  In addition, I have reviewed and discussed with patient certain preventive protocols, quality metrics, and best practice recommendations. A written personalized care plan for preventive services as well as general preventive health recommendations were provided to patient.     Marzella Schlein, LPN   [id number]   After Visit Summary: (MyChart) Due to this being a telephonic visit, the after visit summary with patients personalized plan was offered to patient via MyChart   Nurse Notes: none

## 2023-08-16 ENCOUNTER — Encounter: Payer: Self-pay | Admitting: Physician Assistant

## 2023-08-24 DIAGNOSIS — D220 Melanocytic nevi of lip: Secondary | ICD-10-CM | POA: Diagnosis not present

## 2023-08-24 DIAGNOSIS — D2272 Melanocytic nevi of left lower limb, including hip: Secondary | ICD-10-CM | POA: Diagnosis not present

## 2023-08-24 DIAGNOSIS — L814 Other melanin hyperpigmentation: Secondary | ICD-10-CM | POA: Diagnosis not present

## 2023-08-24 DIAGNOSIS — D2271 Melanocytic nevi of right lower limb, including hip: Secondary | ICD-10-CM | POA: Diagnosis not present

## 2023-08-24 DIAGNOSIS — D2261 Melanocytic nevi of right upper limb, including shoulder: Secondary | ICD-10-CM | POA: Diagnosis not present

## 2023-08-24 DIAGNOSIS — D22121 Melanocytic nevi of left upper eyelid, including canthus: Secondary | ICD-10-CM | POA: Diagnosis not present

## 2023-08-24 DIAGNOSIS — D224 Melanocytic nevi of scalp and neck: Secondary | ICD-10-CM | POA: Diagnosis not present

## 2023-08-24 DIAGNOSIS — D225 Melanocytic nevi of trunk: Secondary | ICD-10-CM | POA: Diagnosis not present

## 2023-08-24 DIAGNOSIS — L821 Other seborrheic keratosis: Secondary | ICD-10-CM | POA: Diagnosis not present

## 2023-08-24 DIAGNOSIS — D1801 Hemangioma of skin and subcutaneous tissue: Secondary | ICD-10-CM | POA: Diagnosis not present

## 2023-09-24 ENCOUNTER — Ambulatory Visit: Payer: PPO | Admitting: Family

## 2023-09-24 VITALS — BP 120/77 | HR 98 | Temp 97.8°F | Ht 63.0 in | Wt 125.0 lb

## 2023-09-24 DIAGNOSIS — R059 Cough, unspecified: Secondary | ICD-10-CM

## 2023-09-24 LAB — POC COVID19 BINAXNOW: SARS Coronavirus 2 Ag: NEGATIVE

## 2023-09-24 NOTE — Progress Notes (Signed)
 Patient ID: [name], female    DOB: 07-23-1951, 73 y.o.   MRN: [id number]  Chief Complaint  Patient presents with   Sinus Problem    Neck ear pain on left side  Yellowish mucus when blowing nose    Cough    Dry    Discussed the use of AI scribe software for clinical note transcription with the patient, who gave verbal consent to proceed.  History of Present Illness   The patient, with a history of recurrent ear issues, presents with a week-long history of facial pain and discomfort. The pain, which started on New Year's Eve, is located on the side of the face and is severe enough to wake her up at night. The patient describes the pain as a 'taste of pain' and 'broken.' The pain is mainly located on the right side of the face, extending from the ear to the lower jaw. The patient also reports frequent ear popping and swallowing difficulties, which she attributes to recurrent ear issues. She denies any recent ear infections but mentions that she was told she had a lot of ear wax during her last visit. The patient also reports a sore throat, mainly on the left side. She denies any recent exposure to individuals with known illnesses.     Assessment & Plan:   Viral sinus Infection - Symptoms for approximately one week with left sided facial pain, left ear pressure and sore throat. No known exposure to COVID-19 and rapid test negative. -Continue OTC nasal saline spray tid. -Advise to use OTC generic Flonase , 1 squirt each side bid for 3d, then daily until sx improved. -Take Ibuprofen up to 600mg  tid prn pain. -Continue to monitor symptoms and call Monday if sx are not better.  Ear Discomfort - History of ear issues with current discomfort and pressure, possibly related to wax build-up. No signs of infection on examination. -Attempt to remove ear wax with curette.  -Verbal consent received to perform left ear lavage via Hydrogen peroxide/water mix solution. Curette used to remove  visible cerumen. Pt tolerated well, complete evacuation of all cerumen obtained. Mild erythema but no bleeding noted in ear canal after procedure.     Subjective:    Outpatient Medications Prior to Visit  Medication Sig Dispense Refill   aspirin 81 MG tablet Take 81 mg by mouth daily.     Calcium  Carbonate-Vitamin D  (CALTRATE 600+D PO) Take 1 tablet by mouth daily.     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 10 tablet 0   doxycycline  (ADOXA) 50 MG tablet Take 50 mg by mouth daily as needed (rosacea).     estradiol  (ESTRACE ) 0.1 MG/GM vaginal cream Place 0.5 g vaginally 2 (two) times a week. Place 0.5g nightly for two weeks then twice a week after 42.5 g 11   ibandronate  (BONIVA ) 150 MG tablet TAKE 1 TABLET ONCE A MONTH ON AN EMPTY STOMACH WITH WATER. STAND OR WALK FOR 30 MINS 3 tablet 7   metroNIDAZOLE (METROCREAM) 0.75 % cream Apply 1 application  topically as needed.     Multiple Vitamin (MULTIVITAMIN) capsule Take 1 capsule by mouth daily.     rosuvastatin  (CRESTOR ) 5 MG tablet TAKE 1 TABLET (5 MG TOTAL) BY MOUTH DAILY. 90 tablet 3   SPIKEVAX syringe      TURMERIC PO Take by mouth.     pantoprazole  (PROTONIX ) 40 MG tablet  TAKE 1 TABLET BY MOUTH TWICE A DAY 180 tablet 0   Facility-Administered Medications Prior to Visit  Medication Dose Route Frequency Provider Last Rate Last Admin   0.9 %  sodium chloride  infusion  500 mL Intravenous Continuous Eda Iha, MD       Past Medical History:  Diagnosis Date   Anxiety    When I fly   GERD (gastroesophageal reflux disease)    Hyperlipidemia    Osteoporosis    osteoporosis   Past Surgical History:  Procedure Laterality Date   ABDOMINAL HYSTERECTOMY     APPENDECTOMY  2006   BALLOON DILATION N/A 11/25/2021   Procedure: BALLOON DILATION;  Surgeon: Eda Iha, MD;  Location: [facility];  Service: Gastroenterology;  Laterality:  N/A;   BIOPSY  11/25/2021   Procedure: BIOPSY;  Surgeon: Eda Iha, MD;  Location: [facility];  Service: Gastroenterology;;   disc ectomy     ESOPHAGOGASTRODUODENOSCOPY (EGD) WITH PROPOFOL  N/A 11/25/2021   Procedure: ESOPHAGOGASTRODUODENOSCOPY (EGD) WITH PROPOFOL ;  Surgeon: Eda Iha, MD;  Location: [facility];  Service: Gastroenterology;  Laterality: N/A;   pheumothorax      PLEURAL SCARIFICATION     TONSILECTOMY/ADENOIDECTOMY WITH MYRINGOTOMY     No Known Allergies    Objective:    Physical Exam Vitals and nursing note reviewed.  Constitutional:      Appearance: Normal appearance. She is ill-appearing.     Interventions: Face mask in place.  HENT:     Right Ear: Tympanic membrane and ear canal normal.     Left Ear: Tympanic membrane normal. There is impacted cerumen (clear after evacuation, TM wnl).     Nose:     Right Sinus: Frontal sinus tenderness present.     Left Sinus: Frontal sinus tenderness present.     Mouth/Throat:     Mouth: Mucous membranes are moist.     Pharynx: Posterior oropharyngeal erythema (mild) present. No pharyngeal swelling, oropharyngeal exudate or uvula swelling.     Tonsils: No tonsillar exudate or tonsillar abscesses.  Cardiovascular:     Rate and Rhythm: Normal rate and regular rhythm.  Pulmonary:     Effort: Pulmonary effort is normal.     Breath sounds: Normal breath sounds.  Musculoskeletal:        General: Normal range of motion.  Lymphadenopathy:     Head:     Right side of head: No preauricular or posterior auricular adenopathy.     Left side of head: No preauricular or posterior auricular adenopathy.     Cervical: No cervical adenopathy.  Skin:    General: Skin is warm and dry.  Neurological:     Mental Status: She is alert.  Psychiatric:        Mood and Affect: Mood normal.        Behavior: Behavior normal.    BP 120/77   Pulse 98   Temp 97.8 F (36.6 C) (Temporal)   Ht 5' 3 (1.6 m)   Wt 125 lb (56.7  kg)   SpO2 98%   BMI 22.14 kg/m  Wt Readings from Last 3 Encounters:  09/24/23 125 lb (56.7 kg)  07/13/23 125 lb (56.7 kg)  04/29/23 125 lb (56.7 kg)       Lucius Krabbe, NP

## 2023-10-02 ENCOUNTER — Encounter: Payer: Self-pay | Admitting: Physician Assistant

## 2023-10-02 DIAGNOSIS — J0110 Acute frontal sinusitis, unspecified: Secondary | ICD-10-CM

## 2023-10-02 MED ORDER — AMOXICILLIN-POT CLAVULANATE 875-125 MG PO TABS: 1.0000 | ORAL_TABLET | Freq: Two times a day (BID) | ORAL | 0 refills | Status: DC

## 2023-10-02 NOTE — Telephone Encounter (Signed)
 sent Augmentin to pharmacy

## 2023-10-20 ENCOUNTER — Telehealth: Payer: Self-pay | Admitting: Gastroenterology

## 2023-10-20 MED ORDER — PANTOPRAZOLE SODIUM 40 MG PO TBEC: 40.0000 mg | DELAYED_RELEASE_TABLET | Freq: Two times a day (BID) | ORAL | 0 refills | Status: DC

## 2023-10-20 NOTE — Telephone Encounter (Signed)
Inbound call from patient stating she needs a refill for Protonix. Patient made an appointment for 4/10 at 10:10 with Dr. Adela Lank. Please advise.

## 2023-10-20 NOTE — Telephone Encounter (Signed)
Refill sent to pharmacy.

## 2024-01-15 ENCOUNTER — Encounter: Payer: Self-pay | Admitting: Gastroenterology

## 2024-01-15 ENCOUNTER — Ambulatory Visit: Payer: PPO | Admitting: Gastroenterology

## 2024-01-15 VITALS — BP 114/64 | HR 72 | Ht 63.0 in | Wt 125.1 lb

## 2024-01-15 DIAGNOSIS — K219 Gastro-esophageal reflux disease without esophagitis: Secondary | ICD-10-CM

## 2024-01-15 DIAGNOSIS — Z8711 Personal history of peptic ulcer disease: Secondary | ICD-10-CM

## 2024-01-15 DIAGNOSIS — Z79899 Other long term (current) drug therapy: Secondary | ICD-10-CM

## 2024-01-15 DIAGNOSIS — K31A0 Gastric intestinal metaplasia, unspecified: Secondary | ICD-10-CM | POA: Diagnosis not present

## 2024-01-15 MED ORDER — PANTOPRAZOLE SODIUM 20 MG PO TBEC: 20.0000 mg | DELAYED_RELEASE_TABLET | Freq: Every day | ORAL | 11 refills | Status: DC

## 2024-01-15 NOTE — H&P (View-Only) (Signed)
 HPI :  73 year old female, previously followed by Dr. Savannah Curlin, history of GERD, osteoporosis, gastric ulcers, gastric intestinal metaplasia, here to establish care.  She was last seen in April 2023.  She reports a history of reflux longstanding.  Her typical symptoms of actually been waterbrash, laryngitis, cough.  She has had occasional pyrosis but usually symptoms more localized to her throat that bother her from reflux.  She has reliably responded to antacids.  She had an EGD in 2023 for dysphagia and reflux.  She had no Barrett's esophagus, no obvious stenosis or stricture but she had an empiric dilation performed.  She did have a few ulcers in her stomach incidentally noted, largest 6 mm in size.  Biopsies taken showing no evidence of H. pylori but she did have gastric intestinal metaplasia.  She denies any family history of gastric cancer.  She had used rare NSAIDs at the time but was not using them frequently.  She does not use NSAIDs frequently currently.  Generally she does not have any postprandial pain that bothers her.  She is taking pantoprazole  40 mg daily.  Previously she was on 40 mg twice daily to control symptoms.  She typically has good control of her symptoms unless she eats chocolate which appears to be her trigger food for her reflux.  She denies any dysphagia.  She also endorses sensitivity to antibiotics, whenever she takes those her stomach burns intensely.  She cannot recently completed course of antibiotics for her sinus infection due to the symptoms.  She relates to me that she has osteoporosis, she is currently on Boniva  monthly and appears to be tolerating it well.  She never had a follow-up EGD after her endoscopy in 2023.  She reminds me she has a history of being a difficult intubation during her hysterectomy, her last EGD was done in the hospital setting due to this reason.   Prior workup:  EGD 11/2021:   - No endoscopic esophageal abnormality to explain patient's  dysphagia. Esophagus dilated. - Non-bleeding gastric ulcers with no stigmata of bleeding. Biopsied. - A few gastric polyps. Biopsied. - Erythematous duodenopathy.   A. STOMACH, GASTRIC, ANTRUM, BODY, FUNDUS, BIOPSY:  - Localized intestinal metaplasia.  No inflammation, dysplasia, or  malignancy.   B. STOMACH, POLYPECTOMY:  - Patchy chronic gastritis.  No intestinal metaplasia, dysplasia, or  malignancy.  Warthin-Starry stain negative for Helicobacter pylori.   C. ESOPHAGUS, DISTAL, BIOPSY:  - Squamous mucosa fragments.  No inflammation, dysplasia, or malignancy.  - Detached fragment of gastric mucosa.  No inflammation, intestinal  metaplasia, dysplasia or malignancy.   D. ESOPHAGUS, MID/PROXIMAL, BIOPSY:  - Benign squamous mucosa.  No inflammation, metaplasia, dysplasia or  malignancy.  - Incidental fragment of benign smooth muscle.      She has had colonoscopies with Dr. Andriette Keeling, the last in November 2018 at which time the study was normal and he recommended a repeat in 10 years.  She has never had polyps on any of her colonoscopies.   Past Medical History:  Diagnosis Date   Anxiety    When I fly   Gastric ulcer    GERD (gastroesophageal reflux disease)    Hyperlipidemia    Osteoporosis    osteoporosis     Past Surgical History:  Procedure Laterality Date   ABDOMINAL HYSTERECTOMY     APPENDECTOMY  2006   BALLOON DILATION N/A 11/25/2021   Procedure: BALLOON DILATION;  Surgeon: Lindle Rhea, MD;  Location: [facility];  Service: Gastroenterology;  Laterality:  N/A;   BIOPSY  11/25/2021   Procedure: BIOPSY;  Surgeon: Lindle Rhea, MD;  Location: [facility];  Service: Gastroenterology;;   disc ectomy     ESOPHAGOGASTRODUODENOSCOPY (EGD) WITH PROPOFOL  N/A 11/25/2021   Procedure: ESOPHAGOGASTRODUODENOSCOPY (EGD) WITH PROPOFOL ;  Surgeon: Lindle Rhea, MD;  Location: [facility];  Service: Gastroenterology;  Laterality: N/A;   pheumothorax      PLEURAL  SCARIFICATION     TONSILECTOMY/ADENOIDECTOMY WITH MYRINGOTOMY     Family History  Problem Relation Age of Onset   Heart disease Mother    Cancer Mother        breast cancer   Breast cancer Mother    Cancer Father        myo dysplasia   Hyperlipidemia Brother    Hypertension Brother    Mental illness Brother    Other Brother        pre-diabetes   Esophageal cancer Maternal Aunt    Cancer Maternal Grandmother        cervical or uterine   Cancer Paternal Grandmother        ovarian   Cancer Paternal Grandfather        prostate   Colon cancer Neg Hx    Pancreatic cancer Neg Hx    Stomach cancer Neg Hx    Social History   Tobacco Use   Smoking status: Former    Current packs/day: 0.00    Average packs/day: 1.5 packs/day for 28.0 years (42.0 ttl pk-yrs)    Types: Cigarettes    Start date: 09/23/1967    Quit date: 09/23/1995    Years since quitting: 28.3   Smokeless tobacco: Never  Vaping Use   Vaping status: Never Used  Substance Use Topics   Alcohol use: No   Drug use: No   Current Outpatient Medications  Medication Sig Dispense Refill   aspirin 81 MG tablet Take 81 mg by mouth daily.     Calcium  Carbonate-Vitamin D  (CALTRATE 600+D PO) Take 1 tablet by mouth daily.     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 10 tablet 0   estradiol  (ESTRACE ) 0.1 MG/GM vaginal cream Place 0.5 g vaginally 2 (two) times a week. Place 0.5g nightly for two weeks then twice a week after 42.5 g 11   ibandronate  (BONIVA ) 150 MG tablet TAKE 1 TABLET ONCE A MONTH ON AN EMPTY STOMACH WITH WATER. STAND OR WALK FOR 30 MINS 3 tablet 7   metroNIDAZOLE (METROCREAM) 0.75 % cream Apply 1 application  topically as needed.     Multiple Vitamin (MULTIVITAMIN) capsule Take 1 capsule by mouth daily.     pantoprazole  (PROTONIX ) 20 MG tablet Take 1 tablet (20 mg total) by mouth daily. 30 tablet 11   rosuvastatin  (CRESTOR )  5 MG tablet TAKE 1 TABLET (5 MG TOTAL) BY MOUTH DAILY. 90 tablet 3   SPIKEVAX syringe      TURMERIC PO Take by mouth.     Current Facility-Administered Medications  Medication Dose Route Frequency Provider Last Rate Last Admin   0.9 %  sodium chloride  infusion  500 mL Intravenous Continuous Lindle Rhea, MD       No Known Allergies   Review of Systems: All systems reviewed and negative except where noted in HPI.   Lab Results  Component Value Date   WBC 8.1 04/29/2023   HGB 12.8 04/29/2023   HCT 39.0 04/29/2023   MCV  83.6 04/29/2023   PLT 323.0 04/29/2023    Lab Results  Component Value Date   NA 138 04/29/2023   CL 100 04/29/2023   K 4.1 04/29/2023   CO2 31 04/29/2023   BUN 15 04/29/2023   CREATININE 0.68 04/29/2023   GFR 88.43 04/29/2023   CALCIUM  9.6 04/29/2023   ALBUMIN 4.4 04/29/2023   GLUCOSE 96 04/29/2023    Lab Results  Component Value Date   ALT 16 04/29/2023   AST 21 04/29/2023   ALKPHOS 54 04/29/2023   BILITOT 0.3 04/29/2023     Physical Exam: BP 114/64   Pulse 72   Ht 5\' 3"  (1.6 m)   Wt 125 lb 2 oz (56.8 kg)   SpO2 96%   BMI 22.16 kg/m  Constitutional: Pleasant,well-developed, female in no acute distress. Neurological: Alert and oriented to person place and time. Psychiatric: Normal mood and affect. Behavior is normal.   ASSESSMENT: 73 y.o. female here for assessment of the following  1. Gastroesophageal reflux disease, unspecified whether esophagitis present   2. Long-term current use of proton pump inhibitor therapy   3. History of gastric ulcer   4. Gastric intestinal metaplasia    History of reflux as outlined above -mostly cough, pharyngeal symptoms.  Well-controlled on pantoprazole  however she does have osteoporosis and on Boniva .  We discussed long-term plan.  I would like her to be on the lowest dose of PPI needed to control symptoms.  I reviewed risks benefits of chronic PPI use with her.  Her renal function is normal but  discussed with her that her fracture risk is higher than average with osteoporosis and using PPI on top of that.  I would like her to reduce her Protonix  to 20 mg once daily to see if that will control her symptoms and if so could try spacing it out to every other day or transitioning over to an H2 blocker if she tolerates that.  She is in agreement with this and will try that.  Fortunately no evidence of Barrett's.  In addition, we did discuss nonmedical therapies for treatment of reflux.  She inquires about dietary therapy and she would just need to avoid food triggers which can vary from person-to-person.  Chocolate is clearly a trigger for her.  Otherwise in light of her osteoporosis, could consider TIF procedure to try to get her off PPI.  We discussed briefly what this is.  EGD as below, will evaluate her candidacy for TIF if she wanted to consider that.  Otherwise I reviewed her endoscopic history.  She incidentally had multiple small gastric ulcers on EGD without clear cause.  She did have biopsies that were positive for gastric intestinal metaplasia.  No family history of gastric cancer.  We discussed these findings and if we wanted to do a surveillance exam to ensure healing of gastric ulcers and to do mapping for gastric intestinal metaplasia.  I discussed this with her and she does want to proceed with an EGD.  Recall she does have a history of difficult intubation with anesthesia and her case will be done at the hospital for anesthesia support.  All questions answered she agrees with the plan.  Of note due for colonoscopy for screening purposes in 2028.  PLAN: - schedule EGD at [facility] in light of difficult intubation - confirm healing of gastric ulcers, mapping for GIM - counseled on long term risks of chronic PPI - trial of reducing her protonix  from 40mg  to 20mg  - consideration for  TIF pending her course - counseled on GIM  Christi Coward, MD [facility]

## 2024-01-15 NOTE — Patient Instructions (Signed)
 Decrease your pantoprazole  to 20 mg daily. I have sent a new prescription to your pharmacy.   You have been scheduled for an endoscopy. Please follow written instructions given to you at your visit today.  If you use inhalers (even only as needed), please bring them with you on the day of your procedure.  If you take any of the following medications, they will need to be adjusted prior to your procedure:   DO NOT TAKE 7 DAYS PRIOR TO TEST- Trulicity (dulaglutide) Ozempic, Wegovy (semaglutide) Mounjaro (tirzepatide) Bydureon Bcise (exanatide extended release)  DO NOT TAKE 1 DAY PRIOR TO YOUR TEST Rybelsus (semaglutide) Adlyxin (lixisenatide) Victoza (liraglutide) Byetta (exanatide) ___________________________________________________________________________   If your blood pressure at your visit was 140/90 or greater, please contact your primary care physician to follow up on this.  _______________________________________________________  If you are age 37 or older, your body mass index should be between 23-30. Your Body mass index is 22.16 kg/m. If this is out of the aforementioned range listed, please consider follow up with your Primary Care Provider.  If you are age 10 or younger, your body mass index should be between 19-25. Your Body mass index is 22.16 kg/m. If this is out of the aformentioned range listed, please consider follow up with your Primary Care Provider.   ________________________________________________________  The [facility] providers would like to encourage you to use MYCHART to communicate with providers for non-urgent requests or questions.  Due to long hold times on the telephone, sending your provider a message by [facility] may be a faster and more efficient way to get a response.  Please allow 48 business hours for a response.  Please remember that this is for non-urgent requests.  _______________________________________________________

## 2024-01-15 NOTE — Progress Notes (Signed)
 HPI :  73 year old female, previously followed by Dr. Savannah Curlin, history of GERD, osteoporosis, gastric ulcers, gastric intestinal metaplasia, here to establish care.  She was last seen in April 2023.  She reports a history of reflux longstanding.  Her typical symptoms of actually been waterbrash, laryngitis, cough.  She has had occasional pyrosis but usually symptoms more localized to her throat that bother her from reflux.  She has reliably responded to antacids.  She had an EGD in 2023 for dysphagia and reflux.  She had no Barrett's esophagus, no obvious stenosis or stricture but she had an empiric dilation performed.  She did have a few ulcers in her stomach incidentally noted, largest 6 mm in size.  Biopsies taken showing no evidence of H. pylori but she did have gastric intestinal metaplasia.  She denies any family history of gastric cancer.  She had used rare NSAIDs at the time but was not using them frequently.  She does not use NSAIDs frequently currently.  Generally she does not have any postprandial pain that bothers her.  She is taking pantoprazole  40 mg daily.  Previously she was on 40 mg twice daily to control symptoms.  She typically has good control of her symptoms unless she eats chocolate which appears to be her trigger food for her reflux.  She denies any dysphagia.  She also endorses sensitivity to antibiotics, whenever she takes those her stomach burns intensely.  She cannot recently completed course of antibiotics for her sinus infection due to the symptoms.  She relates to me that she has osteoporosis, she is currently on Boniva  monthly and appears to be tolerating it well.  She never had a follow-up EGD after her endoscopy in 2023.  She reminds me she has a history of being a difficult intubation during her hysterectomy, her last EGD was done in the hospital setting due to this reason.   Prior workup:  EGD 11/2021:   - No endoscopic esophageal abnormality to explain patient's  dysphagia. Esophagus dilated. - Non-bleeding gastric ulcers with no stigmata of bleeding. Biopsied. - A few gastric polyps. Biopsied. - Erythematous duodenopathy.   A. STOMACH, GASTRIC, ANTRUM, BODY, FUNDUS, BIOPSY:  - Localized intestinal metaplasia.  No inflammation, dysplasia, or  malignancy.   B. STOMACH, POLYPECTOMY:  - Patchy chronic gastritis.  No intestinal metaplasia, dysplasia, or  malignancy.  Warthin-Starry stain negative for Helicobacter pylori.   C. ESOPHAGUS, DISTAL, BIOPSY:  - Squamous mucosa fragments.  No inflammation, dysplasia, or malignancy.  - Detached fragment of gastric mucosa.  No inflammation, intestinal  metaplasia, dysplasia or malignancy.   D. ESOPHAGUS, MID/PROXIMAL, BIOPSY:  - Benign squamous mucosa.  No inflammation, metaplasia, dysplasia or  malignancy.  - Incidental fragment of benign smooth muscle.      She has had colonoscopies with Dr. Andriette Keeling, the last in November 2018 at which time the study was normal and he recommended a repeat in 10 years.  She has never had polyps on any of her colonoscopies.   Past Medical History:  Diagnosis Date   Anxiety    When I fly   Gastric ulcer    GERD (gastroesophageal reflux disease)    Hyperlipidemia    Osteoporosis    osteoporosis     Past Surgical History:  Procedure Laterality Date   ABDOMINAL HYSTERECTOMY     APPENDECTOMY  2006   BALLOON DILATION N/A 11/25/2021   Procedure: BALLOON DILATION;  Surgeon: Lindle Rhea, MD;  Location: [facility];  Service: Gastroenterology;  Laterality:  N/A;   BIOPSY  11/25/2021   Procedure: BIOPSY;  Surgeon: Lindle Rhea, MD;  Location: [facility];  Service: Gastroenterology;;   disc ectomy     ESOPHAGOGASTRODUODENOSCOPY (EGD) WITH PROPOFOL  N/A 11/25/2021   Procedure: ESOPHAGOGASTRODUODENOSCOPY (EGD) WITH PROPOFOL ;  Surgeon: Lindle Rhea, MD;  Location: [facility];  Service: Gastroenterology;  Laterality: N/A;   pheumothorax      PLEURAL  SCARIFICATION     TONSILECTOMY/ADENOIDECTOMY WITH MYRINGOTOMY     Family History  Problem Relation Age of Onset   Heart disease Mother    Cancer Mother        breast cancer   Breast cancer Mother    Cancer Father        myo dysplasia   Hyperlipidemia Brother    Hypertension Brother    Mental illness Brother    Other Brother        pre-diabetes   Esophageal cancer Maternal Aunt    Cancer Maternal Grandmother        cervical or uterine   Cancer Paternal Grandmother        ovarian   Cancer Paternal Grandfather        prostate   Colon cancer Neg Hx    Pancreatic cancer Neg Hx    Stomach cancer Neg Hx    Social History   Tobacco Use   Smoking status: Former    Current packs/day: 0.00    Average packs/day: 1.5 packs/day for 28.0 years (42.0 ttl pk-yrs)    Types: Cigarettes    Start date: 09/23/1967    Quit date: 09/23/1995    Years since quitting: 28.3   Smokeless tobacco: Never  Vaping Use   Vaping status: Never Used  Substance Use Topics   Alcohol use: No   Drug use: No   Current Outpatient Medications  Medication Sig Dispense Refill   aspirin 81 MG tablet Take 81 mg by mouth daily.     Calcium  Carbonate-Vitamin D  (CALTRATE 600+D PO) Take 1 tablet by mouth daily.     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 10 tablet 0   estradiol  (ESTRACE ) 0.1 MG/GM vaginal cream Place 0.5 g vaginally 2 (two) times a week. Place 0.5g nightly for two weeks then twice a week after 42.5 g 11   ibandronate  (BONIVA ) 150 MG tablet TAKE 1 TABLET ONCE A MONTH ON AN EMPTY STOMACH WITH WATER. STAND OR WALK FOR 30 MINS 3 tablet 7   metroNIDAZOLE (METROCREAM) 0.75 % cream Apply 1 application  topically as needed.     Multiple Vitamin (MULTIVITAMIN) capsule Take 1 capsule by mouth daily.     pantoprazole  (PROTONIX ) 20 MG tablet Take 1 tablet (20 mg total) by mouth daily. 30 tablet 11   rosuvastatin  (CRESTOR )  5 MG tablet TAKE 1 TABLET (5 MG TOTAL) BY MOUTH DAILY. 90 tablet 3   SPIKEVAX syringe      TURMERIC PO Take by mouth.     Current Facility-Administered Medications  Medication Dose Route Frequency Provider Last Rate Last Admin   0.9 %  sodium chloride  infusion  500 mL Intravenous Continuous Lindle Rhea, MD       No Known Allergies   Review of Systems: All systems reviewed and negative except where noted in HPI.   Lab Results  Component Value Date   WBC 8.1 04/29/2023   HGB 12.8 04/29/2023   HCT 39.0 04/29/2023   MCV  83.6 04/29/2023   PLT 323.0 04/29/2023    Lab Results  Component Value Date   NA 138 04/29/2023   CL 100 04/29/2023   K 4.1 04/29/2023   CO2 31 04/29/2023   BUN 15 04/29/2023   CREATININE 0.68 04/29/2023   GFR 88.43 04/29/2023   CALCIUM  9.6 04/29/2023   ALBUMIN 4.4 04/29/2023   GLUCOSE 96 04/29/2023    Lab Results  Component Value Date   ALT 16 04/29/2023   AST 21 04/29/2023   ALKPHOS 54 04/29/2023   BILITOT 0.3 04/29/2023     Physical Exam: BP 114/64   Pulse 72   Ht 5\' 3"  (1.6 m)   Wt 125 lb 2 oz (56.8 kg)   SpO2 96%   BMI 22.16 kg/m  Constitutional: Pleasant,well-developed, female in no acute distress. Neurological: Alert and oriented to person place and time. Psychiatric: Normal mood and affect. Behavior is normal.   ASSESSMENT: 73 y.o. female here for assessment of the following  1. Gastroesophageal reflux disease, unspecified whether esophagitis present   2. Long-term current use of proton pump inhibitor therapy   3. History of gastric ulcer   4. Gastric intestinal metaplasia    History of reflux as outlined above -mostly cough, pharyngeal symptoms.  Well-controlled on pantoprazole  however she does have osteoporosis and on Boniva .  We discussed long-term plan.  I would like her to be on the lowest dose of PPI needed to control symptoms.  I reviewed risks benefits of chronic PPI use with her.  Her renal function is normal but  discussed with her that her fracture risk is higher than average with osteoporosis and using PPI on top of that.  I would like her to reduce her Protonix  to 20 mg once daily to see if that will control her symptoms and if so could try spacing it out to every other day or transitioning over to an H2 blocker if she tolerates that.  She is in agreement with this and will try that.  Fortunately no evidence of Barrett's.  In addition, we did discuss nonmedical therapies for treatment of reflux.  She inquires about dietary therapy and she would just need to avoid food triggers which can vary from person-to-person.  Chocolate is clearly a trigger for her.  Otherwise in light of her osteoporosis, could consider TIF procedure to try to get her off PPI.  We discussed briefly what this is.  EGD as below, will evaluate her candidacy for TIF if she wanted to consider that.  Otherwise I reviewed her endoscopic history.  She incidentally had multiple small gastric ulcers on EGD without clear cause.  She did have biopsies that were positive for gastric intestinal metaplasia.  No family history of gastric cancer.  We discussed these findings and if we wanted to do a surveillance exam to ensure healing of gastric ulcers and to do mapping for gastric intestinal metaplasia.  I discussed this with her and she does want to proceed with an EGD.  Recall she does have a history of difficult intubation with anesthesia and her case will be done at the hospital for anesthesia support.  All questions answered she agrees with the plan.  Of note due for colonoscopy for screening purposes in 2028.  PLAN: - schedule EGD at [facility] in light of difficult intubation - confirm healing of gastric ulcers, mapping for GIM - counseled on long term risks of chronic PPI - trial of reducing her protonix  from 40mg  to 20mg  - consideration for  TIF pending her course - counseled on GIM  Christi Coward, MD [facility]astroenter[facility]

## 2024-01-25 ENCOUNTER — Encounter (HOSPITAL_COMMUNITY): Payer: Self-pay | Admitting: Gastroenterology

## 2024-01-26 ENCOUNTER — Encounter (HOSPITAL_COMMUNITY): Payer: Self-pay | Admitting: Gastroenterology

## 2024-01-26 ENCOUNTER — Ambulatory Visit (HOSPITAL_COMMUNITY)
Admission: RE | Admit: 2024-01-26 | Discharge: 2024-01-26 | Disposition: A | Discharge status: Home / Self Care | Attending: Gastroenterology | Admitting: Gastroenterology

## 2024-01-26 ENCOUNTER — Ambulatory Visit (HOSPITAL_BASED_OUTPATIENT_CLINIC_OR_DEPARTMENT_OTHER): Admitting: Anesthesiology

## 2024-01-26 ENCOUNTER — Encounter (HOSPITAL_COMMUNITY)
Admission: RE | Disposition: A | Discharge status: Home / Self Care | Payer: Self-pay | Source: Home / Self Care | Attending: Gastroenterology

## 2024-01-26 ENCOUNTER — Ambulatory Visit (HOSPITAL_COMMUNITY): Admitting: Anesthesiology

## 2024-01-26 ENCOUNTER — Other Ambulatory Visit: Payer: Self-pay

## 2024-01-26 DIAGNOSIS — T478X5A Adverse effect of other agents primarily affecting gastrointestinal system, initial encounter: Secondary | ICD-10-CM

## 2024-01-26 DIAGNOSIS — Z87891 Personal history of nicotine dependence: Secondary | ICD-10-CM | POA: Insufficient documentation

## 2024-01-26 DIAGNOSIS — M810 Age-related osteoporosis without current pathological fracture: Secondary | ICD-10-CM | POA: Diagnosis not present

## 2024-01-26 DIAGNOSIS — K219 Gastro-esophageal reflux disease without esophagitis: Secondary | ICD-10-CM

## 2024-01-26 DIAGNOSIS — K317 Polyp of stomach and duodenum: Secondary | ICD-10-CM | POA: Diagnosis not present

## 2024-01-26 DIAGNOSIS — Z79899 Other long term (current) drug therapy: Secondary | ICD-10-CM | POA: Diagnosis not present

## 2024-01-26 DIAGNOSIS — Z981 Arthrodesis status: Secondary | ICD-10-CM | POA: Insufficient documentation

## 2024-01-26 DIAGNOSIS — K31A19 Gastric intestinal metaplasia without dysplasia, unspecified site: Secondary | ICD-10-CM

## 2024-01-26 DIAGNOSIS — F419 Anxiety disorder, unspecified: Secondary | ICD-10-CM | POA: Diagnosis not present

## 2024-01-26 DIAGNOSIS — K259 Gastric ulcer, unspecified as acute or chronic, without hemorrhage or perforation: Secondary | ICD-10-CM | POA: Diagnosis not present

## 2024-01-26 DIAGNOSIS — K31A0 Gastric intestinal metaplasia, unspecified: Secondary | ICD-10-CM

## 2024-01-26 DIAGNOSIS — K2950 Unspecified chronic gastritis without bleeding: Secondary | ICD-10-CM | POA: Insufficient documentation

## 2024-01-26 DIAGNOSIS — Z8711 Personal history of peptic ulcer disease: Secondary | ICD-10-CM | POA: Diagnosis not present

## 2024-01-26 HISTORY — DX: Failed or difficult intubation, initial encounter: T88.4XXA

## 2024-01-26 HISTORY — PX: ESOPHAGOGASTRODUODENOSCOPY: SHX5428

## 2024-01-26 SURGERY — EGD (ESOPHAGOGASTRODUODENOSCOPY)
Anesthesia: Monitor Anesthesia Care

## 2024-01-26 MED ORDER — SODIUM CHLORIDE 0.9% FLUSH: 3.0000 mL | INTRAVENOUS | Status: DC | PRN

## 2024-01-26 MED ORDER — LIDOCAINE 2% (20 MG/ML) 5 ML SYRINGE
INTRAMUSCULAR | Status: DC | PRN
  Administered 2024-01-26: 40 mg via INTRAVENOUS

## 2024-01-26 MED ORDER — SODIUM CHLORIDE 0.9 % IV SOLN
INTRAVENOUS | Status: AC | PRN
  Administered 2024-01-26: 500 mL via INTRAMUSCULAR

## 2024-01-26 MED ORDER — PROPOFOL 500 MG/50ML IV EMUL
INTRAVENOUS | Status: DC | PRN
Start: 2024-01-26 — End: 2024-01-26
  Administered 2024-01-26: 150 ug/kg/min via INTRAVENOUS

## 2024-01-26 MED ORDER — SODIUM CHLORIDE 0.9% FLUSH: 3.0000 mL | Freq: Two times a day (BID) | INTRAVENOUS | Status: DC

## 2024-01-26 MED ORDER — PROPOFOL 10 MG/ML IV BOLUS
INTRAVENOUS | Status: DC | PRN
  Administered 2024-01-26: 30 mg via INTRAVENOUS
  Administered 2024-01-26: 20 mg via INTRAVENOUS

## 2024-01-26 MED ORDER — PROPOFOL 500 MG/50ML IV EMUL
INTRAVENOUS | Status: AC
Start: 2024-01-26 — End: ?
  Filled 2024-01-26: qty 50

## 2024-01-26 NOTE — Anesthesia Postprocedure Evaluation (Signed)
 Anesthesia Post Note  Patient: [name]  Procedure(s) Performed: EGD (ESOPHAGOGASTRODUODENOSCOPY)     Patient location during evaluation: PACU Anesthesia Type: MAC Level of consciousness: awake and alert Pain management: pain level controlled Vital Signs Assessment: post-procedure vital signs reviewed and stable Respiratory status: spontaneous breathing, nonlabored ventilation, respiratory function stable and patient connected to nasal cannula oxygen Cardiovascular status: stable and blood pressure returned to baseline Postop Assessment: no apparent nausea or vomiting Anesthetic complications: no   No notable events documented.  Last Vitals:  Vitals:   01/26/24 1130 01/26/24 1140  BP: (!) 133/91 (!) 130/92  Pulse: 62 62  Resp: 13 19  Temp:    SpO2: 100% 100%    Last Pain:  Vitals:   01/26/24 1140  TempSrc:   PainSc: 0-No pain                 Lethaniel Rave

## 2024-01-26 NOTE — Op Note (Signed)
 [facility] Patient Name: [name] Procedure Date: 01/26/2024 MRN: [id number] Attending MD: Lendon Queen. General Kenner , MD, [id number] Date of Birth: 1951/04/01 CSN: [id number] Age: 73 Admit Type: Outpatient Procedure:                Upper GI endoscopy Indications:              history of gastro-esophageal reflux disease - on                            protonix , history of gastric ulcers - ensure                            mucosal healing, Gastric intestinal metaplasia                            without dysplasia - gastric mapping Providers:                Lendon Queen. General Kenner, MD, Nikki Barters RN, RN, Judith Novak, Technician Referring MD:              Medicines:                Monitored Anesthesia Care Complications:            No immediate complications. Estimated blood loss:                            Minimal. Estimated Blood Loss:     Estimated blood loss was minimal. Procedure:                Pre-Anesthesia Assessment:                           - Prior to the procedure, a History and Physical                            was performed, and patient medications and                            allergies were reviewed. The patient's tolerance of                            previous anesthesia was also reviewed. The risks                            and benefits of the procedure and the sedation                            options and risks were discussed with the patient.                            All questions were answered, and informed consent  was obtained. Prior Anticoagulants: The patient has                            taken no anticoagulant or antiplatelet agents. ASA                            Grade Assessment: II - A patient with mild systemic                            disease. After reviewing the risks and benefits,                            the patient was deemed in satisfactory condition to                             undergo the procedure.                           After obtaining informed consent, the endoscope was                            passed under direct vision. Throughout the                            procedure, the patient's blood pressure, pulse, and                            oxygen saturations were monitored continuously. The                            [id number]-H190 ([id number] Olympus endoscope was introduced                            through the mouth, and advanced to the second part                            of duodenum. The upper GI endoscopy was                            accomplished without difficulty. The patient                            tolerated the procedure well. Scope In: Scope Out: Findings:      Esophagogastric landmarks were identified: the Z-line was found at 39       cm, the gastroesophageal junction was found at 39 cm and the upper       extent of the gastric folds was found at 39 cm from the incisors.      The gastroesophageal flap valve was visualized endoscopically and       classified as Hill Grade I (prominent fold, tight to endoscope).      The exam of the esophagus was otherwise normal. No erosive changes.      Biopsies were obtained from the proximal and distal esophagus with cold  forceps to ensure no eosinophilic esophagitis and assess for reflux       changes (in case TIF is pursued at some point).      A few small sessile polyps were found in the gastric fundus. Likely       fundic gland polyps. Representative biopsies were taken with a cold       forceps for histology.      The exam of the stomach was otherwise normal.      Biopsies were taken with a cold forceps in the gastric fundus, in the       gastric body, at the incisura and in the gastric antrum for histology -       gastric mapping, assess for GIM / dysplasia      The examined duodenum was normal. Impression:               - Esophagogastric landmarks identified.                            - Gastroesophageal flap valve classified as Hill                            Grade I (prominent fold, tight to endoscope).                           - Normal esophagus otherwise - biopsies to assess                            for reflux changes, ensure no EoE if TIF is pursued                            in the future.                           - A few gastric polyps. Likely benign fundic gland                            polyps. Biopsied.                           - Normal stomach otherwise - interval healing of                            ulcers                           - Gastric mapping performed to further evaluate GIM.                           - Normal examined duodenum.                           Patient is a candidate for TIF endoscopically if                            she wishes to get off PPI at some point in the  future. Moderate Sedation:      No moderate sedation, case performed with MAC Recommendation:           - Patient has a contact number available for                            emergencies. The signs and symptoms of potential                            delayed complications were discussed with the                            patient. Return to normal activities tomorrow.                            Written discharge instructions were provided to the                            patient.                           - Resume previous diet.                           - Continue present medications.                           - Await pathology results. Procedure Code(s):        --- Professional ---                           [phone], Esophagogastroduodenoscopy, flexible,                            transoral; with biopsy, single or multiple Diagnosis Code(s):        --- Professional ---                           K31.7, Polyp of stomach and duodenum                           K21.9, Gastro-esophageal reflux disease without                             esophagitis                           K25.9, Gastric ulcer, unspecified as acute or                            chronic, without hemorrhage or perforation                           K31.A19, Gastric intestinal metaplasia without                            dysplasia, unspecified site CPT copyright 2022 American Medical Association. All rights reserved. The  codes documented in this report are preliminary and upon coder review may  be revised to meet current compliance requirements. Landon Pinion P. Ronnesha Mester, MD 01/26/2024 11:35:38 AM This report has been signed electronically. Number of Addenda: 0

## 2024-01-26 NOTE — Transfer of Care (Signed)
 Immediate Anesthesia Transfer of Care Note  Patient: [name]  Procedure(s) Performed: EGD (ESOPHAGOGASTRODUODENOSCOPY)  Patient Location: PACU  Anesthesia Type:MAC  Level of Consciousness: sedated  Airway & Oxygen Therapy: Patient Spontanous Breathing and Patient connected to face mask oxygen  Post-op Assessment: Report given to RN and Post -op Vital signs reviewed and stable  Post vital signs: Reviewed and stable  Last Vitals:  Vitals Value Taken Time  BP 117/67 01/26/24 1118  Temp    Pulse 80 01/26/24 1120  Resp 13 01/26/24 1120  SpO2 100 % 01/26/24 1120    Last Pain:  Vitals:   01/26/24 1118  TempSrc: Temporal  PainSc: 0-No pain         Complications: No notable events documented.

## 2024-01-26 NOTE — Interval H&P Note (Signed)
 History and Physical Interval Note: Here for EGD At the hospital due to difficult airway for intubation - evaluating history of GERD, gastric ulcers, gastric intestinal metaplasia. No interval changes since the office visit. Discussed risks / benefits she wishes to proceed. All questions answered.   01/26/2024 10:41 AM  [name]  has presented today for surgery, with the diagnosis of GERD, hx of gastric ulcers, difficult intubation.  The various methods of treatment have been discussed with the patient and family. After consideration of risks, benefits and other options for treatment, the patient has consented to  Procedure(s): EGD (ESOPHAGOGASTRODUODENOSCOPY) (N/A) as a surgical intervention.  The patient's history has been reviewed, patient examined, no change in status, stable for surgery.  I have reviewed the patient's chart and labs.  Questions were answered to the patient's satisfaction.     Landon Pinion P Jazyah Butsch

## 2024-01-26 NOTE — Anesthesia Procedure Notes (Signed)
 Procedure Name: MAC Date/Time: 01/26/2024 10:54 AM  Performed by: Micky Albee, CRNAOxygen Delivery Method: Nasal cannula

## 2024-01-26 NOTE — Anesthesia Preprocedure Evaluation (Addendum)
 Anesthesia Evaluation  Patient identified by MRN, date of birth, ID band Patient awake    Reviewed: Allergy & Precautions, NPO status , Patient's Chart, lab work & pertinent test results  History of Anesthesia Complications (+) DIFFICULT AIRWAY and history of anesthetic complications (Hx of difficult intubation at [facility] with multiple anesthesiologist attempts with various instruments including video laryngoscopes and fiberoptic scope, able to place ETT with [facility] D-blade per EMR)  Airway Mallampati: II  TM Distance: >3 FB Neck ROM: Full    Dental no notable dental hx.    Pulmonary former smoker   Pulmonary exam normal        Cardiovascular negative cardio ROS Normal cardiovascular exam     Neuro/Psych  PSYCHIATRIC DISORDERS Anxiety     S/P ACDF    GI/Hepatic Neg liver ROS, PUD,GERD  Medicated and Controlled,,GERD, hx of gastric ulcers   Endo/Other  negative endocrine ROS    Renal/GU negative Renal ROS  negative genitourinary   Musculoskeletal negative musculoskeletal ROS (+)    Abdominal   Peds  Hematology negative hematology ROS (+)   Anesthesia Other Findings   Reproductive/Obstetrics negative OB ROS                             Anesthesia Physical Anesthesia Plan  ASA: 2  Anesthesia Plan: MAC   Post-op Pain Management: Minimal or no pain anticipated   Induction:   PONV Risk Score and Plan: 2 and Treatment may vary due to age or medical condition and Propofol  infusion  Airway Management Planned: Natural Airway and Nasal Cannula  Additional Equipment: None  Intra-op Plan:   Post-operative Plan:   Informed Consent: I have reviewed the patients History and Physical, chart, labs and discussed the procedure including the risks, benefits and alternatives for the proposed anesthesia with the patient or authorized representative who has indicated his/her understanding and acceptance.        Plan Discussed with: CRNA  Anesthesia Plan Comments: (Difficult intubation in 2018:    "Difficult intubation** Patient has a history of ACDF and no intubation since that time. Induced and patient was easily maskable. Initial attempt with MAC3 by SRNA was only able to visualize epiglottis. During same attempt attending MD tried to look and was only able to obtain a grade 4 view with the [facility]. Epiglottis would not lift. ETT was used to try to lift the epiglottis but unable to and advance. Epiglottis was too far back to try with bougie. Came out and was able to ventilate with an oral airway. The [facility] was already in the room and next attempt by attending MD again could only visualize a grade 4 view. Again came out and patient was easily maskable. Given the patients mouth size and glottic opening, the pediatric d-blade would have been preferred next step but was unavailable. Oral fiberoptic was attempted but unable to visualize glottic opening, no attempt with ETT. Patient remained maskable and help was called for. A 6.5ETT was lubed and attempted to place into the nose but was unable to be advanced, and was only attempted gently, but did cause some mild bleeding. A fasttrac LMA was placed and patient was able to be ventilated. The included ETT was placed and the fiberoptic was attempted to gain a view but unable to see glottic opening. Additional help arrived and patient was ventilated through the LMA. A new anesthesiology attending attempted a fiberoptic intubated through the fast trac but was unable  to visualize. Patient remained able to ventilate through the LMA easily. Decision made to try one more attempt with the D-blade, which was then successful and showed a grade 2 view.")        Anesthesia Quick Evaluation

## 2024-01-26 NOTE — Discharge Instructions (Signed)

## 2024-01-27 ENCOUNTER — Encounter (HOSPITAL_COMMUNITY): Payer: Self-pay | Admitting: Gastroenterology

## 2024-01-27 LAB — SURGICAL PATHOLOGY

## 2024-01-28 ENCOUNTER — Encounter: Payer: Self-pay | Admitting: Gastroenterology

## 2024-02-04 ENCOUNTER — Encounter: Payer: Self-pay | Admitting: Physician Assistant

## 2024-02-04 ENCOUNTER — Ambulatory Visit: Admitting: Physician Assistant

## 2024-02-04 VITALS — BP 110/78 | HR 79 | Temp 98.2°F | Ht 63.0 in | Wt 125.4 lb

## 2024-02-04 DIAGNOSIS — H9311 Tinnitus, right ear: Secondary | ICD-10-CM | POA: Diagnosis not present

## 2024-02-04 DIAGNOSIS — H6991 Unspecified Eustachian tube disorder, right ear: Secondary | ICD-10-CM

## 2024-02-04 NOTE — Progress Notes (Signed)
 Patient ID: [name], female    DOB: March 29, 1951, 73 y.o.   MRN: [id number]   Assessment & Plan:  Right-sided tinnitus  Acute dysfunction of Eustachian tube, right      Assessment and Plan Assessment & Plan Eustachian tube dysfunction Eustachian tube dysfunction in the right ear, contributing to tinnitus. No infection or acute inflammation. Earwax removal revealed eustachian tube dysfunction. - Initiate Flonase  for 2-4 weeks. - Advise use of an antihistamine such as Claritin. - Encourage adequate hydration. - Recommend Sudafed for 3 days. - Provide ear care instructions for upcoming flight to prevent barotrauma.  Tinnitus Tinnitus with constant ringing or white noise in the right ear for several weeks. No dizziness or headache. Likely related to eustachian tube dysfunction.   Ceruminosis is noted R ear canal. Removal procedure explained and verbal consent obtained from patient. Wax is removed by lighted currette manual debridement from R ear canal. Pt tolerated procedure well. Instructions for home care to prevent wax buildup are given.    No follow-ups on file.    Subjective:    Chief Complaint  Patient presents with   Tinnitus    Pt in office for constant ringing in R ear; sounds like a constant white noise; pt admits been this way for a few weeks;     HPI Discussed the use of AI scribe software for clinical note transcription with the patient, who gave verbal consent to proceed.  History of Present Illness [name] is a 73 year old female who presents with constant ringing in her ears.  She describes the ringing as a 'white noise sound machine' with a 'tingy' quality, which began after feeling like her ear was clogged. Initially, she attributed it to allergies and used Debrox for several days, which she felt might have helped slightly, but the ringing seemed to get louder last night. She has no history of similar ringing or white noise issues  unless there was something wrong with her ear. She meditates in the mornings using earbuds and noticed that the sound in one earbud sounded 'funny' today, which is the first time she has experienced this. No pain, dizziness, or headaches.  She had a brain MRI in September due to headaches, which were thought to be allergy-related and have since resolved. She has a history of vertigo but is not experiencing it currently.  She has started walking outside more frequently due to nicer weather and notes that she can feel when affected by allergies, as the roots of her teeth go up into her sinuses. She also clenches her teeth at night.     Past Medical History:  Diagnosis Date   Anxiety    When I fly   Difficult intubation    Gastric ulcer    GERD (gastroesophageal reflux disease)    Hyperlipidemia    Osteoporosis    osteoporosis    Past Surgical History:  Procedure Laterality Date   ABDOMINAL HYSTERECTOMY     APPENDECTOMY  2006   BALLOON DILATION N/A 11/25/2021   Procedure: BALLOON DILATION;  Surgeon: Lindle Rhea, MD;  Location: [facility];  Service: Gastroenterology;  Laterality: N/A;   BIOPSY  11/25/2021   Procedure: BIOPSY;  Surgeon: Lindle Rhea, MD;  Location: [facility];  Service: Gastroenterology;;   disc ectomy     ESOPHAGOGASTRODUODENOSCOPY N/A 01/26/2024   Procedure: EGD (ESOPHAGOGASTRODUODENOSCOPY);  Surgeon: Ace Holder, MD;  Location: [facility];  Service: Gastroenterology;  Laterality: N/A;   ESOPHAGOGASTRODUODENOSCOPY (EGD) WITH  PROPOFOL  N/A 11/25/2021   Procedure: ESOPHAGOGASTRODUODENOSCOPY (EGD) WITH PROPOFOL ;  Surgeon: Lindle Rhea, MD;  Location: [facility];  Service: Gastroenterology;  Laterality: N/A;   pheumothorax      PLEURAL SCARIFICATION     TONSILECTOMY/ADENOIDECTOMY WITH MYRINGOTOMY      Family History  Problem Relation Age of Onset   Heart disease Mother    Cancer Mother        breast cancer   Breast cancer Mother     Cancer Father        myo dysplasia   Hyperlipidemia Brother    Hypertension Brother    Mental illness Brother    Other Brother        pre-diabetes   Esophageal cancer Maternal Aunt    Cancer Maternal Grandmother        cervical or uterine   Cancer Paternal Grandmother        ovarian   Cancer Paternal Grandfather        prostate   Colon cancer Neg Hx    Pancreatic cancer Neg Hx    Stomach cancer Neg Hx     Social History   Tobacco Use   Smoking status: Former    Current packs/day: 0.00    Average packs/day: 1.5 packs/day for 28.0 years (42.0 ttl pk-yrs)    Types: Cigarettes    Start date: 09/23/1967    Quit date: 09/23/1995    Years since quitting: 28.3   Smokeless tobacco: Never  Vaping Use   Vaping status: Never Used  Substance Use Topics   Alcohol use: No   Drug use: No     No Known Allergies  Review of Systems NEGATIVE UNLESS OTHERWISE INDICATED IN HPI      Objective:     BP 110/78 (BP Location: Left Arm, Patient Position: Sitting, Cuff Size: Normal)   Pulse 79   Temp 98.2 F (36.8 C) (Temporal)   Ht 5\' 3"  (1.6 m)   Wt 125 lb 6.4 oz (56.9 kg)   SpO2 97%   BMI 22.21 kg/m   Wt Readings from Last 3 Encounters:  02/04/24 125 lb 6.4 oz (56.9 kg)  01/26/24 121 lb (54.9 kg)  01/15/24 125 lb 2 oz (56.8 kg)    BP Readings from Last 3 Encounters:  02/04/24 110/78  01/26/24 (!) 130/92  01/15/24 114/64     Physical Exam Vitals and nursing note reviewed.  Constitutional:      General: She is not in acute distress.    Appearance: Normal appearance. She is not ill-appearing.  HENT:     Head: Normocephalic.     Right Ear: Ear canal and external ear normal. A middle ear effusion is present. There is impacted cerumen. Tympanic membrane is not erythematous.     Left Ear: Tympanic membrane, ear canal and external ear normal.     Nose: No congestion.     Mouth/Throat:     Mouth: Mucous membranes are moist.     Pharynx: No oropharyngeal exudate or posterior  oropharyngeal erythema.  Eyes:     Extraocular Movements: Extraocular movements intact.     Conjunctiva/sclera: Conjunctivae normal.     Pupils: Pupils are equal, round, and reactive to light.  Cardiovascular:     Rate and Rhythm: Normal rate and regular rhythm.     Pulses: Normal pulses.     Heart sounds: Normal heart sounds. No murmur heard. Pulmonary:     Effort: Pulmonary effort is normal. No respiratory distress.  Breath sounds: Normal breath sounds. No wheezing.  Musculoskeletal:     Cervical back: Normal range of motion.  Skin:    General: Skin is warm.  Neurological:     Mental Status: She is alert and oriented to person, place, and time.  Psychiatric:        Mood and Affect: Mood normal.        Behavior: Behavior normal.             Kahmari Herard M Roran Wegner, PA-C

## 2024-02-04 NOTE — Patient Instructions (Addendum)
 Start using Flonase  for the next 2-4 weeks Add antihistamine such as Claritin 10 mg daily Keep well hydrated Use Sudafed x 3 days   When you are flying, use a topical nasal decongestant, such as Afrin, on the night before flying, the morning of departure, and the evening of arrival.  Also recommend one-time 60 mg dose of Sudafed at the time of departure.  May also try to actively clear ear pressure by swallowing, yawning, chewing gum. Do not use Afrin for more than 3 days.

## 2024-02-18 DIAGNOSIS — M25551 Pain in right hip: Secondary | ICD-10-CM | POA: Diagnosis not present

## 2024-02-18 DIAGNOSIS — M545 Low back pain, unspecified: Secondary | ICD-10-CM | POA: Diagnosis not present

## 2024-03-13 ENCOUNTER — Other Ambulatory Visit: Payer: Self-pay | Admitting: Obstetrics and Gynecology

## 2024-03-28 ENCOUNTER — Encounter: Payer: Self-pay | Admitting: Gastroenterology

## 2024-04-20 ENCOUNTER — Other Ambulatory Visit: Payer: Self-pay | Admitting: Physician Assistant

## 2024-04-20 ENCOUNTER — Encounter: Payer: Self-pay | Admitting: Physician Assistant

## 2024-04-20 DIAGNOSIS — M810 Age-related osteoporosis without current pathological fracture: Secondary | ICD-10-CM

## 2024-04-20 DIAGNOSIS — Z1231 Encounter for screening mammogram for malignant neoplasm of breast: Secondary | ICD-10-CM

## 2024-04-29 ENCOUNTER — Ambulatory Visit (INDEPENDENT_AMBULATORY_CARE_PROVIDER_SITE_OTHER)
Admission: RE | Admit: 2024-04-29 | Discharge: 2024-04-29 | Disposition: A | Discharge status: Home / Self Care | Source: Ambulatory Visit | Attending: Physician Assistant | Admitting: Physician Assistant

## 2024-04-29 DIAGNOSIS — M810 Age-related osteoporosis without current pathological fracture: Secondary | ICD-10-CM

## 2024-05-01 ENCOUNTER — Ambulatory Visit: Payer: Self-pay | Admitting: Physician Assistant

## 2024-05-16 ENCOUNTER — Encounter: Payer: Self-pay | Admitting: Physician Assistant

## 2024-05-16 ENCOUNTER — Ambulatory Visit (INDEPENDENT_AMBULATORY_CARE_PROVIDER_SITE_OTHER): Admitting: Physician Assistant

## 2024-05-16 VITALS — BP 110/70 | HR 67 | Temp 97.3°F | Ht 63.0 in | Wt 122.2 lb

## 2024-05-16 DIAGNOSIS — K219 Gastro-esophageal reflux disease without esophagitis: Secondary | ICD-10-CM

## 2024-05-16 DIAGNOSIS — Z0000 Encounter for general adult medical examination without abnormal findings: Secondary | ICD-10-CM | POA: Diagnosis not present

## 2024-05-16 DIAGNOSIS — E559 Vitamin D deficiency, unspecified: Secondary | ICD-10-CM

## 2024-05-16 DIAGNOSIS — E785 Hyperlipidemia, unspecified: Secondary | ICD-10-CM

## 2024-05-16 DIAGNOSIS — E538 Deficiency of other specified B group vitamins: Secondary | ICD-10-CM

## 2024-05-16 DIAGNOSIS — M810 Age-related osteoporosis without current pathological fracture: Secondary | ICD-10-CM

## 2024-05-16 DIAGNOSIS — E88819 Insulin resistance, unspecified: Secondary | ICD-10-CM

## 2024-05-16 LAB — CBC WITH DIFFERENTIAL/PLATELET
Basophils Absolute: 0 K/uL (ref 0.0–0.1)
Basophils Relative: 0.6 % (ref 0.0–3.0)
Eosinophils Absolute: 0.1 K/uL (ref 0.0–0.7)
Eosinophils Relative: 1.9 % (ref 0.0–5.0)
HCT: 39.3 % (ref 36.0–46.0)
Hemoglobin: 13.1 g/dL (ref 12.0–15.0)
Lymphocytes Relative: 32.9 % (ref 12.0–46.0)
Lymphs Abs: 2.2 K/uL (ref 0.7–4.0)
MCHC: 33.3 g/dL (ref 30.0–36.0)
MCV: 84.1 fl (ref 78.0–100.0)
Monocytes Absolute: 0.6 K/uL (ref 0.1–1.0)
Monocytes Relative: 9.4 % (ref 3.0–12.0)
Neutro Abs: 3.6 K/uL (ref 1.4–7.7)
Neutrophils Relative %: 55.2 % (ref 43.0–77.0)
Platelets: 345 K/uL (ref 150.0–400.0)
RBC: 4.67 Mil/uL (ref 3.87–5.11)
RDW: 13.9 % (ref 11.5–15.5)
WBC: 6.6 K/uL (ref 4.0–10.5)

## 2024-05-16 LAB — LIPID PANEL
Cholesterol: 187 mg/dL (ref 0–200)
HDL: 65 mg/dL (ref >39.00)
LDL Cholesterol: 105 mg/dL — ABNORMAL HIGH (ref 0–99)
NonHDL: 121.8
Total CHOL/HDL Ratio: 3
Triglycerides: 85 mg/dL (ref 0.0–149.0)
VLDL: 17 mg/dL (ref 0.0–40.0)

## 2024-05-16 LAB — COMPREHENSIVE METABOLIC PANEL WITH GFR
ALT: 16 U/L (ref 0–35)
AST: 21 U/L (ref 0–37)
Albumin: 4.4 g/dL (ref 3.5–5.2)
Alkaline Phosphatase: 54 U/L (ref 39–117)
BUN: 14 mg/dL (ref 6–23)
CO2: 30 meq/L (ref 19–32)
Calcium: 9.8 mg/dL (ref 8.4–10.5)
Chloride: 101 meq/L (ref 96–112)
Creatinine, Ser: 0.72 mg/dL (ref 0.40–1.20)
GFR: 84.28 mL/min (ref >60.00)
Glucose, Bld: 92 mg/dL (ref 70–99)
Potassium: 4.3 meq/L (ref 3.5–5.1)
Sodium: 139 meq/L (ref 135–145)
Total Bilirubin: 0.5 mg/dL (ref 0.2–1.2)
Total Protein: 7.7 g/dL (ref 6.0–8.3)

## 2024-05-16 LAB — TSH: TSH: 1.93 u[IU]/mL (ref 0.35–5.50)

## 2024-05-16 LAB — VITAMIN B12: Vitamin B-12: 680 pg/mL (ref 211–911)

## 2024-05-16 LAB — HEMOGLOBIN A1C: Hgb A1c MFr Bld: 6 % (ref 4.6–6.5)

## 2024-05-16 LAB — VITAMIN D 25 HYDROXY (VIT D DEFICIENCY, FRACTURES): VITD: 52.34 ng/mL (ref 30.00–100.00)

## 2024-05-16 NOTE — Progress Notes (Signed)
 Subjective:    [name] is a 73 y.o. female and is here for a comprehensive physical exam.  Abdominal Pain Pertinent negatives include no constipation, diarrhea, dysuria, fever, frequency, headaches, myalgias, nausea, vomiting or weight loss.    Health Maintenance Due  Topic Date Due   Medicare Annual Wellness (AWV)  07/12/2024    Discussed the use of AI scribe software for clinical note transcription with the patient, who gave verbal consent to proceed.  History of Present Illness [name] is a 73 year old female with osteoporosis who presents with concerns about medication side effects and gastrointestinal symptoms.  She is taking Boniva  for osteoporosis and experiences burning sensations in her stomach. Protonix  40 mg twice daily initially improved symptoms, but reducing to 20 mg daily led to recurrence. Alternating between 40 mg and 20 mg daily mostly controls symptoms, with occasional mild burning in the evenings.  An endoscopy prior to Boniva  revealed small stomach ulcers and intestinal cells in the stomach. A follow-up endoscopy showed healed ulcers and no intestinal cells, with biopsies performed.  She maintains a regular exercise routine, walking 2.5 to 3 miles daily and attending [employer] classes twice a week. Her diet includes a daily spinach salad and balanced meals prepared by her husband.    Health Maintenance: Immunizations -- planning to get flu shot and COVID shot this fall Colonoscopy -- UpToDate; due Dec 2028 Mammogram -- scheduled mammogram for later this week PAP -- n/a Bone Density -- UpToDate  Diet -- variable and overall low process Exercise -- regular exercise  Sleep habits -- overall doing well Mood -- no major concerns  UTD with dentist? - yes UTD with eye doctor? - yes  Weight history: Wt Readings from Last 10 Encounters:  05/16/24 122 lb 4 oz (55.5 kg)  02/04/24 125 lb 6.4 oz (56.9 kg)  01/26/24 121 lb (54.9 kg)   01/15/24 125 lb 2 oz (56.8 kg)  09/24/23 125 lb (56.7 kg)  07/13/23 125 lb (56.7 kg)  04/29/23 125 lb (56.7 kg)  03/06/23 125 lb (56.7 kg)  01/09/23 125 lb 6.1 oz (56.9 kg)  04/25/22 125 lb (56.7 kg)   Body mass index is 21.66 kg/m. No LMP recorded. Patient is postmenopausal.  Alcohol use:  reports no history of alcohol use.  Tobacco use:  Tobacco Use: Medium Risk (05/16/2024)   Patient History    Smoking Tobacco Use: Former    Smokeless Tobacco Use: Never    Passive Exposure: Not on file   Eligible for lung cancer screening? no     05/16/2024    8:47 AM  Depression screen PHQ 2/9  Decreased Interest 0  Down, Depressed, Hopeless 0  PHQ - 2 Score 0     Other providers/specialists: Patient Care Team: Job Lukes, [place] as PCP - General (Physician Assistant) Lilton Legions, DO as Consulting Physician (Obstetrics and Gynecology) Cary Doffing, MD as Consulting Physician (Dermatology)    PMHx, SurgHx, SocialHx, Medications, and Allergies were reviewed in the Visit Navigator and updated as appropriate.   Past Medical History:  Diagnosis Date   Anxiety    When I fly   Difficult intubation    Gastric ulcer    GERD (gastroesophageal reflux disease)    Hyperlipidemia    Osteoporosis    osteoporosis   Ulcer      Past Surgical History:  Procedure Laterality Date   ABDOMINAL HYSTERECTOMY     APPENDECTOMY  2006   BALLOON DILATION N/A 11/25/2021  Procedure: BALLOON DILATION;  Surgeon: Eda Iha, MD;  Location: [facility];  Service: Gastroenterology;  Laterality: N/A;   BIOPSY  11/25/2021   Procedure: BIOPSY;  Surgeon: Eda Iha, MD;  Location: [facility];  Service: Gastroenterology;;   disc ectomy     ESOPHAGOGASTRODUODENOSCOPY N/A 01/26/2024   Procedure: EGD (ESOPHAGOGASTRODUODENOSCOPY);  Surgeon: Leigh Elspeth SQUIBB, MD;  Location: [facility];  Service: Gastroenterology;  Laterality: N/A;   ESOPHAGOGASTRODUODENOSCOPY (EGD) WITH PROPOFOL   N/A 11/25/2021   Procedure: ESOPHAGOGASTRODUODENOSCOPY (EGD) WITH PROPOFOL ;  Surgeon: Eda Iha, MD;  Location: [facility];  Service: Gastroenterology;  Laterality: N/A;   pheumothorax      PLEURAL SCARIFICATION     TONSILECTOMY/ADENOIDECTOMY WITH MYRINGOTOMY       Family History  Problem Relation Age of Onset   Heart disease Mother    Cancer Mother        breast cancer   Breast cancer Mother    Cancer Father        myo dysplasia   Hyperlipidemia Brother    Hypertension Brother    Mental illness Brother    Other Brother        pre-diabetes   Esophageal cancer Maternal Aunt    Cancer Maternal Grandmother        cervical or uterine   Cancer Paternal Grandmother        ovarian   Cancer Paternal Grandfather        prostate   Colon cancer Neg Hx    Pancreatic cancer Neg Hx    Stomach cancer Neg Hx     Social History   Tobacco Use   Smoking status: Former    Current packs/day: 0.00    Average packs/day: 1.5 packs/day for 28.0 years (42.0 ttl pk-yrs)    Types: Cigarettes    Start date: 09/23/1967    Quit date: 09/23/1995    Years since quitting: 28.6   Smokeless tobacco: Never  Vaping Use   Vaping status: Never Used  Substance Use Topics   Alcohol use: No   Drug use: No    Review of Systems:   Review of Systems  Constitutional:  Negative for chills, fever, malaise/fatigue and weight loss.  HENT:  Negative for hearing loss, sinus pain and sore throat.   Respiratory:  Negative for cough and hemoptysis.   Cardiovascular:  Negative for chest pain, palpitations, leg swelling and PND.  Gastrointestinal:  Positive for abdominal pain. Negative for constipation, diarrhea, heartburn, nausea and vomiting.  Genitourinary:  Negative for dysuria, frequency and urgency.  Musculoskeletal:  Negative for back pain, myalgias and neck pain.  Skin:  Negative for itching and rash.  Neurological:  Negative for dizziness, tingling, seizures and headaches.  Endo/Heme/Allergies:   Negative for polydipsia.  Psychiatric/Behavioral:  Negative for depression. The patient is not nervous/anxious.     Objective:   BP 110/70 (BP Location: Left Arm, Patient Position: Sitting, Cuff Size: Normal)   Pulse 67   Temp (!) 97.3 F (36.3 C) (Temporal)   Ht 5' 3 (1.6 m)   Wt 122 lb 4 oz (55.5 kg)   SpO2 97%   BMI 21.66 kg/m  Body mass index is 21.66 kg/m.   General Appearance:    Alert, cooperative, no distress, appears stated age  Head:    Normocephalic, without obvious abnormality, atraumatic  Eyes:    PERRL, conjunctiva/corneas clear, EOM's intact, fundi    benign, both eyes  Ears:    Normal TM's and external ear canals, both ears  Nose:  Nares normal, septum midline, mucosa normal, no drainage    or sinus tenderness  Throat:   Lips, mucosa, and tongue normal; teeth and gums normal  Neck:   Supple, symmetrical, trachea midline, no adenopathy;    thyroid :  no enlargement/tenderness/nodules; no carotid   bruit or JVD  Back:     Symmetric, no curvature, ROM normal, no CVA tenderness  Lungs:     Clear to auscultation bilaterally, respirations unlabored  Chest Wall:    No tenderness or deformity   Heart:    Regular rate and rhythm, S1 and S2 normal, no murmur, rub or gallop  Breast Exam:    Deferred  Abdomen:     Soft, non-tender, bowel sounds active all four quadrants,    no masses, no organomegaly  Genitalia:    Deferred  Extremities:   Extremities normal, atraumatic, no cyanosis or edema  Pulses:   2+ and symmetric all extremities  Skin:   Skin color, texture, turgor normal, no rashes or lesions  Lymph nodes:   Cervical, supraclavicular, and axillary nodes normal  Neurologic:   CNII-XII intact, normal strength, sensation and reflexes    throughout    Assessment/Plan:   Assessment and Plan Assessment & Plan General Health Maintenance Up to date with most vaccinations. Plans to get flu and COVID vaccines. Considering RSV vaccine due to husband's emphysema.  Regular screenings and check-ups maintained. - Obtain flu and COVID vaccines when available. - Consider RSV vaccine, especially if around young children. - Continue regular health screenings and check-ups.  Osteoporosis on bisphosphonate therapy with gastrointestinal side effects Osteoporosis managed with Ibandronate , but gastrointestinal side effects are a concern. Bone density improved. Discussed Prolia as an alternative with concerns about osteonecrosis of the jaw. - Continue alternating 40 mg and 20 mg Pantoprazole  to manage GERD symptoms. - Consider a holiday from Boniva  to assess gastrointestinal side effects. - Consult with gastroenterologist about switching to Prolia if Boniva  is not tolerated.  Gastroesophageal reflux disease GERD managed with Pantoprazole . Burning sensations indicate ongoing symptoms when reducing dosage. - Continue alternating Pantoprazole  40 mg and 20 mg to manage symptoms.  Insomnia Insomnia managed with Benadryl equivalent, contributing to memory issues and tinnitus. Melatonin considered as a safer alternative. - Switch from Benadryl to melatonin for sleep. - Consider prescription sleep aids if melatonin is ineffective.  Hyperlipidemia Hyperlipidemia managed with Rosuvastatin  5 mg daily, well-tolerated. - Continue Rosuvastatin  5 mg daily. - Monitor cholesterol levels.  Vitamin D  deficiency Vitamin D  deficiency managed with calcium  carbonate-vitamin D  supplements. - Check vitamin D  levels.  Deficiency of B group vitamins Plan to update B12 levels due to dietary habits and family history of thyroid  issues. - Check B12 levels.        Lucie Buttner, PA-C [facility]

## 2024-05-17 ENCOUNTER — Ambulatory Visit: Payer: Self-pay | Admitting: Physician Assistant

## 2024-05-18 MED ORDER — ROSUVASTATIN CALCIUM 10 MG PO TABS: 10.0000 mg | ORAL_TABLET | Freq: Every day | ORAL | 3 refills | Status: AC

## 2024-05-20 ENCOUNTER — Ambulatory Visit
Admission: RE | Admit: 2024-05-20 | Discharge: 2024-05-20 | Disposition: A | Discharge status: Home / Self Care | Source: Ambulatory Visit | Attending: Physician Assistant | Admitting: Physician Assistant

## 2024-05-20 DIAGNOSIS — Z1231 Encounter for screening mammogram for malignant neoplasm of breast: Secondary | ICD-10-CM

## 2024-05-25 ENCOUNTER — Encounter: Payer: Self-pay | Admitting: Physician Assistant

## 2024-05-25 DIAGNOSIS — Z23 Encounter for immunization: Secondary | ICD-10-CM

## 2024-05-25 MED ORDER — COVID-19 MRNA VACC (MODERNA) 50 MCG/0.5ML IM SUSP: 0.5000 mL | Freq: Once | INTRAMUSCULAR | 0 refills | Status: AC

## 2024-05-26 DIAGNOSIS — H2513 Age-related nuclear cataract, bilateral: Secondary | ICD-10-CM | POA: Diagnosis not present

## 2024-05-26 DIAGNOSIS — H52203 Unspecified astigmatism, bilateral: Secondary | ICD-10-CM | POA: Diagnosis not present

## 2024-05-31 ENCOUNTER — Encounter: Payer: Self-pay | Admitting: Physician Assistant

## 2024-06-28 ENCOUNTER — Encounter: Admitting: Physician Assistant

## 2024-07-04 ENCOUNTER — Encounter: Payer: Self-pay | Admitting: Gastroenterology

## 2024-07-05 NOTE — Progress Notes (Signed)
 "     Ellouise Console, PA-C [address] [place], [place]  [address] Phone: [phone]   Primary Care Physician: Job Lukes, [place]  Primary Gastroenterologist:  Ellouise Console, PA-C / Elspeth Naval, MD   Chief Complaint: Follow-up epigastric pain, GERD, gastritis, Hx Gastric Ulcer   HPI:   [name] is a 73 y.o. female returns for 18-month follow-up of GERD, osteoporosis, gastric ulcers (H. pylori negative), gastric intestinal metaplasia.  Long-term PPI use.  Discussed the use of AI scribe software for clinical note transcription with the patient, who gave verbal consent to proceed.  History of Present Illness [name] is a 73 year old female with stomach ulcers and acid reflux who presents with a persistent burning sensation in the upper abdomen.  She experiences a persistent burning sensation in the mid-upper abdomen, primarily in the evening after dinner. The sensation sometimes feels like gas or rumbling, and burping alleviates the discomfort. The burning does not disturb her sleep, and she wakes up feeling fine.  Stomach ulcers were confirmed by upper endoscopy in May 2025, with biopsies showing no intestinal metaplasia. Initially on 80 mg of Protonix  daily, her dose was reduced to 40 mg and then to 20 mg, which led to the return of burning symptoms within two weeks.  She has acid reflux with symptoms including a raspy voice, mucus sensation in her throat, and a throat cough. She has lost 4.5 pounds since late September 2025, attributing some of the loss to increased activity and reduced food intake during a trip to [place].  Her current medication includes Protonix  at 40 mg once daily, taken at 6:30 AM, with breakfast at 7:30 AM. She has not taken Boniva  since May 23, 2024.    01/26/2024 EGD by Dr. Naval (gastric mapping of intestinal metaplasia): Normal esophagus, a few benign fundic gland gastric polyps, previous gastric ulcers had healed, normal  stomach, normal duodenum.  Biopsies showed chronic inactive gastritis with no H. pylori.  Negative for intestinal metaplasia or dysplasia.  Esophageal biopsies negative for EOE.  No repeat surveillance EGD was recommended.  Patient is a candidate for TIF to treat acid reflux.  EGD 11/2021:  No endoscopic esophageal abnormality to explain patient's dysphagia. Esophagus dilated.  Non-bleeding gastric ulcers with no stigmata of bleeding.  A few gastric polyps.  Erythematous duodenopathy.  Gastric biopsy showed chronic gastritis, and localized intestinal metaplasia.  Negative for H. pylori or dysplasia.  Esophageal biopsies normal.  No Barrett's or EOE.    She has had colonoscopies with Dr. Luis, the last in November 2018 at which time the study was normal and he recommended a repeat in 10 years.  She has never had polyps on any of her colonoscopies.  Current Outpatient Medications  Medication Sig Dispense Refill   aspirin 81 MG tablet Take 81 mg by mouth daily.     Calcium  Carbonate-Vitamin D  (CALTRATE 600+D PO) Take 1 tablet by mouth daily.     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 10 tablet 0   estradiol  (ESTRACE ) 0.1 MG/GM vaginal cream PLACE 0.5 G VAGINALLY 2 (TWO) TIMES A WEEK. PLACE 0.5G NIGHTLY FOR TWO WEEKS THEN TWICE A WEEK AFTER 42.5 g 0   metroNIDAZOLE (METROCREAM) 0.75 % cream Apply 1 application  topically as needed.     Multiple Vitamin (MULTIVITAMIN) capsule Take 1 capsule by mouth daily.  pantoprazole  (PROTONIX ) 40 MG tablet Take 1 tablet (40 mg total) by mouth daily. 90 tablet 3   rosuvastatin  (CRESTOR ) 10 MG tablet Take 1 tablet (10 mg total) by mouth daily. 90 tablet 3   sucralfate  (CARAFATE ) 1 g tablet Take 1 tablet (1 g total) by mouth daily before supper. 90 tablet 3   TURMERIC PO Take by mouth.     ibandronate  (BONIVA ) 150 MG tablet TAKE 1 TABLET ONCE A MONTH ON AN EMPTY  STOMACH WITH WATER. STAND OR WALK FOR 30 MINS (Patient not taking: Reported on 07/06/2024) 3 tablet 7   Current Facility-Administered Medications  Medication Dose Route Frequency Provider Last Rate Last Admin   0.9 %  sodium chloride  infusion  500 mL Intravenous Continuous Eda Iha, MD        Allergies as of 07/06/2024   (No Known Allergies)    Past Medical History:  Diagnosis Date   Anxiety    When I fly   Difficult intubation    Gastric ulcer    GERD (gastroesophageal reflux disease)    Hyperlipidemia    Osteoporosis    osteoporosis   Ulcer     Past Surgical History:  Procedure Laterality Date   ABDOMINAL HYSTERECTOMY     APPENDECTOMY  2006   BALLOON DILATION N/A 11/25/2021   Procedure: BALLOON DILATION;  Surgeon: Eda Iha, MD;  Location: [facility];  Service: Gastroenterology;  Laterality: N/A;   BIOPSY  11/25/2021   Procedure: BIOPSY;  Surgeon: Eda Iha, MD;  Location: [facility];  Service: Gastroenterology;;   disc ectomy     ESOPHAGOGASTRODUODENOSCOPY N/A 01/26/2024   Procedure: EGD (ESOPHAGOGASTRODUODENOSCOPY);  Surgeon: Leigh Elspeth SQUIBB, MD;  Location: [facility];  Service: Gastroenterology;  Laterality: N/A;   ESOPHAGOGASTRODUODENOSCOPY (EGD) WITH PROPOFOL  N/A 11/25/2021   Procedure: ESOPHAGOGASTRODUODENOSCOPY (EGD) WITH PROPOFOL ;  Surgeon: Eda Iha, MD;  Location: [facility];  Service: Gastroenterology;  Laterality: N/A;   pheumothorax      PLEURAL SCARIFICATION     TONSILECTOMY/ADENOIDECTOMY WITH MYRINGOTOMY      Review of Systems:    All systems reviewed and negative except where noted in HPI.    Physical Exam:  BP 120/70 (BP Location: Left Arm, Patient Position: Sitting, Cuff Size: Normal)   Pulse 80   Ht 5' 2.75 (1.594 m) Comment: height measured without shoes  Wt 122 lb 4 oz (55.5 kg)   BMI 21.83 kg/m  No LMP recorded. Patient is postmenopausal.  General: Well-nourished,  well-developed in no acute distress.  Neuro: Alert and oriented x 3.  Grossly intact.  Psych: Alert and cooperative, normal mood and affect.   Imaging Studies: No results found.  Labs: CBC    Component Value Date/Time   WBC 6.6 05/16/2024 0937   RBC 4.67 05/16/2024 0937   HGB 13.1 05/16/2024 0937   HCT 39.3 05/16/2024 0937   PLT 345.0 05/16/2024 0937   MCV 84.1 05/16/2024 0937   MCV 83.3 09/09/2016 1331   MCH 27.5 01/09/2023 1455   MCHC 33.3 05/16/2024 0937   RDW 13.9 05/16/2024 0937   LYMPHSABS 2.2 05/16/2024 0937   MONOABS 0.6 05/16/2024 0937   EOSABS 0.1 05/16/2024 0937   BASOSABS 0.0 05/16/2024 0937    CMP     Component Value Date/Time   NA 139 05/16/2024 0937   NA 142 09/09/2016 1312   K 4.3 05/16/2024 0937   CL 101 05/16/2024 0937   CO2 30 05/16/2024 0937   GLUCOSE 92 05/16/2024 0937   BUN 14 05/16/2024  9062   BUN 13 09/09/2016 1312   CREATININE 0.72 05/16/2024 0937   CREATININE 0.80 01/09/2023 1455   CALCIUM  9.8 05/16/2024 0937   PROT 7.7 05/16/2024 0937   PROT 7.6 09/09/2016 1312   ALBUMIN 4.4 05/16/2024 0937   ALBUMIN 4.7 09/09/2016 1312   AST 21 05/16/2024 0937   ALT 16 05/16/2024 0937   ALKPHOS 54 05/16/2024 0937   BILITOT 0.5 05/16/2024 0937   BILITOT 0.2 09/09/2016 1312   GFRNONAA 80 09/09/2016 1312   GFRNONAA >89 12/13/2015 0911   GFRAA 92 09/09/2016 1312   GFRAA >89 12/13/2015 0911       Assessment and Plan:   ANNISHA BAAR is a 73 y.o. y/o female returns for follow-up of:  1.  GERD without esophagitis: Not controlled on low-dose PPI (pantoprazole  20 mg daily) - Increase Pantoprazole  to 40mg  once daily, #90, 3 RF - We discussed adverse side effects of PPIs to include vitamin deficiencies, osteoporosis, renal insufficiency.  Recommend take lowest effective dose of PPI necessary to control acid reflux.   2.  Chronic gastritis (H. pylori negative): Not controlled on low-dose PPI.  Improved on 40 mg pantoprazole  daily. - Rx Carafate   1g, Take 1 tablet before dinner in the evening, #90, 3 RF.  3.  History of gastric ulcers (healed on PPI last EGD 01/2024) - She is advised to avoid all NSAIDs.  4.  Long-term PPI use and history of osteoporosis: Patient is a candidate for TIF procedure to treat reflux if she would like to come off reflux medication. - I discussed TIF procedure and patient declines.  She wants to continue pantoprazole  40 mg daily.  5.  Colon cancer screening - 10-year repeat screening colonoscopy will be due 2028.  Ellouise Console, PA-C  Follow up in 1 year or sooner if recurrent GI symptoms.   "

## 2024-07-06 ENCOUNTER — Encounter: Payer: Self-pay | Admitting: Physician Assistant

## 2024-07-06 ENCOUNTER — Ambulatory Visit: Admitting: Physician Assistant

## 2024-07-06 VITALS — BP 120/70 | HR 80 | Ht 62.75 in | Wt 122.2 lb

## 2024-07-06 DIAGNOSIS — M810 Age-related osteoporosis without current pathological fracture: Secondary | ICD-10-CM

## 2024-07-06 DIAGNOSIS — K219 Gastro-esophageal reflux disease without esophagitis: Secondary | ICD-10-CM

## 2024-07-06 DIAGNOSIS — Z1211 Encounter for screening for malignant neoplasm of colon: Secondary | ICD-10-CM | POA: Diagnosis not present

## 2024-07-06 DIAGNOSIS — Z79899 Other long term (current) drug therapy: Secondary | ICD-10-CM | POA: Diagnosis not present

## 2024-07-06 DIAGNOSIS — K2930 Chronic superficial gastritis without bleeding: Secondary | ICD-10-CM | POA: Diagnosis not present

## 2024-07-06 MED ORDER — SUCRALFATE 1 G PO TABS: 1.0000 g | ORAL_TABLET | Freq: Every day | ORAL | 3 refills | Status: AC

## 2024-07-06 MED ORDER — PANTOPRAZOLE SODIUM 40 MG PO TBEC: 40.0000 mg | DELAYED_RELEASE_TABLET | Freq: Every day | ORAL | 3 refills | Status: AC

## 2024-07-06 NOTE — Patient Instructions (Addendum)
 We have sent the following medications to your pharmacy for you to pick up at your convenience: Pantoprazole  40 mg once daily and Carafate 1 g once daily  Please follow up sooner if symptoms increase or worsen  Due to recent changes in healthcare laws, you may see the results of your imaging and laboratory studies on MyChart before your provider has had a chance to review them.  We understand that in some cases there may be results that are confusing or concerning to you. Not all laboratory results come back in the same time frame and the provider may be waiting for multiple results in order to interpret others.  Please give us  48 hours in order for your provider to thoroughly review all the results before contacting the office for clarification of your results.   Thank you for trusting me with your gastrointestinal care!   Ellouise Console, PA-C _______________________________________________________  If your blood pressure at your visit was 140/90 or greater, please contact your primary care physician to follow up on this.  _______________________________________________________  If you are age 54 or older, your body mass index should be between 23-30. Your Body mass index is 21.83 kg/m. If this is out of the aforementioned range listed, please consider follow up with your Primary Care Provider.  If you are age 52 or younger, your body mass index should be between 19-25. Your Body mass index is 21.83 kg/m. If this is out of the aformentioned range listed, please consider follow up with your Primary Care Provider.   ________________________________________________________  The [facility] providers would like to encourage you to use MYCHART to communicate with providers for non-urgent requests or questions.  Due to long hold times on the telephone, sending your provider a message by [facility] may be a faster and more efficient way to get a response.  Please allow 48 business hours for a response.   Please remember that this is for non-urgent requests.  _______________________________________________________

## 2024-07-07 NOTE — Progress Notes (Signed)
 Agree with assessment / plan as outlined.

## 2024-07-18 ENCOUNTER — Ambulatory Visit (INDEPENDENT_AMBULATORY_CARE_PROVIDER_SITE_OTHER): Payer: PPO

## 2024-07-18 VITALS — Ht 63.0 in | Wt 118.0 lb

## 2024-07-18 DIAGNOSIS — Z0000 Encounter for general adult medical examination without abnormal findings: Secondary | ICD-10-CM

## 2024-07-18 NOTE — Progress Notes (Signed)
 Subjective:   [name] is a 73 y.o. who presents for a Medicare Wellness preventive visit.  As a reminder, Annual Wellness Visits don't include a physical exam, and some assessments may be limited, especially if this visit is performed virtually. We may recommend an in-person follow-up visit with your provider if needed.  Visit Complete: Virtual I connected with  [name] on 07/18/24 by a audio enabled telemedicine application and verified that I am speaking with the correct person using two identifiers.  Patient Location: Home  Provider Location: Office/Clinic  I discussed the limitations of evaluation and management by telemedicine. The patient expressed understanding and agreed to proceed.  Vital Signs: Because this visit was a virtual/telehealth visit, some criteria may be missing or patient reported. Any vitals not documented were not able to be obtained and vitals that have been documented are patient reported.  VideoDeclined- This patient declined Librarian, academic. Therefore the visit was completed with audio only.  Persons Participating in Visit: Patient.  AWV Questionnaire: Yes: Patient Medicare AWV questionnaire was completed by the patient on 07/14/24; I have confirmed that all information answered by patient is correct and no changes since this date.  Cardiac Risk Factors include: advanced age (>66men, >54 women);dyslipidemia     Objective:    Today's Vitals   07/18/24 0903  Weight: 118 lb (53.5 kg)  Height: 5' 3 (1.6 m)   Body mass index is 20.9 kg/m.     07/18/2024    9:08 AM 01/26/2024   10:07 AM 07/13/2023    7:53 AM 03/16/2023   11:13 AM 07/07/2022    8:11 AM 11/25/2021    7:12 AM 10/14/2021    7:37 PM  Advanced Directives  Does Patient Have a Medical Advance Directive? Yes Yes Yes Yes Yes No No  Type of Estate Agent of [place];Living will Healthcare Power of [place];Living will  Healthcare Power of [place];Living will  Healthcare Power of [place];Living will    Does patient want to make changes to medical advance directive?    No - Patient declined     Copy of Healthcare Power of Attorney in Chart? No - copy requested No - copy requested No - copy requested  No - copy requested    Would patient like information on creating a medical advance directive?      No - Patient declined No - Patient declined    Current Medications (verified) Outpatient Encounter Medications as of 07/18/2024  Medication Sig   aspirin 81 MG tablet Take 81 mg by mouth daily.   Calcium  Carbonate-Vitamin D  (CALTRATE 600+D PO) Take 1 tablet by mouth daily.   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   estradiol  (ESTRACE ) 0.1 MG/GM vaginal cream PLACE 0.5 G VAGINALLY 2 (TWO) TIMES A WEEK. PLACE 0.5G NIGHTLY FOR TWO WEEKS THEN TWICE A WEEK AFTER   metroNIDAZOLE (METROCREAM) 0.75 % cream Apply 1 application  topically as needed.   Multiple Vitamin (MULTIVITAMIN) capsule Take 1 capsule by mouth daily.   pantoprazole  (PROTONIX ) 40 MG tablet Take 1 tablet (40 mg total) by mouth daily.   rosuvastatin  (CRESTOR ) 10 MG tablet Take 1 tablet (10 mg total) by mouth daily.   sucralfate (CARAFATE) 1 g tablet Take 1 tablet (1 g total) by mouth daily before supper.   TURMERIC PO Take by mouth.   [DISCONTINUED] ibandronate  (BONIVA ) 150 MG  tablet TAKE 1 TABLET ONCE A MONTH ON AN EMPTY STOMACH WITH WATER. STAND OR WALK FOR 30 MINS (Patient not taking: Reported on 07/06/2024)   Facility-Administered Encounter Medications as of 07/18/2024  Medication   0.9 %  sodium chloride  infusion    Allergies (verified) Patient has no known allergies.   History: Past Medical History:  Diagnosis Date   Anxiety    When I fly   Difficult intubation    Gastric ulcer    GERD (gastroesophageal reflux disease)    Hyperlipidemia     Osteoporosis    osteoporosis   Ulcer    Past Surgical History:  Procedure Laterality Date   ABDOMINAL HYSTERECTOMY     APPENDECTOMY  2006   BALLOON DILATION N/A 11/25/2021   Procedure: BALLOON DILATION;  Surgeon: Eda Iha, MD;  Location: [facility];  Service: Gastroenterology;  Laterality: N/A;   BIOPSY  11/25/2021   Procedure: BIOPSY;  Surgeon: Eda Iha, MD;  Location: [facility];  Service: Gastroenterology;;   disc ectomy     ESOPHAGOGASTRODUODENOSCOPY N/A 01/26/2024   Procedure: EGD (ESOPHAGOGASTRODUODENOSCOPY);  Surgeon: Leigh Elspeth SQUIBB, MD;  Location: [facility];  Service: Gastroenterology;  Laterality: N/A;   ESOPHAGOGASTRODUODENOSCOPY (EGD) WITH PROPOFOL  N/A 11/25/2021   Procedure: ESOPHAGOGASTRODUODENOSCOPY (EGD) WITH PROPOFOL ;  Surgeon: Eda Iha, MD;  Location: [facility];  Service: Gastroenterology;  Laterality: N/A;   pheumothorax      PLEURAL SCARIFICATION     TONSILECTOMY/ADENOIDECTOMY WITH MYRINGOTOMY     Family History  Problem Relation Age of Onset   Heart disease Mother    Cancer Mother        breast cancer   Breast cancer Mother    Cancer Father        myo dysplasia   Hyperlipidemia Brother    Hypertension Brother    Mental illness Brother    Other Brother        pre-diabetes   Diabetes Brother    Cancer Maternal Grandmother        cervical or uterine   Cancer Paternal Grandmother        ovarian   Cancer Paternal Grandfather        prostate   Esophageal cancer Maternal Aunt    Colon cancer Neg Hx    Pancreatic cancer Neg Hx    Stomach cancer Neg Hx    Social History   Socioeconomic History   Marital status: Married    Spouse name: Rozelle Caudle   Number of children: Not on file   Years of education: Not on file   Highest education level: Bachelor's degree (e.g., BA, AB, BS)  Occupational History   Occupation: Retired  Tobacco Use   Smoking status: Former    Current packs/day: 0.00    Average packs/day:  1.5 packs/day for 28.0 years (42.0 ttl pk-yrs)    Types: Cigarettes    Start date: 09/23/1967    Quit date: 09/23/1995    Years since quitting: 28.8   Smokeless tobacco: Never  Vaping Use   Vaping status: Never Used  Substance and Sexual Activity   Alcohol use: No   Drug use: No   Sexual activity: Not Currently  Other Topics Concern   Not on file  Social History Narrative   Married   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 [employer]   Social Drivers of [employer]: Low Risk  (07/14/2024)   Overall Financial Resource Strain (CARDIA)  Difficulty of Paying Living Expenses: Not hard at all  Food Insecurity: No Food Insecurity (07/14/2024)   Hunger Vital Sign    Worried About Running Out of Food in the Last Year: Never true    Ran Out of Food in the Last Year: Never true  Transportation Needs: No Transportation Needs (07/14/2024)   PRAPARE - Administrator, Civil Service (Medical): No    Lack of Transportation (Non-Medical): No  Physical Activity: Sufficiently Active (07/14/2024)   Exercise Vital Sign    Days of Exercise per Week: 6 days    Minutes of Exercise per Session: 60 min  Stress: No Stress Concern Present (07/14/2024)   [employer] of Occupational Health - Occupational Stress Questionnaire    Feeling of Stress: Only a little  Social Connections: Moderately Integrated (07/14/2024)   Social Connection and Isolation Panel    Frequency of Communication with Friends and Family: More than three times a week    Frequency of Social Gatherings with Friends and Family: More than three times a week    Attends Religious Services: Patient declined    Database Administrator or Organizations: Yes    Attends Engineer, Structural: More than 4 times per year    Marital Status: Married    Tobacco Counseling Counseling given: Not Answered    Clinical Intake:  Pre-visit preparation completed:  Yes  Pain : No/denies pain     BMI - recorded: 20.9 Nutritional Status: BMI of 19-24  Normal Nutritional Risks: None Diabetes: No  Lab Results  Component Value Date   HGBA1C 6.0 05/16/2024   HGBA1C 5.8 (H) 01/09/2023   HGBA1C 5.9 12/31/2021     How often do you need to have someone help you when you read instructions, pamphlets, or other written materials from your doctor or pharmacy?: 1 - Never  Interpreter Needed?: No  Information entered by :: Ellouise Haws, LPN   Activities of Daily Living     07/18/2024    9:06 AM  In your present state of health, do you have any difficulty performing the following activities:  Hearing? 0  Comment tinnitus  Vision? 0  Difficulty concentrating or making decisions? 0  Walking or climbing stairs? 0  Dressing or bathing? 0  Doing errands, shopping? 0  Preparing Food and eating ? N  Using the Toilet? N  In the past six months, have you accidently leaked urine? N  Do you have problems with loss of bowel control? N  Managing your Medications? N  Managing your Finances? N  Housekeeping or managing your Housekeeping? N    Patient Care Team: Job Lukes, [place] as PCP - General (Physician Assistant) Lilton Legions, DO as Consulting Physician (Obstetrics and Gynecology) Cary Doffing, MD as Consulting Physician (Dermatology)  I have updated your Care Teams any recent Medical Services you may have received from other providers in the past year.     Assessment:   This is a routine wellness examination for Katee.  Hearing/Vision screen Hearing Screening - Comments:: Pt denies any hearing issues tinnitus at times  Vision Screening - Comments:: Wears rx glasses - up to date with routine eye exams with Dr Charmayne Morita ophthalmology    Goals Addressed   None    Depression Screen     07/18/2024    9:07 AM 05/16/2024    8:47 AM 07/13/2023    7:51 AM 01/09/2023    1:42 PM 07/07/2022    8:10 AM 06/20/2021  3:22 PM  06/20/2021    3:18 PM  PHQ 2/9 Scores  PHQ - 2 Score 0 0 0 0 0 0 0    Fall Risk     07/18/2024    9:09 AM 05/16/2024    8:47 AM 07/06/2023   11:46 AM 07/07/2022    8:12 AM 06/20/2021    3:21 PM  Fall Risk   Falls in the past year? 0 0 0 0 0  Number falls in past yr: 0 0  0 0  Injury with Fall? 0 0  0 0  Risk for fall due to : No Fall Risks  Impaired vision Impaired vision No Fall Risks  Follow up Falls prevention discussed Falls evaluation completed Falls prevention discussed Falls prevention discussed       Data saved with a previous flowsheet row definition    MEDICARE RISK AT HOME:  Medicare Risk at Home Any stairs in or around the home?: No If so, are there any without handrails?: No Home free of loose throw rugs in walkways, pet beds, electrical cords, etc?: Yes Adequate lighting in your home to reduce risk of falls?: Yes Life alert?: No Use of a cane, walker or w/c?: No Grab bars in the bathroom?: No Shower chair or bench in shower?: No Elevated toilet seat or a handicapped toilet?: No  TIMED UP AND GO:  Was the test performed?  No  Cognitive Function: 6CIT completed        07/18/2024    9:09 AM 07/13/2023    7:54 AM 07/07/2022    8:14 AM 06/20/2021    3:23 PM  6CIT Screen  What Year? 0 points 0 points 0 points 0 points  What month? 0 points 0 points 0 points 0 points  What time? 0 points 0 points 0 points 0 points  Count back from 20 0 points 0 points 0 points 0 points  Months in reverse 0 points 0 points 0 points 0 points  Repeat phrase 0 points 0 points 0 points 0 points  Total Score 0 points 0 points 0 points 0 points    Immunizations Immunization History  Administered Date(s) Administered   Fluad Quad(high Dose 65+) 06/04/2021, 08/07/2022   INFLUENZA, HIGH DOSE SEASONAL PF 06/30/2018, 06/17/2020, 09/18/2023   Influenza Inj Mdck Quad With Preservative 06/22/2017, 06/22/2018   Influenza Split 06/07/2012   Influenza, Seasonal, Injecte, Preservative  Fre 08/22/2015   Influenza,inj,Quad PF,6+ Mos 08/14/2014, 06/18/2017   Influenza,inj,quad, With Preservative 06/25/2016   Influenza-Unspecified 07/24/2015, 05/23/2016, 06/22/2016, 08/07/2022   Moderna Covid-19 Fall Seasonal Vaccine 27yrs & older 08/07/2022, 05/27/2024   Moderna Sars-Covid-2 Vaccination 04/06/2023   PFIZER Comirnaty(Gray Top)Covid-19 Tri-Sucrose Vaccine 01/21/2021   PFIZER(Purple Top)SARS-COV-2 Vaccination 10/12/2019, 11/02/2019, 06/29/2020   Pfizer Covid-19 Vaccine Bivalent Booster 76yrs & up 09/02/2021   Pfizer(Comirnaty)Fall Seasonal Vaccine 12 years and older 08/07/2022   Pneumococcal Conjugate-13 06/30/2018   Pneumococcal Polysaccharide-23 03/21/2020   Tdap 10/09/2015   Zoster Recombinant(Shingrix) 07/09/2021, 11/14/2021    Screening Tests Health Maintenance  Topic Date Due   Influenza Vaccine  12/20/2024 (Originally 04/22/2024)   COVID-19 Vaccine (9 - Pfizer risk 2025-26 season) 11/24/2024   Medicare Annual Wellness (AWV)  07/18/2025   DTaP/Tdap/Td (2 - Td or Tdap) 10/08/2025   Mammogram  05/20/2026   DEXA SCAN  04/30/2027   Colonoscopy  11/21/2027   Pneumococcal Vaccine: 50+ Years  Completed   Hepatitis C Screening  Completed   Zoster Vaccines- Shingrix  Completed   Meningococcal B Vaccine  Aged  Out    Health Maintenance Items Addressed: See Nurse Notes at the end of this note  Additional Screening:  Vision Screening: Recommended annual ophthalmology exams for early detection of glaucoma and other disorders of the eye. Is the patient up to date with their annual eye exam?  Yes  Who is the provider or what is the name of the office in which the patient attends annual eye exams? Dr  Charmayne   Dental Screening: Recommended annual dental exams for proper oral hygiene  Community Resource Referral / Chronic Care Management: CRR required this visit?  No   CCM required this visit?  No   Plan:    I have personally reviewed and noted the following in the  patient's chart:   Medical and social history Use of alcohol, tobacco or illicit drugs  Current medications and supplements including opioid prescriptions. Patient is not currently taking opioid prescriptions. Functional ability and status Nutritional status Physical activity Advanced directives List of other physicians Hospitalizations, surgeries, and ER visits in previous 12 months Vitals Screenings to include cognitive, depression, and falls Referrals and appointments  In addition, I have reviewed and discussed with patient certain preventive protocols, quality metrics, and best practice recommendations. A written personalized care plan for preventive services as well as general preventive health recommendations were provided to patient.   Ellouise VEAR Haws, LPN   [id number]   After Visit Summary: (MyChart) Due to this being a telephonic visit, the after visit summary with patients personalized plan was offered to patient via MyChart   Notes: Nothing significant to report at this time.

## 2024-07-18 NOTE — Patient Instructions (Signed)
 [name],  Thank you for taking the time for your Medicare Wellness Visit. I appreciate your continued commitment to your health goals. Please review the care plan we discussed, and feel free to reach out if I can assist you further.  Medicare recommends these wellness visits once per year to help you and your care team stay ahead of potential health issues. These visits are designed to focus on prevention, allowing your provider to concentrate on managing your acute and chronic conditions during your regular appointments.  Please note that Annual Wellness Visits do not include a physical exam. Some assessments may be limited, especially if the visit was conducted virtually. If needed, we may recommend a separate in-person follow-up with your provider.  Ongoing Care Seeing your primary care provider every 3 to 6 months helps us  monitor your health and provide consistent, personalized care.   Referrals If a referral was made during today's visit and you haven't received any updates within two weeks, please contact the referred provider directly to check on the status.  Recommended Screenings:  Health Maintenance  Topic Date Due   Flu Shot  12/20/2024*   COVID-19 Vaccine (9 - Pfizer risk 2025-26 season) 11/24/2024   Medicare Annual Wellness Visit  07/18/2025   DTaP/Tdap/Td vaccine (2 - Td or Tdap) 10/08/2025   Breast Cancer Screening  05/20/2026   DEXA scan (bone density measurement)  04/30/2027   Colon Cancer Screening  11/21/2027   Pneumococcal Vaccine for age over 52  Completed   Hepatitis C Screening  Completed   Zoster (Shingles) Vaccine  Completed   Meningitis B Vaccine  Aged Out  *Topic was postponed. The date shown is not the original due date.       07/18/2024    9:08 AM  Advanced Directives  Does Patient Have a Medical Advance Directive? Yes  Type of Estate Agent of [place];Living will  Copy of Healthcare Power of Attorney in Chart? No - copy  requested   Advance Care Planning is important because it: Ensures you receive medical care that aligns with your values, goals, and preferences. Provides guidance to your family and loved ones, reducing the emotional burden of decision-making during critical moments.  Vision: Annual vision screenings are recommended for early detection of glaucoma, cataracts, and diabetic retinopathy. These exams can also reveal signs of chronic conditions such as diabetes and high blood pressure.  Dental: Annual dental screenings help detect early signs of oral cancer, gum disease, and other conditions linked to overall health, including heart disease and diabetes.  Please see the attached documents for additional preventive care recommendations.

## 2024-08-11 ENCOUNTER — Other Ambulatory Visit: Payer: Self-pay

## 2024-08-26 DIAGNOSIS — M5386 Other specified dorsopathies, lumbar region: Secondary | ICD-10-CM | POA: Diagnosis not present

## 2024-08-26 DIAGNOSIS — M9903 Segmental and somatic dysfunction of lumbar region: Secondary | ICD-10-CM | POA: Diagnosis not present

## 2024-08-26 DIAGNOSIS — M9905 Segmental and somatic dysfunction of pelvic region: Secondary | ICD-10-CM | POA: Diagnosis not present

## 2024-08-26 DIAGNOSIS — M9904 Segmental and somatic dysfunction of sacral region: Secondary | ICD-10-CM | POA: Diagnosis not present

## 2024-08-26 DIAGNOSIS — M5417 Radiculopathy, lumbosacral region: Secondary | ICD-10-CM | POA: Diagnosis not present

## 2024-08-26 DIAGNOSIS — M51372 Other intervertebral disc degeneration, lumbosacral region with discogenic back pain and lower extremity pain: Secondary | ICD-10-CM | POA: Diagnosis not present

## 2024-08-26 DIAGNOSIS — S29012A Strain of muscle and tendon of back wall of thorax, initial encounter: Secondary | ICD-10-CM | POA: Diagnosis not present

## 2024-08-26 DIAGNOSIS — M9902 Segmental and somatic dysfunction of thoracic region: Secondary | ICD-10-CM | POA: Diagnosis not present

## 2024-08-30 DIAGNOSIS — S29012A Strain of muscle and tendon of back wall of thorax, initial encounter: Secondary | ICD-10-CM | POA: Diagnosis not present

## 2024-08-30 DIAGNOSIS — M9904 Segmental and somatic dysfunction of sacral region: Secondary | ICD-10-CM | POA: Diagnosis not present

## 2024-08-30 DIAGNOSIS — M51372 Other intervertebral disc degeneration, lumbosacral region with discogenic back pain and lower extremity pain: Secondary | ICD-10-CM | POA: Diagnosis not present

## 2024-08-30 DIAGNOSIS — M9905 Segmental and somatic dysfunction of pelvic region: Secondary | ICD-10-CM | POA: Diagnosis not present

## 2024-08-30 DIAGNOSIS — M9903 Segmental and somatic dysfunction of lumbar region: Secondary | ICD-10-CM | POA: Diagnosis not present

## 2024-08-30 DIAGNOSIS — M9902 Segmental and somatic dysfunction of thoracic region: Secondary | ICD-10-CM | POA: Diagnosis not present

## 2024-08-30 DIAGNOSIS — M5417 Radiculopathy, lumbosacral region: Secondary | ICD-10-CM | POA: Diagnosis not present

## 2024-08-30 DIAGNOSIS — M5386 Other specified dorsopathies, lumbar region: Secondary | ICD-10-CM | POA: Diagnosis not present

## 2024-09-29 ENCOUNTER — Encounter: Payer: Self-pay | Admitting: Physician Assistant

## 2024-09-29 DIAGNOSIS — M810 Age-related osteoporosis without current pathological fracture: Secondary | ICD-10-CM

## 2024-09-30 MED ORDER — DENOSUMAB 60 MG/ML ~~LOC~~ SOSY: 60.0000 mg | PREFILLED_SYRINGE | Freq: Once | SUBCUTANEOUS | Status: AC

## 2024-10-03 ENCOUNTER — Encounter: Payer: Self-pay | Admitting: *Deleted

## 2024-10-04 ENCOUNTER — Telehealth: Payer: Self-pay

## 2024-10-04 ENCOUNTER — Other Ambulatory Visit (HOSPITAL_COMMUNITY): Payer: Self-pay

## 2024-10-04 NOTE — Telephone Encounter (Signed)
 Prolia  VOB initiated via [email or web address]  Next Prolia  inj DUE: NEW START

## 2024-10-05 ENCOUNTER — Other Ambulatory Visit (HOSPITAL_COMMUNITY): Payer: Self-pay

## 2024-10-05 NOTE — Telephone Encounter (Signed)
 Pt ready for scheduling for PROLIA  on or after : 10/05/24  Option# 1: Buy/Bill (Office supplied medication)  Out-of-pocket cost due at time of clinic visit: $352  Number of injection/visits approved: ---  Primary: [employer] Prolia  co-insurance: 20% Admin fee co-insurance: 0%  Secondary: --- Prolia  co-insurance:  Admin fee co-insurance:   Medical Benefit Details: Date Benefits were checked: 10/04/24 Deductible: NO/ Coinsurance: 20%/ Admin Fee: 0%  Prior Auth: N/A PA# Expiration Date:   # of doses approved: ----------------------------------------------------------------------- Option# 2- Med Obtained from pharmacy: [facility] PREFERRED FOR PHARMACY BENEFIT  Pharmacy benefit: Copay $655.84 (Paid to pharmacy) Admin Fee: 0% (Pay at clinic)  Prior Auth: N/A PA# Expiration Date:   # of doses approved:   If patient wants fill through the pharmacy benefit please send prescription to: [facility], and include estimated need by date in rx notes. Pharmacy will ship medication directly to the office.  Patient NOT eligible for Prolia  Copay Card. Copay Card can make patient's cost as little as $25. Link to apply: [email or web address]://www.[email or web address]  ** This summary of benefits is an estimation of the patient's out-of-pocket cost. Exact cost may very based on individual plan coverage.

## 2024-10-12 NOTE — Telephone Encounter (Signed)
 Lucie, please see message from 10/04/2024 about co-pay of $352.00 foProlia .

## 2024-10-20 ENCOUNTER — Telehealth: Payer: Self-pay | Admitting: *Deleted

## 2024-10-20 NOTE — Telephone Encounter (Signed)
"  °  Pt ready for scheduling for PROLIA  on or after : 10/05/24   Out-of-pocket cost due at time of clinic visit: $352      "

## 2024-10-21 NOTE — Telephone Encounter (Signed)
 Scheduled 11/01/24 for Prolia 

## 2024-10-25 NOTE — Telephone Encounter (Signed)
 PA was done, pt has out pocket expense. Schedulers are trying to get in touch with her.

## 2024-11-01 ENCOUNTER — Ambulatory Visit

## 2025-05-19 ENCOUNTER — Encounter: Admitting: Physician Assistant

## 2025-07-24 ENCOUNTER — Ambulatory Visit
